# Patient Record
Sex: Female | Born: 1955 | Race: Black or African American | Hispanic: No | Smoking: Never smoker
Health system: Southern US, Community
[De-identification: ages and names within clinical notes are randomized; demographics above are authoritative.]

## PROBLEM LIST (undated history)

## (undated) DIAGNOSIS — E119 Type 2 diabetes mellitus without complications: Secondary | ICD-10-CM

## (undated) DIAGNOSIS — E785 Hyperlipidemia, unspecified: Secondary | ICD-10-CM

## (undated) DIAGNOSIS — I10 Essential (primary) hypertension: Secondary | ICD-10-CM

## (undated) HISTORY — PX: OTHER SURGICAL HISTORY: SHX169

## (undated) HISTORY — DX: Essential (primary) hypertension: I10

## (undated) HISTORY — DX: Hyperlipidemia, unspecified: E78.5

## (undated) HISTORY — PX: TUBAL LIGATION: SHX77

## (undated) HISTORY — DX: Type 2 diabetes mellitus without complications: E11.9

## (undated) HISTORY — PX: ABDOMINAL HYSTERECTOMY: SHX81

---

## 1998-01-11 ENCOUNTER — Emergency Department (HOSPITAL_COMMUNITY): Admission: EM | Admit: 1998-01-11 | Discharge: 1998-01-11 | Payer: Self-pay | Admitting: Emergency Medicine

## 1999-08-23 ENCOUNTER — Encounter: Payer: Self-pay | Admitting: Emergency Medicine

## 1999-08-23 ENCOUNTER — Inpatient Hospital Stay (HOSPITAL_COMMUNITY): Admission: EM | Admit: 1999-08-23 | Discharge: 1999-08-25 | Payer: Self-pay | Admitting: Emergency Medicine

## 1999-08-25 ENCOUNTER — Encounter: Payer: Self-pay | Admitting: Cardiovascular Disease

## 2000-10-19 ENCOUNTER — Emergency Department (HOSPITAL_COMMUNITY): Admission: EM | Admit: 2000-10-19 | Discharge: 2000-10-19 | Payer: Self-pay | Admitting: Emergency Medicine

## 2000-10-19 ENCOUNTER — Encounter: Payer: Self-pay | Admitting: Emergency Medicine

## 2000-12-18 ENCOUNTER — Encounter: Admission: RE | Admit: 2000-12-18 | Discharge: 2000-12-18 | Payer: Self-pay | Admitting: Family Medicine

## 2001-01-01 ENCOUNTER — Encounter: Admission: RE | Admit: 2001-01-01 | Discharge: 2001-01-01 | Payer: Self-pay | Admitting: Family Medicine

## 2001-01-31 ENCOUNTER — Encounter: Admission: RE | Admit: 2001-01-31 | Discharge: 2001-01-31 | Payer: Self-pay | Admitting: Family Medicine

## 2001-02-13 ENCOUNTER — Encounter: Admission: RE | Admit: 2001-02-13 | Discharge: 2001-02-13 | Payer: Self-pay | Admitting: Family Medicine

## 2001-03-17 ENCOUNTER — Encounter: Admission: RE | Admit: 2001-03-17 | Discharge: 2001-03-17 | Payer: Self-pay | Admitting: Family Medicine

## 2001-04-14 ENCOUNTER — Encounter: Admission: RE | Admit: 2001-04-14 | Discharge: 2001-04-14 | Payer: Self-pay | Admitting: Family Medicine

## 2001-06-05 ENCOUNTER — Encounter: Admission: RE | Admit: 2001-06-05 | Discharge: 2001-06-05 | Payer: Self-pay | Admitting: Family Medicine

## 2001-07-01 ENCOUNTER — Encounter: Admission: RE | Admit: 2001-07-01 | Discharge: 2001-07-01 | Payer: Self-pay | Admitting: Family Medicine

## 2001-07-02 ENCOUNTER — Encounter: Admission: RE | Admit: 2001-07-02 | Discharge: 2001-07-02 | Payer: Self-pay | Admitting: Family Medicine

## 2001-07-28 ENCOUNTER — Encounter: Admission: RE | Admit: 2001-07-28 | Discharge: 2001-07-28 | Payer: Self-pay | Admitting: Family Medicine

## 2001-08-01 ENCOUNTER — Encounter: Admission: RE | Admit: 2001-08-01 | Discharge: 2001-08-01 | Payer: Self-pay | Admitting: Family Medicine

## 2001-08-05 ENCOUNTER — Encounter: Admission: RE | Admit: 2001-08-05 | Discharge: 2001-08-05 | Payer: Self-pay | Admitting: *Deleted

## 2001-08-05 ENCOUNTER — Encounter: Payer: Self-pay | Admitting: *Deleted

## 2001-09-25 ENCOUNTER — Encounter: Admission: RE | Admit: 2001-09-25 | Discharge: 2001-09-25 | Payer: Self-pay | Admitting: Family Medicine

## 2001-10-21 ENCOUNTER — Encounter: Admission: RE | Admit: 2001-10-21 | Discharge: 2001-10-21 | Payer: Self-pay | Admitting: Family Medicine

## 2001-11-12 ENCOUNTER — Encounter: Admission: RE | Admit: 2001-11-12 | Discharge: 2001-11-12 | Payer: Self-pay | Admitting: Family Medicine

## 2001-12-16 ENCOUNTER — Encounter: Admission: RE | Admit: 2001-12-16 | Discharge: 2001-12-16 | Payer: Self-pay | Admitting: Family Medicine

## 2001-12-19 ENCOUNTER — Encounter: Payer: Self-pay | Admitting: Sports Medicine

## 2001-12-19 ENCOUNTER — Encounter: Admission: RE | Admit: 2001-12-19 | Discharge: 2001-12-19 | Payer: Self-pay | Admitting: Sports Medicine

## 2001-12-24 ENCOUNTER — Encounter: Admission: RE | Admit: 2001-12-24 | Discharge: 2001-12-24 | Payer: Self-pay | Admitting: Family Medicine

## 2002-01-27 ENCOUNTER — Encounter: Admission: RE | Admit: 2002-01-27 | Discharge: 2002-01-27 | Payer: Self-pay | Admitting: Family Medicine

## 2002-04-01 ENCOUNTER — Encounter: Admission: RE | Admit: 2002-04-01 | Discharge: 2002-04-01 | Payer: Self-pay | Admitting: Family Medicine

## 2002-04-13 ENCOUNTER — Emergency Department (HOSPITAL_COMMUNITY): Admission: EM | Admit: 2002-04-13 | Discharge: 2002-04-13 | Payer: Self-pay | Admitting: Emergency Medicine

## 2002-04-16 ENCOUNTER — Encounter: Payer: Self-pay | Admitting: Emergency Medicine

## 2002-04-16 ENCOUNTER — Emergency Department (HOSPITAL_COMMUNITY): Admission: EM | Admit: 2002-04-16 | Discharge: 2002-04-16 | Payer: Self-pay | Admitting: Emergency Medicine

## 2002-04-21 ENCOUNTER — Encounter: Admission: RE | Admit: 2002-04-21 | Discharge: 2002-04-21 | Payer: Self-pay | Admitting: Family Medicine

## 2002-04-21 ENCOUNTER — Ambulatory Visit (HOSPITAL_COMMUNITY): Admission: RE | Admit: 2002-04-21 | Discharge: 2002-04-21 | Payer: Self-pay | Admitting: Family Medicine

## 2002-05-06 ENCOUNTER — Encounter: Admission: RE | Admit: 2002-05-06 | Discharge: 2002-05-06 | Payer: Self-pay | Admitting: Family Medicine

## 2002-05-11 ENCOUNTER — Encounter: Admission: RE | Admit: 2002-05-11 | Discharge: 2002-05-11 | Payer: Self-pay | Admitting: Family Medicine

## 2002-05-27 ENCOUNTER — Encounter: Admission: RE | Admit: 2002-05-27 | Discharge: 2002-05-27 | Payer: Self-pay | Admitting: Family Medicine

## 2002-07-30 ENCOUNTER — Encounter: Admission: RE | Admit: 2002-07-30 | Discharge: 2002-07-30 | Payer: Self-pay | Admitting: Family Medicine

## 2002-08-10 ENCOUNTER — Encounter: Admission: RE | Admit: 2002-08-10 | Discharge: 2002-11-08 | Payer: Self-pay | Admitting: Family Medicine

## 2002-09-25 ENCOUNTER — Emergency Department (HOSPITAL_COMMUNITY): Admission: EM | Admit: 2002-09-25 | Discharge: 2002-09-25 | Payer: Self-pay

## 2002-09-28 ENCOUNTER — Encounter: Admission: RE | Admit: 2002-09-28 | Discharge: 2002-09-28 | Payer: Self-pay | Admitting: Family Medicine

## 2002-10-30 ENCOUNTER — Encounter: Admission: RE | Admit: 2002-10-30 | Discharge: 2002-10-30 | Payer: Self-pay | Admitting: Family Medicine

## 2002-11-05 ENCOUNTER — Encounter: Admission: RE | Admit: 2002-11-05 | Discharge: 2002-11-05 | Payer: Self-pay | Admitting: Family Medicine

## 2002-12-15 ENCOUNTER — Encounter: Admission: RE | Admit: 2002-12-15 | Discharge: 2002-12-15 | Payer: Self-pay | Admitting: Family Medicine

## 2002-12-24 ENCOUNTER — Encounter: Admission: RE | Admit: 2002-12-24 | Discharge: 2002-12-24 | Payer: Self-pay | Admitting: Sports Medicine

## 2002-12-24 ENCOUNTER — Encounter: Payer: Self-pay | Admitting: Sports Medicine

## 2002-12-26 ENCOUNTER — Emergency Department (HOSPITAL_COMMUNITY): Admission: EM | Admit: 2002-12-26 | Discharge: 2002-12-26 | Payer: Self-pay | Admitting: Emergency Medicine

## 2003-02-18 ENCOUNTER — Encounter: Admission: RE | Admit: 2003-02-18 | Discharge: 2003-02-18 | Payer: Self-pay | Admitting: Family Medicine

## 2003-10-18 ENCOUNTER — Encounter: Admission: RE | Admit: 2003-10-18 | Discharge: 2003-10-18 | Payer: Self-pay | Admitting: Family Medicine

## 2003-11-10 ENCOUNTER — Encounter: Admission: RE | Admit: 2003-11-10 | Discharge: 2003-11-10 | Payer: Self-pay | Admitting: Family Medicine

## 2003-12-22 ENCOUNTER — Encounter: Admission: RE | Admit: 2003-12-22 | Discharge: 2003-12-22 | Payer: Self-pay | Admitting: Family Medicine

## 2003-12-31 ENCOUNTER — Encounter: Admission: RE | Admit: 2003-12-31 | Discharge: 2003-12-31 | Payer: Self-pay | Admitting: Family Medicine

## 2004-02-21 ENCOUNTER — Ambulatory Visit: Payer: Self-pay | Admitting: Family Medicine

## 2004-02-29 ENCOUNTER — Encounter: Admission: RE | Admit: 2004-02-29 | Discharge: 2004-02-29 | Payer: Self-pay | Admitting: Sports Medicine

## 2004-03-26 ENCOUNTER — Emergency Department (HOSPITAL_COMMUNITY): Admission: EM | Admit: 2004-03-26 | Discharge: 2004-03-26 | Payer: Self-pay | Admitting: Internal Medicine

## 2004-05-09 ENCOUNTER — Ambulatory Visit: Payer: Self-pay | Admitting: Family Medicine

## 2004-05-22 ENCOUNTER — Ambulatory Visit: Payer: Self-pay | Admitting: Family Medicine

## 2004-07-18 ENCOUNTER — Ambulatory Visit: Payer: Self-pay | Admitting: Family Medicine

## 2004-07-22 ENCOUNTER — Emergency Department (HOSPITAL_COMMUNITY): Admission: AD | Admit: 2004-07-22 | Discharge: 2004-07-22 | Payer: Self-pay | Admitting: Family Medicine

## 2004-08-08 ENCOUNTER — Ambulatory Visit: Payer: Self-pay | Admitting: Sports Medicine

## 2004-09-01 ENCOUNTER — Ambulatory Visit: Payer: Self-pay | Admitting: Sports Medicine

## 2004-09-21 ENCOUNTER — Ambulatory Visit: Payer: Self-pay | Admitting: Sports Medicine

## 2004-11-15 ENCOUNTER — Ambulatory Visit: Payer: Self-pay | Admitting: Family Medicine

## 2005-02-21 ENCOUNTER — Ambulatory Visit: Payer: Self-pay | Admitting: Family Medicine

## 2005-03-04 ENCOUNTER — Emergency Department (HOSPITAL_COMMUNITY): Admission: EM | Admit: 2005-03-04 | Discharge: 2005-03-04 | Payer: Self-pay | Admitting: Family Medicine

## 2005-03-21 ENCOUNTER — Ambulatory Visit: Payer: Self-pay | Admitting: Family Medicine

## 2005-03-21 ENCOUNTER — Encounter: Admission: RE | Admit: 2005-03-21 | Discharge: 2005-03-21 | Payer: Self-pay | Admitting: Sports Medicine

## 2005-03-26 ENCOUNTER — Ambulatory Visit: Payer: Self-pay | Admitting: Sports Medicine

## 2005-03-29 ENCOUNTER — Ambulatory Visit: Payer: Self-pay | Admitting: Family Medicine

## 2005-04-18 ENCOUNTER — Ambulatory Visit: Payer: Self-pay | Admitting: Family Medicine

## 2005-07-05 ENCOUNTER — Ambulatory Visit: Payer: Self-pay | Admitting: Family Medicine

## 2005-07-12 ENCOUNTER — Encounter (INDEPENDENT_AMBULATORY_CARE_PROVIDER_SITE_OTHER): Payer: Self-pay | Admitting: *Deleted

## 2005-07-12 LAB — CONVERTED CEMR LAB: Pap Smear: NORMAL

## 2005-07-19 ENCOUNTER — Encounter: Admission: RE | Admit: 2005-07-19 | Discharge: 2005-07-19 | Payer: Self-pay | Admitting: Sports Medicine

## 2005-08-08 ENCOUNTER — Ambulatory Visit: Payer: Self-pay | Admitting: Sports Medicine

## 2005-10-10 ENCOUNTER — Ambulatory Visit: Payer: Self-pay | Admitting: Sports Medicine

## 2005-11-27 ENCOUNTER — Ambulatory Visit: Payer: Self-pay | Admitting: Sports Medicine

## 2006-01-08 ENCOUNTER — Emergency Department (HOSPITAL_COMMUNITY): Admission: EM | Admit: 2006-01-08 | Discharge: 2006-01-08 | Payer: Self-pay | Admitting: Emergency Medicine

## 2006-01-29 ENCOUNTER — Ambulatory Visit: Payer: Self-pay | Admitting: Family Medicine

## 2006-03-26 ENCOUNTER — Ambulatory Visit: Payer: Self-pay | Admitting: Family Medicine

## 2006-03-27 ENCOUNTER — Ambulatory Visit (HOSPITAL_COMMUNITY): Admission: RE | Admit: 2006-03-27 | Discharge: 2006-03-27 | Payer: Self-pay | Admitting: Family Medicine

## 2006-04-08 ENCOUNTER — Ambulatory Visit: Payer: Self-pay | Admitting: Sports Medicine

## 2006-07-02 ENCOUNTER — Ambulatory Visit (HOSPITAL_COMMUNITY): Admission: RE | Admit: 2006-07-02 | Discharge: 2006-07-02 | Payer: Self-pay | Admitting: Family Medicine

## 2006-07-02 ENCOUNTER — Ambulatory Visit: Payer: Self-pay | Admitting: Family Medicine

## 2006-08-05 ENCOUNTER — Ambulatory Visit: Payer: Self-pay | Admitting: Family Medicine

## 2006-08-08 DIAGNOSIS — E119 Type 2 diabetes mellitus without complications: Secondary | ICD-10-CM | POA: Insufficient documentation

## 2006-08-08 DIAGNOSIS — I10 Essential (primary) hypertension: Secondary | ICD-10-CM | POA: Insufficient documentation

## 2006-08-08 DIAGNOSIS — E669 Obesity, unspecified: Secondary | ICD-10-CM | POA: Insufficient documentation

## 2006-08-08 DIAGNOSIS — G43909 Migraine, unspecified, not intractable, without status migrainosus: Secondary | ICD-10-CM | POA: Insufficient documentation

## 2006-08-09 ENCOUNTER — Encounter (INDEPENDENT_AMBULATORY_CARE_PROVIDER_SITE_OTHER): Payer: Self-pay | Admitting: *Deleted

## 2006-08-19 ENCOUNTER — Telehealth (INDEPENDENT_AMBULATORY_CARE_PROVIDER_SITE_OTHER): Payer: Self-pay | Admitting: Family Medicine

## 2006-11-25 ENCOUNTER — Telehealth (INDEPENDENT_AMBULATORY_CARE_PROVIDER_SITE_OTHER): Payer: Self-pay | Admitting: *Deleted

## 2006-12-12 ENCOUNTER — Ambulatory Visit: Payer: Self-pay | Admitting: Family Medicine

## 2006-12-12 ENCOUNTER — Encounter (INDEPENDENT_AMBULATORY_CARE_PROVIDER_SITE_OTHER): Payer: Self-pay | Admitting: Family Medicine

## 2006-12-12 ENCOUNTER — Telehealth (INDEPENDENT_AMBULATORY_CARE_PROVIDER_SITE_OTHER): Payer: Self-pay | Admitting: Family Medicine

## 2006-12-12 DIAGNOSIS — M216X9 Other acquired deformities of unspecified foot: Secondary | ICD-10-CM | POA: Insufficient documentation

## 2006-12-12 LAB — CONVERTED CEMR LAB
AST: 12 units/L (ref 0–37)
Alkaline Phosphatase: 54 units/L (ref 39–117)
CO2: 29 meq/L (ref 19–32)
Calcium: 9.3 mg/dL (ref 8.4–10.5)
Chloride: 103 meq/L (ref 96–112)
Creatinine, Ser: 0.77 mg/dL (ref 0.40–1.20)
Glucose, Bld: 129 mg/dL — ABNORMAL HIGH (ref 70–99)
LDL Cholesterol: 113 mg/dL — ABNORMAL HIGH (ref 0–99)
Potassium: 4.1 meq/L (ref 3.5–5.3)
Sodium: 139 meq/L (ref 135–145)
Total Bilirubin: 0.8 mg/dL (ref 0.3–1.2)
Total Protein: 7.6 g/dL (ref 6.0–8.3)
VLDL: 14 mg/dL (ref 0–40)

## 2007-01-15 ENCOUNTER — Ambulatory Visit: Payer: Self-pay | Admitting: Family Medicine

## 2007-01-29 ENCOUNTER — Ambulatory Visit: Payer: Self-pay | Admitting: Family Medicine

## 2007-01-30 ENCOUNTER — Encounter (INDEPENDENT_AMBULATORY_CARE_PROVIDER_SITE_OTHER): Payer: Self-pay | Admitting: Family Medicine

## 2007-02-18 ENCOUNTER — Emergency Department (HOSPITAL_COMMUNITY): Admission: EM | Admit: 2007-02-18 | Discharge: 2007-02-18 | Payer: Self-pay | Admitting: Family Medicine

## 2007-03-13 ENCOUNTER — Ambulatory Visit: Payer: Self-pay | Admitting: Family Medicine

## 2007-03-13 DIAGNOSIS — E785 Hyperlipidemia, unspecified: Secondary | ICD-10-CM | POA: Insufficient documentation

## 2007-04-24 ENCOUNTER — Encounter (INDEPENDENT_AMBULATORY_CARE_PROVIDER_SITE_OTHER): Payer: Self-pay | Admitting: Family Medicine

## 2007-04-24 LAB — CONVERTED CEMR LAB: LDL Goal: 100 mg/dL

## 2007-05-22 ENCOUNTER — Ambulatory Visit: Payer: Self-pay | Admitting: Family Medicine

## 2007-05-22 ENCOUNTER — Encounter (INDEPENDENT_AMBULATORY_CARE_PROVIDER_SITE_OTHER): Payer: Self-pay | Admitting: Family Medicine

## 2007-05-22 LAB — CONVERTED CEMR LAB
ALT: 9 units/L (ref 0–35)
AST: 13 units/L (ref 0–37)
Direct LDL: 80 mg/dL
Glucose, Bld: 101 mg/dL — ABNORMAL HIGH (ref 70–99)
Hgb A1c MFr Bld: 6.7 %
LDL Cholesterol: 80 mg/dL
Microalbumin U total vol: NEGATIVE mg/L
Potassium: 4.4 meq/L (ref 3.5–5.3)
Total Protein: 7.4 g/dL (ref 6.0–8.3)

## 2007-05-23 ENCOUNTER — Encounter (INDEPENDENT_AMBULATORY_CARE_PROVIDER_SITE_OTHER): Payer: Self-pay | Admitting: Family Medicine

## 2007-06-02 ENCOUNTER — Telehealth (INDEPENDENT_AMBULATORY_CARE_PROVIDER_SITE_OTHER): Payer: Self-pay | Admitting: Family Medicine

## 2007-06-11 ENCOUNTER — Ambulatory Visit: Payer: Self-pay | Admitting: Family Medicine

## 2007-06-26 ENCOUNTER — Emergency Department (HOSPITAL_COMMUNITY): Admission: EM | Admit: 2007-06-26 | Discharge: 2007-06-26 | Payer: Self-pay | Admitting: Emergency Medicine

## 2007-08-25 ENCOUNTER — Encounter (INDEPENDENT_AMBULATORY_CARE_PROVIDER_SITE_OTHER): Payer: Self-pay | Admitting: Family Medicine

## 2007-08-25 ENCOUNTER — Ambulatory Visit: Payer: Self-pay | Admitting: Sports Medicine

## 2007-08-25 LAB — CONVERTED CEMR LAB
ALT: 10 units/L (ref 0–35)
CRP, High Sensitivity: 10.7 — ABNORMAL HIGH
Creatinine, Ser: 0.67 mg/dL (ref 0.40–1.20)
HCT: 35 % — ABNORMAL LOW (ref 36.0–46.0)
Hgb A1c MFr Bld: 6.6 %
MCHC: 32.9 g/dL (ref 30.0–36.0)
MCV: 82.9 fL (ref 78.0–100.0)
Platelets: 304 10*3/uL (ref 150–400)
Potassium: 4.2 meq/L (ref 3.5–5.3)
RBC: 4.22 M/uL (ref 3.87–5.11)
Sed Rate: 24 mm/hr — ABNORMAL HIGH (ref 0–22)
Uric Acid, Serum: 3.2 mg/dL (ref 2.4–7.0)
WBC: 4.8 10*3/uL (ref 4.0–10.5)

## 2007-08-27 ENCOUNTER — Encounter: Admission: RE | Admit: 2007-08-27 | Discharge: 2007-08-27 | Payer: Self-pay | Admitting: Family Medicine

## 2007-09-01 ENCOUNTER — Ambulatory Visit: Payer: Self-pay | Admitting: Family Medicine

## 2007-09-25 ENCOUNTER — Ambulatory Visit: Payer: Self-pay | Admitting: Sports Medicine

## 2007-11-06 ENCOUNTER — Encounter (INDEPENDENT_AMBULATORY_CARE_PROVIDER_SITE_OTHER): Payer: Self-pay | Admitting: *Deleted

## 2007-11-25 ENCOUNTER — Encounter (INDEPENDENT_AMBULATORY_CARE_PROVIDER_SITE_OTHER): Payer: Self-pay | Admitting: Family Medicine

## 2007-12-03 ENCOUNTER — Telehealth: Payer: Self-pay | Admitting: *Deleted

## 2008-01-08 ENCOUNTER — Encounter: Payer: Self-pay | Admitting: Sports Medicine

## 2008-01-08 ENCOUNTER — Ambulatory Visit: Payer: Self-pay | Admitting: Family Medicine

## 2008-01-08 LAB — CONVERTED CEMR LAB
ALT: 11 units/L (ref 0–35)
AST: 12 units/L (ref 0–37)
Albumin: 4.1 g/dL (ref 3.5–5.2)
Alkaline Phosphatase: 49 units/L (ref 39–117)
Amylase: 54 U/L (ref 0–105)
BUN: 13 mg/dL (ref 6–23)
Bilirubin, Direct: 0.1 mg/dL (ref 0.0–0.3)
CO2: 25 meq/L (ref 19–32)
Calcium: 9.4 mg/dL (ref 8.4–10.5)
Chloride: 103 meq/L (ref 96–112)
Creatinine, Ser: 0.61 mg/dL (ref 0.40–1.20)
Glucose, Bld: 162 mg/dL — ABNORMAL HIGH (ref 70–99)
HCT: 33.9 % — ABNORMAL LOW (ref 36.0–46.0)
Hemoglobin: 11.4 g/dL
Hemoglobin: 11.6 g/dL — ABNORMAL LOW (ref 12.0–15.0)
Indirect Bilirubin: 0.4 mg/dL (ref 0.0–0.9)
Lipase: 21 U/L (ref 0–75)
MCHC: 34.2 g/dL (ref 30.0–36.0)
MCV: 79.6 fL (ref 78.0–100.0)
Platelets: 293 K/uL (ref 150–400)
Potassium: 3.9 meq/L (ref 3.5–5.3)
RBC: 4.26 M/uL (ref 3.87–5.11)
RDW: 13.1 % (ref 11.5–15.5)
Sodium: 140 meq/L (ref 135–145)
Total Bilirubin: 0.5 mg/dL (ref 0.3–1.2)
Total Protein: 7.6 g/dL (ref 6.0–8.3)
WBC: 5.3 10*3/uL (ref 4.0–10.5)

## 2008-01-12 ENCOUNTER — Telehealth: Payer: Self-pay | Admitting: Sports Medicine

## 2008-01-12 ENCOUNTER — Telehealth: Payer: Self-pay | Admitting: *Deleted

## 2008-01-20 ENCOUNTER — Ambulatory Visit: Payer: Self-pay | Admitting: Family Medicine

## 2008-01-21 ENCOUNTER — Telehealth: Payer: Self-pay | Admitting: Sports Medicine

## 2008-01-21 ENCOUNTER — Encounter (INDEPENDENT_AMBULATORY_CARE_PROVIDER_SITE_OTHER): Payer: Self-pay | Admitting: *Deleted

## 2008-01-28 ENCOUNTER — Telehealth: Payer: Self-pay | Admitting: *Deleted

## 2008-01-30 ENCOUNTER — Ambulatory Visit: Payer: Self-pay | Admitting: Family Medicine

## 2008-01-30 ENCOUNTER — Encounter: Payer: Self-pay | Admitting: Sports Medicine

## 2008-01-30 LAB — CONVERTED CEMR LAB
Cholesterol: 137 mg/dL (ref 0–200)
Ferritin: 128 ng/mL (ref 10–291)
HDL: 55 mg/dL (ref >39)
Hgb A1c MFr Bld: 6.7 %
LDL Cholesterol: 70 mg/dL (ref 0–99)
Total CHOL/HDL Ratio: 2.5
Triglycerides: 61 mg/dL (ref <150)
VLDL: 12 mg/dL (ref 0–40)

## 2008-02-18 ENCOUNTER — Encounter: Payer: Self-pay | Admitting: Sports Medicine

## 2008-02-18 ENCOUNTER — Ambulatory Visit: Payer: Self-pay | Admitting: Family Medicine

## 2008-02-25 ENCOUNTER — Ambulatory Visit: Payer: Self-pay | Admitting: Family Medicine

## 2008-02-25 ENCOUNTER — Telehealth: Payer: Self-pay | Admitting: *Deleted

## 2008-02-25 DIAGNOSIS — K219 Gastro-esophageal reflux disease without esophagitis: Secondary | ICD-10-CM | POA: Insufficient documentation

## 2008-03-04 ENCOUNTER — Ambulatory Visit: Payer: Self-pay | Admitting: Family Medicine

## 2008-03-04 ENCOUNTER — Ambulatory Visit: Payer: Self-pay | Admitting: Cardiovascular Disease

## 2008-03-04 ENCOUNTER — Observation Stay (HOSPITAL_COMMUNITY): Admission: EM | Admit: 2008-03-04 | Discharge: 2008-03-05 | Payer: Self-pay | Admitting: Emergency Medicine

## 2008-03-08 ENCOUNTER — Telehealth: Payer: Self-pay | Admitting: *Deleted

## 2008-03-19 ENCOUNTER — Ambulatory Visit: Payer: Self-pay | Admitting: Family Medicine

## 2008-03-19 LAB — CONVERTED CEMR LAB

## 2008-03-29 ENCOUNTER — Telehealth: Payer: Self-pay | Admitting: Sports Medicine

## 2008-04-08 ENCOUNTER — Telehealth: Payer: Self-pay | Admitting: Sports Medicine

## 2008-04-19 ENCOUNTER — Telehealth: Payer: Self-pay | Admitting: Sports Medicine

## 2008-04-21 ENCOUNTER — Ambulatory Visit (HOSPITAL_COMMUNITY): Admission: RE | Admit: 2008-04-21 | Discharge: 2008-04-21 | Payer: Self-pay | Admitting: Sports Medicine

## 2008-07-21 ENCOUNTER — Telehealth (INDEPENDENT_AMBULATORY_CARE_PROVIDER_SITE_OTHER): Payer: Self-pay | Admitting: *Deleted

## 2008-07-22 ENCOUNTER — Emergency Department (HOSPITAL_COMMUNITY): Admission: EM | Admit: 2008-07-22 | Discharge: 2008-07-22 | Payer: Self-pay | Admitting: Emergency Medicine

## 2008-08-11 ENCOUNTER — Ambulatory Visit: Payer: Self-pay | Admitting: Family Medicine

## 2008-08-26 ENCOUNTER — Encounter: Payer: Self-pay | Admitting: Sports Medicine

## 2008-08-26 ENCOUNTER — Ambulatory Visit: Payer: Self-pay | Admitting: Family Medicine

## 2008-08-26 LAB — CONVERTED CEMR LAB: Uric Acid, Serum: 3.3 mg/dL (ref 2.4–7.0)

## 2008-09-09 ENCOUNTER — Encounter: Payer: Self-pay | Admitting: Sports Medicine

## 2008-09-13 ENCOUNTER — Encounter: Payer: Self-pay | Admitting: Sports Medicine

## 2008-12-06 ENCOUNTER — Ambulatory Visit: Payer: Self-pay | Admitting: Family Medicine

## 2008-12-06 LAB — CONVERTED CEMR LAB
Hemoglobin: 12 g/dL
Hgb A1c MFr Bld: 6.7 %
Microalbumin U total vol: NORMAL mg/L

## 2008-12-16 ENCOUNTER — Telehealth: Payer: Self-pay | Admitting: Sports Medicine

## 2009-02-08 ENCOUNTER — Ambulatory Visit: Payer: Self-pay | Admitting: Family Medicine

## 2009-02-14 ENCOUNTER — Observation Stay (HOSPITAL_COMMUNITY): Admission: EM | Admit: 2009-02-14 | Discharge: 2009-02-15 | Payer: Self-pay | Admitting: Emergency Medicine

## 2009-02-14 ENCOUNTER — Emergency Department (HOSPITAL_COMMUNITY): Admission: EM | Admit: 2009-02-14 | Discharge: 2009-02-14 | Payer: Self-pay | Admitting: Family Medicine

## 2009-02-14 ENCOUNTER — Ambulatory Visit: Payer: Self-pay | Admitting: Family Medicine

## 2009-02-15 ENCOUNTER — Encounter: Payer: Self-pay | Admitting: Sports Medicine

## 2009-02-15 LAB — CONVERTED CEMR LAB
Cholesterol: 178 mg/dL
HDL: 47 mg/dL
LDL Cholesterol: 115 mg/dL
Triglycerides: 78 mg/dL

## 2009-02-22 ENCOUNTER — Encounter: Payer: Self-pay | Admitting: Sports Medicine

## 2009-02-22 ENCOUNTER — Ambulatory Visit: Payer: Self-pay | Admitting: Family Medicine

## 2009-02-22 LAB — CONVERTED CEMR LAB: Hgb A1c MFr Bld: 6.9 %

## 2009-02-23 LAB — CONVERTED CEMR LAB: Vit D, 25-Hydroxy: 26 ng/mL — ABNORMAL LOW (ref 30–89)

## 2009-03-10 ENCOUNTER — Telehealth: Payer: Self-pay | Admitting: Family Medicine

## 2009-03-21 ENCOUNTER — Telehealth (INDEPENDENT_AMBULATORY_CARE_PROVIDER_SITE_OTHER): Payer: Self-pay | Admitting: *Deleted

## 2009-08-17 ENCOUNTER — Ambulatory Visit (HOSPITAL_COMMUNITY): Admission: RE | Admit: 2009-08-17 | Discharge: 2009-08-17 | Payer: Self-pay | Admitting: Sports Medicine

## 2009-08-23 ENCOUNTER — Encounter: Payer: Self-pay | Admitting: Sports Medicine

## 2009-08-25 ENCOUNTER — Encounter: Payer: Self-pay | Admitting: Sports Medicine

## 2010-01-10 ENCOUNTER — Encounter: Payer: Self-pay | Admitting: Sports Medicine

## 2010-01-10 ENCOUNTER — Ambulatory Visit: Payer: Self-pay | Admitting: Family Medicine

## 2010-01-10 DIAGNOSIS — F329 Major depressive disorder, single episode, unspecified: Secondary | ICD-10-CM | POA: Insufficient documentation

## 2010-01-10 LAB — CONVERTED CEMR LAB
HCT: 33.7 % — ABNORMAL LOW (ref 36.0–46.0)
Hemoglobin: 10.8 g/dL
Hemoglobin: 10.7 g/dL — ABNORMAL LOW (ref 12.0–15.0)
Hgb A1c MFr Bld: 7.2 %
Iron: 59 ug/dL (ref 42–145)
MCHC: 31.8 g/dL (ref 30.0–36.0)
MCV: 82.6 fL (ref 78.0–100.0)
Platelets: 302 K/uL (ref 150–400)
RBC: 4.08 M/uL (ref 3.87–5.11)
RDW: 14.4 % (ref 11.5–15.5)
Retic Ct Pct: 0.9 % (ref 0.4–3.1)
Saturation Ratios: 18 % — ABNORMAL LOW (ref 20–55)
TIBC: 321 ug/dL (ref 250–470)
UIBC: 262 ug/dL
WBC: 5 10*3/microliter (ref 4.0–10.5)

## 2010-01-25 ENCOUNTER — Ambulatory Visit: Payer: Self-pay | Admitting: Family Medicine

## 2010-01-26 ENCOUNTER — Telehealth: Payer: Self-pay | Admitting: Sports Medicine

## 2010-03-10 ENCOUNTER — Telehealth: Payer: Self-pay | Admitting: *Deleted

## 2010-04-03 ENCOUNTER — Encounter: Payer: Self-pay | Admitting: Sports Medicine

## 2010-04-03 ENCOUNTER — Ambulatory Visit: Payer: Self-pay | Admitting: Family Medicine

## 2010-04-03 LAB — CONVERTED CEMR LAB: Hgb A1c MFr Bld: 7.1 %

## 2010-04-04 ENCOUNTER — Encounter: Payer: Self-pay | Admitting: Sports Medicine

## 2010-04-04 LAB — CONVERTED CEMR LAB
ALT: 12 U/L (ref 0–35)
AST: 11 U/L (ref 0–37)
Albumin: 4.1 g/dL (ref 3.5–5.2)
Alkaline Phosphatase: 53 U/L (ref 39–117)
BUN: 15 mg/dL (ref 6–23)
CO2: 28 meq/L (ref 19–32)
Calcium: 9.5 mg/dL (ref 8.4–10.5)
Chloride: 102 meq/L (ref 96–112)
Creatinine, Ser: 0.73 mg/dL (ref 0.40–1.20)
Direct LDL: 111 mg/dL — ABNORMAL HIGH
Glucose, Bld: 145 mg/dL — ABNORMAL HIGH (ref 70–99)
Potassium: 4.3 meq/L (ref 3.5–5.3)
Sodium: 140 meq/L (ref 135–145)
Total Bilirubin: 0.5 mg/dL (ref 0.3–1.2)
Total CK: 101 units/L (ref 7–177)
Total Protein: 7.7 g/dL (ref 6.0–8.3)

## 2010-04-07 ENCOUNTER — Telehealth: Payer: Self-pay | Admitting: *Deleted

## 2010-04-13 ENCOUNTER — Telehealth: Payer: Self-pay | Admitting: Sports Medicine

## 2010-04-17 ENCOUNTER — Encounter: Payer: Self-pay | Admitting: Sports Medicine

## 2010-04-21 ENCOUNTER — Telehealth: Payer: Self-pay | Admitting: Sports Medicine

## 2010-05-08 ENCOUNTER — Ambulatory Visit: Payer: Self-pay | Admitting: Family Medicine

## 2010-07-02 ENCOUNTER — Encounter: Payer: Self-pay | Admitting: Sports Medicine

## 2010-07-07 ENCOUNTER — Ambulatory Visit: Admission: RE | Admit: 2010-07-07 | Discharge: 2010-07-07 | Payer: Self-pay | Source: Home / Self Care

## 2010-07-07 ENCOUNTER — Encounter: Payer: Self-pay | Admitting: Psychology

## 2010-07-11 NOTE — Assessment & Plan Note (Signed)
 Summary: F/U VISIT/[facility]   Vital Signs:  Patient Profile:   55 Years Old Female Weight:      228.3 pounds Temp:     98.7 degrees F Pulse rate:   93 / minute BP sitting:   118 / 72  (right arm)  Pt. in pain?   yes    Location:   low back    Intensity:   5    Type:       pulling sensation  Vitals Entered By: AVELINA SHARPS RN (February 18, 2008 1:41 PM)              Is Patient Diabetic? Yes     hemmocult card given. AVELINA SHARPS RN  February 18, 2008 1:57 PM mammogram scholarship faxed to [facility].  [facility] RN  February 18, 2008 2:01 PM   PCP:  DEBBY PETTIES MD   History of Present Illness: 55 year old female with PMHX obesity, HTN, HLD, GERD doing well, only c/o pain in right lower back.  Occurred when patient was lifting something heavy the previous day.  aching.  Pain is mild and is not radiating.  hurts when palpating over paraspinal musculature.  No issues with bowel/bladder function, no numbness or tingling, no weakness.  No difficulty with walking.  Has been using Advil  and this seems to help somewhat.      Current Allergies: CODEINE  PHOSPHATE (CODEINE  PHOSPHATE)  Past Medical History:    Reviewed history from 01/08/2008 and no changes required:       2/03 - nl Tsh & free T4, 3/03 - thyroid U/S - mild diffuse enlargement of, 7/04- Cr 0.7, both lobes without discrete solid or cystic masses, Enlarged thyroid, hyperopic astigmatism and presbyopia, retrocalcaneal bursitis              H. Pylori positive  Past Surgical History:    Reviewed history from 08/08/2006 and no changes required:       stress test 1/04- 70% EF, poor ex. Tolerance - 07/30/2002, total  hysterectomy - 95 - 12/18/2000   Family History:    Reviewed history from 08/08/2006 and no changes required:       father died of MI at 19;, mother and brother with DM-2;  Social History:    Reviewed history from 08/08/2006 and no changes required:       lives with husband, and her son;  works in housekeeping at [employer]; poor diet; no etoh/drugs/cigs;; changed name (2003) [name]-> Loreli; Job causing headaches, anxiet, dizziness.; Leaving her husband 9-05?  Son in [place].    Ethnicity:  Black   Risk Factors:  Passive smoke exposure:  no Drug use:  no HIV high-risk behavior:  no   Review of Systems       As above in HPI   Physical Exam  General:     Well-developed,well-nourished,in no acute distress; alert,appropriate and cooperative throughout examination Head:     Normocephalic and atraumatic without obvious abnormalities. Eyes:     vision grossly intact, pupils equal, pupils round, pupils reactive to light, and no injection.   Ears:     R ear normal, L ear normal, and no external deformities.   Nose:     no external deformity, no external erythema, and no nasal discharge.   Neck:     No deformities, masses, or tenderness noted. Lungs:     Normal respiratory effort, chest expands symmetrically. Lungs are clear to auscultation, no crackles or wheezes. Heart:  Normal rate and regular rhythm. S1 and S2 normal without gallop, murmur, click, rub or other extra sounds. Abdomen:     Bowel sounds positive,abdomen soft and non-tender without masses, organomegaly or hernias noted. Msk:     No deformity or scoliosis noted of thoracic or lumbar spine.   Extremities:     No clubbing, cyanosis, edema, or deformity noted with normal full range of motion of all joints.   Neurologic:     Station and gait are normal. Plantar reflexes are down-going bilaterally. DTRs are symmetrical throughout. Sensory, motor and coordinative functions appear intact.    Impression & Recommendations:  Problem # 1:  LOW BACK PAIN, ACUTE (ICD-724.2) Likely paraspinal muscle strain.  Pt to take analgesics and rest lower back, can also use ICE for swelling or Bengay.  Also instructed to wear back lifting brace at all times when expecting to lift heavy loads.  Will return in 2 weeks to  reassess pain.  If worse will consider imaging of lumbar spine.  Problem # 2:  OBESITY, NOS (ICD-278.00) Discussed weight loss plans and diets with patient.  Will refer to nutrition if patient does not improve within the next couple of months.  Will follow weights.  Problem # 3:  SCREENING MAMMOGRAM NEC (ICD-V76.12) Ordered, will follow.  Problem # 4:  HYPERTENSION, BENIGN SYSTEMIC (ICD-401.1) Stable  Her updated medication list for this problem includes:    Lisinopril -hydrochlorothiazide  20-12.5 Mg Tabs (Lisinopril -hydrochlorothiazide ) .[name]. Take one tablet daily for blood pressure   Problem # 5:  HYPERLIPIDEMIA (ICD-272.4) Checked lipid panel today.  Her updated medication list for this problem includes:    Pravachol  80 Mg Tabs (Pravastatin  sodium) ..... One tablet daily at bedtime for cholesterol control   Problem # 6:  DIABETES MELLITUS II, UNCOMPLICATED (ICD-250.00) Hb A1c ok.   Her updated medication list for this problem includes:    Lisinopril -hydrochlorothiazide  20-12.5 Mg Tabs (Lisinopril -hydrochlorothiazide ) .[name]. Take one tablet daily for blood pressure  Orders: [facility]- Est  Level 4 (00785)   Problem # 7:  Preventive Health Care (ICD-V70.0) Colonoscopy done, normal, next in 10 years, Tri-hemocults given to patientfor yearly checks.  Complete Medication List: 1)  Lisinopril -hydrochlorothiazide  20-12.5 Mg Tabs (Lisinopril -hydrochlorothiazide ) .... Take one tablet daily for blood pressure 2)  Pravachol  80 Mg Tabs (Pravastatin  sodium) .... One tablet daily at bedtime for cholesterol control 3)  Ranitidine  Hcl 150 Mg Caps (Ranitidine  hcl) .... Take one cap at bedtime for 6 weeks.   Patient Instructions: 1)  Good to see you today.  It sounds as though you have had a muscle strain in your lower back, likely from improper lifting techniques or from overuse.  You will need to take it easy, and wear your back brace to work from now on.  Also take advil  as needed.  I also  want you to eat healthier, I will print out some handouts for you. 2)  You have a mammogram scheduled and tri-hemoccult cards that you can send back. 3)  Have the nurses schedule you to come back and see me in 2 weeks to reassess your back pain. 4)  You need to lose weight. Consider a lower calorie diet and regular exercise.    ]  Vital Signs:  Patient Profile:   55 Years Old Female Weight:      228.3 pounds Temp:     98.7 degrees F Pulse rate:   93 / minute BP sitting:   118 / 72    Location:   low  back    Intensity:   5    Type:       pulling sensation

## 2010-07-11 NOTE — Progress Notes (Signed)
 Summary: Test Results/call the pt, please....TS  Phone Note Call from Patient Call back at [phone]   Reason for Call: Talk to Nurse, Lab or Test Results Summary of Call: pt requesting to speak with Delores re: some test results, sts she was suppose to call Friday Initial call taken by: ERIN ODELL,  January 12, 2008 8:59 AM  Follow-up for Phone Call        called pt and advised that dr.Xiana Carns will call her back in re: labs. pt wants the dr. to know, that she has no insurance for her medications and prilosec cost $170. unable to buy it. please change. Follow-up by: JACK BLOODGOOD CMA,,  January 12, 2008 10:19 AM    New/Updated Medications: RANITIDINE  HCL 150 MG  CAPS (RANITIDINE  HCL) take one cap at bedtime for 6 weeks.   Prescriptions: RANITIDINE  HCL 150 MG  CAPS (RANITIDINE  HCL) take one cap at bedtime for 6 weeks.  #14 x 3   Entered and Authorized by:   DEBBY PETTIES MD   Signed by:   DEBBY PETTIES MD on 01/12/2008   Method used:   Faxed to ...       [facility]       [address]       [place], [place]  [address]       Ph: [phone]       Fax: [phone]   RxID:   [id number]

## 2010-07-11 NOTE — Letter (Signed)
 Summary: [facility]   Imported By: Karna Seminole 09/21/2008 15:25:15  _____________________________________________________________________  External Attachment:    Type:   Image     Comment:   External Document

## 2010-07-11 NOTE — Progress Notes (Signed)
  Phone Note Call from Patient   Caller: Patient Call For: [phone] Summary of Call: Pt has been taking Metformin for one week and have been having headaches ever since.  She is aware that this is one of the effects, but she don't think it should be going on this long.  The cholesterol meds she is prescibed now [facility], and cannot afford from regular pharmacy.  Need to give something comparable and call [facility] the [facility]. Initial call taken by: Abundio Miu,  April 13, 2010 9:12 AM  Follow-up for Phone Call        will forward message to MD. Follow-up by: Theresia Lo RN,  April 13, 2010 9:24 AM  Additional Follow-up for Phone Call Additional follow up Details #1::        Called [facility], the only other antihyperlipidemic avail is zetia and statins.  Statins are intolerable, zetia has no mortality benefit, she needs to go back to [facility] pharmacy and ask them to order the medication (which they said is possible) Additional Follow-up by: Rodney Langton MD,  April 13, 2010 9:40 AM    Additional Follow-up for Phone Call Additional follow up Details #2::    patient notified to [facility] and ask them to order med.  will forward message back to MD to address metformin concern. Follow-up by: Theresia Lo RN,  April 13, 2010 1:36 PM  Additional Follow-up for Phone Call Additional follow up Details #3:: Details for Additional Follow-up Action Taken: Continue metformin until makes appt with me. Try tylenol 500 q6h for HA. Additional Follow-up by: Rodney Langton MD,  April 13, 2010 1:38 PM  above message left on voicemail. Theresia Lo RN  April 13, 2010 4:46 PM

## 2010-07-11 NOTE — Progress Notes (Signed)
Summary: triage  Phone Note Call from Patient Call back at [phone]   Caller: Patient Summary of Call: Pt is itching all over. Initial call taken by: Clydell Hakim,  March 10, 2010 2:18 PM  Follow-up for Phone Call        Asked her if she had tried the Hydroxyzine that Dr. Karie Schwalbe prescribed her for the itching and she said no, that she was also having problems with muscle weakness and was afraid to take any additional meds (has been c/o muscle weakness since starting the Pravacol in August).  Told her to take the Hydroxyzine and stop the Pravacol for now.  Appt made with Dr. Karie Schwalbe for next [facility].  Told her I would route this note to him and he would call her if he had different/additional advise. Follow-up by: Dennison Nancy RN,  March 10, 2010 2:31 PM     Appended Document: triage Noted, agree with above recommendations, will discuss with her at office visit.

## 2010-07-11 NOTE — Assessment & Plan Note (Signed)
Summary: f/u per dr t/eo   Vital Signs:  Patient profile:   55 year old female Weight:      236.9 pounds Temp:     97.8 degrees F oral Pulse rate:   86 / minute Pulse rhythm:   regular BP sitting:   118 / 85  (left arm) Cuff size:   large  Vitals Entered By: Loralee Pacas CMA (January 25, 2010 8:47 AM)  Primary Care Provider:  Rodney Langton MD   History of Present Illness: 55 yo female here for fu.  Depression: Celexa for 2 wks now, no change, no side effects from medication.  PHQ9 noted.  Anemia: With melena symptoms.  Awaiting stool cards.  Pt refuses colonoscopy.  Symptoms unchanged.  Pt agreeable to take iron.  Current Medications (verified): 1)  Lisinopril-Hydrochlorothiazide 20-12.5 Mg  Tabs (Lisinopril-Hydrochlorothiazide) .... Take One Tablet Daily For Blood Pressure 2)  Pravachol 80 Mg  Tabs (Pravastatin Sodium) .... One Tablet Daily At Bedtime For Cholesterol Control 3)  Celexa 10 Mg Tabs (Citalopram Hydrobromide) .... One Tab By Mouth Qhs For A Week, Then 2 Tabs By Mouth Qhs 4)  Ferrous Sulfate 325 (65 Fe) Mg Tbec (Ferrous Sulfate) .... One Tab By Mouth Tid 5)  Colace 100 Mg Caps (Docusate Sodium) .... One By Mouth Two Times A Day As Needed For Constipation. 6)  Hydroxyzine Hcl 25 Mg Tabs (Hydroxyzine Hcl) .... One Tab By Mouth Qhs  Allergies (verified): 1)  Codeine Phosphate (Codeine Phosphate)  Past History:  Past Medical History: Anemia, post menopausal, refusing colonoscopy, risks discussed.  2/03 - nl Tsh & free T4, 3/03 - thyroid U/S - mild diffuse enlargement of, 7/04- Cr 0.7, both lobes without discrete solid or cystic masses, Enlarged thyroid, hyperopic astigmatism and presbyopia, retrocalcaneal bursitis  H. Pylori positive  Review of Systems       See HPI  Physical Exam  General:  Well-developed,well-nourished,in no acute distress; alert,appropriate and cooperative throughout examination Lungs:  Normal respiratory effort, chest expands  symmetrically. Lungs are clear to auscultation, no crackles or wheezes. Heart:  Normal rate and regular rhythm. S1 and S2 normal without gallop, murmur, click, rub or other extra sounds. Abdomen:  Bowel sounds positive,abdomen soft and non-tender without masses, organomegaly or hernias noted. Psych:  Cognition and judgment appear intact. Alert and cooperative with normal attention span and concentration. No apparent delusions, illusions, hallucinations Additional Exam:  PHQ-9: 14: Moderate depression.   Impression & Recommendations:  Problem # 1:  DEPRESSION, MAJOR (ICD-296.20) Assessment Unchanged Moderate depression per PHQ-9.  recheck at every visit. Cont celexa, explained that SSRIs could take 4-6 wks to work.  Pt agreeable to continue. RTC 4 weeks to reassess.  Refilled celexa.  Orders: [facility]- Est  Level 4 ([address])  Problem # 2:  ANEMIA (ICD-285.9) Assessment: Unchanged Pt refusing colonoscopy.  explained risks and possibility of cancer as a dx, she will think about it. Stool cards sent to lab. Starting FeSO4 325 three times a day. with meals. Colace as needed. RTC 4 weeks.  Her updated medication list for this problem includes:    Ferrous Sulfate 325 (65 Fe) Mg Tbec (Ferrous sulfate) ..... One tab by mouth tid  Orders: [facility]- Est  Level 4 ([address])  Complete Medication List: 1)  Lisinopril-hydrochlorothiazide 20-12.5 Mg Tabs (Lisinopril-hydrochlorothiazide) .... Take one tablet daily for blood pressure 2)  Pravachol 80 Mg Tabs (Pravastatin sodium) .... One tablet daily at bedtime for cholesterol control 3)  Celexa 10 Mg Tabs (Citalopram hydrobromide) .... One  tab by mouth qhs for a week, then 2 tabs by mouth qhs 4)  Ferrous Sulfate 325 (65 Fe) Mg Tbec (Ferrous sulfate) .... One tab by mouth tid 5)  Colace 100 Mg Caps (Docusate sodium) .... One by mouth two times a day as needed for constipation. 6)  Hydroxyzine Hcl 25 Mg Tabs (Hydroxyzine hcl) .... One tab by mouth qhs  Patient  Instructions: 1)  Great to see you, 2)  Iron supplement, 3)  Think about colonoscopy, 4)  Come back in 4 weeks to discuss depression and colonoscopy again. 5)  Take the iron supplement, can also get stool softener as iron sometimes causes constipation. 6)  -Dr. Karie Schwalbe. Prescriptions: CELEXA 10 MG TABS (CITALOPRAM HYDROBROMIDE) One tab by mouth qHS for a week, then 2 tabs by mouth qHS  #30 x 0   Entered and Authorized by:   Rodney Langton MD   Signed by:   Rodney Langton MD on 01/25/2010   Method used:   Faxed to ...       [facility] (retail)       [address] [place], [place]  [address]       Ph: [phone]       Fax: [phone]   RxID:   [id number] PRAVACHOL 80 MG  TABS (PRAVASTATIN SODIUM) One tablet daily at bedtime for cholesterol control  #90 x 3   Entered and Authorized by:   Rodney Langton MD   Signed by:   Rodney Langton MD on 01/25/2010   Method used:   Faxed to ...       [facility] (retail)       [address] [place], [place]  [address]       Ph: [phone]       Fax: [phone]   RxID:   [id number] LISINOPRIL-HYDROCHLOROTHIAZIDE 20-12.5 MG  TABS (LISINOPRIL-HYDROCHLOROTHIAZIDE) Take one tablet daily for blood pressure  #90 x 3   Entered and Authorized by:   Rodney Langton MD   Signed by:   Rodney Langton MD on 01/25/2010   Method used:   Faxed to ...       [facility] Department (retail)       [address] [place], [place]  [address]       Ph: [phone]       Fax: [phone]   RxID:   [id number] HYDROXYZINE HCL 25 MG TABS (HYDROXYZINE HCL) One tab by mouth qHS  #90 x 6   Entered and Authorized by:   Rodney Langton MD   Signed by:   Rodney Langton MD on 01/25/2010   Method used:   Faxed to ...       [facility] (retail)       [address] [place], [place]  [address]       Ph: [phone]        Fax: [phone]   RxID:   [id number] COLACE 100 MG CAPS (DOCUSATE SODIUM) One by mouth two times a day as needed for constipation.  #30 x 6   Entered and Authorized by:   Rodney Langton MD   Signed by:   Rodney Langton MD on 01/25/2010   Method used:   Faxed to ...       [facility] (retail)  [address] [place], [place]  [address]       Ph: [phone]       Fax: [phone]   RxID:   [id number] FERROUS SULFATE 325 (65 FE) MG TBEC (FERROUS SULFATE) One tab by mouth TID  #90 x 6   Entered and Authorized by:   Rodney Langton MD   Signed by:   Rodney Langton MD on 01/25/2010   Method used:   Faxed to ...       [facility] Department (retail)       [address] [place], [place]  [address]       Ph: [phone]       Fax: [phone]   RxID:   [id number]   Appended Document: hemoccult cards  = negative    Lab Visit  Laboratory Results  Date/Time Received: January 25, 2010 Date/Time Reported: January 25, 2010 3:34 PM   Stool - Occult Blood Hemmoccult #1: negative Hemoccult #2: negative Hemoccult #3: negative Comments: no dates given ...............test performed by......Marland KitchenBonnie A. [place], MLS (ASCP)cm   Orders Today: Miscellaneous Lab Charge-[facility] [phone]

## 2010-07-11 NOTE — Assessment & Plan Note (Signed)
 Summary: fu wp   Vital Signs:  Patient Profile:   55 Years Old Female Weight:      228 pounds Pulse rate:   88 / minute BP sitting:   93 / 57  Vitals Entered By: GIOVANNA ADA CMA (January 20, 2008 1:42 PM)                 Serial Vital Signs/Assessments:  Time      Position  BP       Pulse  Resp  Temp     By 2:23 PM             104/70                         GIOVANNA ADA CMA   PCP:  DEBBY PETTIES MD  Chief Complaint:  fu.  History of Present Illness: The patient comes in for a follow up of her previous visit.  Briefly, she came in with symptoms suggestive of dyspepsia: epigastric pain, malaise, postural lightheadedness, and nausea.  I drew labs for CBC, CMP, Lipase, Amylase to rule out various intra-abdomina pathologies.  I also sent her with a script for an H2 blocker, and instructions to hydrate herself well.  She returns today very happy and in great spirits, elated that her symptoms resolved with the new medication.  She has not complaints, and feels completely better.  Denies HA, sore throat, CP, SOB, N/V/D/C, abd pain, wt loss, anorexia, postural dizziness.  BP low, but SBP >100 at recheck.  Acute Visit History: The patient has a prior history of peptic ulcer disease.           Diabetes Management History:      The patient is a 55 years old female who comes in with a history of impaired glucose tolerence.  She is (or has been) enrolled in the Diabetic Education Program.  She is not checking home blood sugars.  She says that she is not exercising regularly.        Hypoglycemic symptoms are not occurring.  No hyperglycemic symptoms are reported.    Dyspepsia History:      She has no alarm features of dyspepsia including no history of melena, hematochezia, dysphagia, persistent vomiting, or involuntary weight loss > 5%.  There is a prior history of GERD.  The patient has a prior history of documented ulcer disease.  The dominant symptom is heartburn or acid  reflux.  An H-2 blocker medication is currently being taken.  She notes that the symptoms have improved with the H-2 blocker therapy.  She has a history of a positive H. Pylori serology.    Lipid Management History:      Positive NCEP/ATP III risk factors include diabetes, family history for ischemic heart disease (females less than 43 years old & males less than 52 years old), and hypertension.  Negative NCEP/ATP III risk factors include female age less than 92 years old, non-tobacco-user status, no ASHD (atherosclerotic heart disease), no prior stroke/TIA, and no peripheral vascular disease.          Current Allergies: CODEINE  PHOSPHATE (CODEINE  PHOSPHATE)     Review of Systems       As above in HPI   Physical Exam  General:     Well-developed,well-nourished,in no acute distress; alert,appropriate and cooperative throughout examination Lungs:     Normal respiratory effort, chest expands symmetrically. Lungs are clear to auscultation, no crackles or wheezes. Heart:  Normal rate and regular rhythm. S1 and S2 normal without gallop, murmur, click, rub or other extra sounds. Abdomen:     Bowel sounds positive,abdomen soft and non-tender without masses, organomegaly or hernias noted.    Impression & Recommendations:  Problem # 1:  Hx of POSTURAL LIGHTHEADEDNESS (ICD-780.4) This was likely due to dehydration secondary to anorexia from the patients presenting complaint the the last visit.  This has completely resolved, as has the patient's abdominal pain.  BMP, LFT, Lipase, amylase were all normal, helping to rule out any other significant abdominal pathology.  As the patient's hemoglobin was within trend from previous ones, her MCV was in the low-normal range.  I will check ferritin levels at the next visit.  Orders: [facility]- Est Level  3 (00786)  Future Orders: Ferritin-[facility] ([id number]) ... 02/10/2008   Problem # 2:  HYPERLIPIDEMIA (ICD-272.4) I will see her again in a  month and will check her lipid panel at that time.   Her updated medication list for this problem includes:    Pravachol  80 Mg Tabs (Pravastatin  sodium) ..... One tablet daily at bedtime for cholesterol control  Future Orders: Lipid-[facility] ([id number]) ... 02/10/2008   Problem # 3:  DIABETES MELLITUS II, UNCOMPLICATED (ICD-250.00) THe patient is due for a HbA1c level, I will have this checked at the next visit, I will also schedule her for a dilated funduscopic exam.  Her updated medication list for this problem includes:    Lisinopril -hydrochlorothiazide  20-12.5 Mg Tabs (Lisinopril -hydrochlorothiazide ) .[name]. Take one tablet daily for blood pressure  Future Orders: A1C-[facility] ([id number]) ... 02/10/2008   Problem # 4:  SCREENING MAMMOGRAM NEC (ICD-V76.12) The patient is due for a mammogram, I will schedule this for when she comes in for her next labs. Future Orders: Mammogram (Screening) (Mammo) ... 02/10/2008   Complete Medication List: 1)  Lisinopril -hydrochlorothiazide  20-12.5 Mg Tabs (Lisinopril -hydrochlorothiazide ) .... Take one tablet daily for blood pressure 2)  Pravachol  80 Mg Tabs (Pravastatin  sodium) .... One tablet daily at bedtime for cholesterol control 3)  Ranitidine  Hcl 150 Mg Caps (Ranitidine  hcl) .... Take one cap at bedtime for 6 weeks.  Diabetes Management Assessment/Plan:      The following lipid goals have been established for the patient: Total cholesterol goal of 200; LDL cholesterol goal of 100; HDL cholesterol goal of 40; Triglyceride goal of 150.  Her blood pressure goal is < 130/80.    Dyspepsia Assessment/Plan:  Step Therapy: GERD Treatment Protocols:    Step-1: improved  Lipid Assessment/Plan:      Based on NCEP/ATP III, the patient's risk factor category is history of diabetes.  From this information, the patient's calculated lipid goals are as follows: Total cholesterol goal is 200; LDL cholesterol goal is 100; HDL cholesterol goal is 40; Triglyceride  goal is 150.     Patient Instructions: 1)  It was good to see you today, I'm glad you're feeling better. 2)  I want you to come back in one month to have some labs drawn, specifically, anemia studies, cholesterol, and Hb A1c.  You are also due for a mammogram.  I will schedule this for you. 3)  Make sure you keep yourself hydrated, its hot outside. 4)  -Dr. ONEIDA.   Prescriptions: PRAVACHOL  80 MG  TABS (PRAVASTATIN  SODIUM) One tablet daily at bedtime for cholesterol control  #34 x 3   Entered and Authorized by:   DEBBY PETTIES MD   Signed by:   DEBBY PETTIES MD on 01/20/2008   Method used:  Faxed to ...       [facility]       [address]       [place], [place]  [address]       Ph: [phone]       Fax: [phone]   RxID:   [id number] LISINOPRIL -HYDROCHLOROTHIAZIDE  20-12.5 MG  TABS (LISINOPRIL -HYDROCHLOROTHIAZIDE ) Take one tablet daily for blood pressure  #34 x 3   Entered and Authorized by:   DEBBY PETTIES MD   Signed by:   DEBBY PETTIES MD on 01/20/2008   Method used:   Faxed to ...       [facility]       [address]       [place], [place]  [address]       Ph: [phone]       Fax: [phone]   RxID:   [id number]  ] Flex Sig Next Due:  Not Indicated Last HGBA1C:  6.6 (08/25/2007 9:51:08 AM) HGBA1C Next Due:  6 mo

## 2010-07-11 NOTE — Progress Notes (Signed)
Summary: Med issue  Phone Note Outgoing Call   Call placed by: Jone Baseman, CMA Call placed to: Patient Summary of Call: I spoke with Brandi Davenport yesterday about her fenofibrate.  She uses the health department but was advised that fenofibrate was not avalible.  She picked up niacin from another place that had the med for $4, the fenofibrate on the otherhand was $83 and pt is unable to afford this.  Contacted Brandi Davenport @ the [employer].  Pt is not a MAP program pt, she will need to go see if she qualifies then she they will order the med.  This will take 6-8 weeks but the Dr. Karie Schwalbe is ok with this.  He does not want to start her on a statin as she has had trouble with this before.  Pt informed of the above and will call HD on monday. Initial call taken by: Jone Baseman CMA,  April 21, 2010 10:56 AM  Follow-up for Phone Call        Note, thanks.  Pt to fu with me after getting medications.  Myalgias on statins, better with stopping.  Will use niacin and fenofibrate but takes 6wks to get from [facility]. Follow-up by: Brandi Langton MD,  April 21, 2010 12:40 PM

## 2010-07-11 NOTE — Progress Notes (Signed)
 Summary: Ranitidine  refill  Phone Note Outgoing Call   Call placed by: AMY MARTIN RN,  April 08, 2008 10:28 AM Call placed to: Patient Summary of Call: I called pt because we received a refill request for ranitidine  300 mg, and I saw that she was recently put on famotidine.  Pt states she cannot afford the famotidine even by itself without the sucralafate.  Would like a refill on the ranitidine  300 mg - pt states she takes this twice daily.  Pt uses [facility] on [address].  Pt states the ranitidine  is really helping - her symptoms are much better- she is avoiding fried foods. Initial call taken by: AMY MARTIN RN,  April 08, 2008 10:35 AM  Follow-up for Phone Call        I will take off the Famotidine and sucralfate and refill her ranitidine .  She had told me at the last visit that ranitidine  wasn't working, but if she feels like its worse now without it then more power to her, lets get it back!  Please call pt and let her know she can now get ranitidine  300mg .  -Dr. ONEIDA. Follow-up by: DEBBY PETTIES MD,  April 08, 2008 11:41 AM    New/Updated Medications: RANITIDINE  HCL 300 MG CAPS (RANITIDINE  HCL) one tab by mouth at bedtime.   Prescriptions: RANITIDINE  HCL 300 MG CAPS (RANITIDINE  HCL) one tab by mouth at bedtime.  #30 x 6   Entered and Authorized by:   DEBBY PETTIES MD   Signed by:   DEBBY PETTIES MD on 04/08/2008   Method used:   Electronically to        [facility] (retail)       [address]       [place], [place]  [address]       Ph: [phone]       Fax: [phone]   RxID:   [id number]

## 2010-07-11 NOTE — Assessment & Plan Note (Signed)
 Summary: FU/KH   Vital Signs:  Patient profile:   55 year old female Height:      67 inches Weight:      233.06 pounds Temp:     97.3 degrees F oral Pulse rate:   76 / minute BP sitting:   141 / 82  (left arm)  Vitals Entered By: Arland Morel (February 22, 2009 9:01 AM) CC: DM check, hospital f/u for chest pain, hot flashes Is Patient Diabetic? Yes  Pain Assessment Patient in pain? yes     Location: chest Intensity: 9 Type: sharp Onset of pain  August 2010   Primary Care Provider:  DEBBY PETTIES MD  CC:  DM check, hospital f/u for chest pain, and hot flashes.  History of Present Illness: 44F here for HFU and MSK CP.  HFU:  Admitted for r/o MI, very atypical symptoms but admitted 2/2 HTN, HLD, DM2.  Negative for MI, no further wu needed.    MSK CP:  Present for several months now.  CP is located right costal margin, is very TTP, worse with deep breathing.  Sometimes feels it in back as well.  Sharp, does not radiate around torso.  Toradol  shot given at last visit resolved pain.  Vicodin and flexeril  given at last visit did not help.  Naproxen  Rx-ed in hospital did not help.  Habits & Providers  Alcohol-Tobacco-Diet     Tobacco Status: quit > 6 months  Allergies: 1)  Codeine  Phosphate (Codeine  Phosphate)  Past History:  Past Medical History: Last updated: 01/08/2008 2/03 - nl Tsh & free T4, 3/03 - thyroid U/S - mild diffuse enlargement of, 7/04- Cr 0.7, both lobes without discrete solid or cystic masses, Enlarged thyroid, hyperopic astigmatism and presbyopia, retrocalcaneal bursitis  H. Pylori positive  Past Surgical History: Last updated: 08/08/2006 stress test 1/04- 70% EF, poor ex. Tolerance - 07/30/2002, total  hysterectomy - 89 - 12/18/2000  Family History: Last updated: 23-Feb-2009 father died of MI at 87;, mother and brother with DM-2; Mother- HTN  Social History: Last updated: February 23, 2009 lives with husband, and her son; works in housekeeping at  [employer]; poor diet; no etoh/drugs/cigs;; changed name (2003) [name]-> Loreli; Job causing headaches, anxiet, dizziness.; Leaving her husband 9-05? on Feb 23, 2009 still with huband.  Son now out of [place].  Social History: Smoking Status:  quit > 6 months  Review of Systems       See HPI  Physical Exam  General:  Well-developed,well-nourished,in no acute distress; alert,appropriate and cooperative throughout examination Chest Wall:  Palpable, tender nodule, approx 2cm, located over approx 4th -6th costal cartilage on right.  No induration or erythema. Lungs:  Normal respiratory effort, chest expands symmetrically. Lungs are clear to auscultation, no crackles or wheezes. Heart:  Normal rate and regular rhythm. S1 and S2 normal without gallop, murmur, click, rub or other extra sounds. Abdomen:  Bowel sounds positive,abdomen soft and non-tender without masses, organomegaly or hernias noted.   Impression & Recommendations:  Problem # 1:  COSTOCHONDRITIS (ICD-733.6) Assessment Unchanged Symptoms and signs consistent with costochondritis.  Avoiding oral NSAIDS.  Pt cannot afford voltaren  or lidoderm.  Will have [facility] compound topical ketoprofen.   Will also check vit D levels as this could represent the richitic rosary of vitamin D deficiency.   Also shot of toradol  given. Orders: Vit D, 25 [facility] [phone]) [facility]- Est  Level 4 (00785) Ketorolac -Toradol  15mg  (G8114)  Problem # 2:  GERD (ICD-530.81) Assessment: Improved  Her updated  medication list for this problem includes:    Famotidine 20 Mg Tabs (Famotidine) .[name]. 2 tabs by mouth qhs  Orders: [facility]- Est  Level 4 (00785)  Problem # 3:  HYPERLIPIDEMIA (ICD-272.4) Assessment: Improved Lipid panel from hospital acceptable.  Her updated medication list for this problem includes:    Pravachol  80 Mg Tabs (Pravastatin  sodium) ..... One tablet daily at bedtime for cholesterol control  Orders: [facility]- Est   Level 4 (00785)  Problem # 4:  HYPERTENSION, BENIGN SYSTEMIC (ICD-401.1) Assessment: Unchanged Elevated systolic at this visit but has been controlled in the past, no changes at this time.  Her updated medication list for this problem includes:    Lisinopril -hydrochlorothiazide  20-12.5 Mg Tabs (Lisinopril -hydrochlorothiazide ) .[name]. Take one tablet daily for blood pressure  Orders: [facility]- Est  Level 4 (99214)  Problem # 5:  DIABETES MELLITUS II, UNCOMPLICATED (ICD-250.00) Assessment: Improved A1c ok, no changes.  Her updated medication list for this problem includes:    Lisinopril -hydrochlorothiazide  20-12.5 Mg Tabs (Lisinopril -hydrochlorothiazide ) .[name]. Take one tablet daily for blood pressure  Orders: A1C-[facility] ([id number]) [facility]- Est  Level 4 (00785)  Complete Medication List: 1)  Lisinopril -hydrochlorothiazide  20-12.5 Mg Tabs (Lisinopril -hydrochlorothiazide ) .... Take one tablet daily for blood pressure 2)  Pravachol  80 Mg Tabs (Pravastatin  sodium) .... One tablet daily at bedtime for cholesterol control 3)  Famotidine 20 Mg Tabs (Famotidine) .... 2 tabs by mouth qhs 4)  Cyclobenzaprine  Hcl 5 Mg Tabs (Cyclobenzaprine  hcl) .... One tab by mouth tid 5)  Vicodin 5-500 Mg Tabs (Hydrocodone-acetaminophen ) .... One tab by mouth q4h as needed pain 6)  Ketoprofen  .... Topical, applied q6h, to be compounded at [facility]  Patient Instructions: 1)  Great to see you today, 2)  I want to try a topical medication, ketoprofen. You will need to go to [facility] to have them compound it. 3)  I also want to check a Vitamin D level as this can occasionally cause your type of chest pain. 4)  Come back to see me in 6 months or sooner if the pain is not better in a couple of weeks. 5)  -Dr. ONEIDA.  Laboratory Results   Blood Tests   Date/Time Received: February 22, 2009 9:04 AM  Date/Time Reported: February 22, 2009 9:19 AM   HGBA1C: 6.9%   (Normal Range: Non-Diabetic - 3-6%    Control Diabetic - 6-8%)  Comments: ...............test performed by......SABRABonnie A. Jordan, [place] (ASCP)        Medication Administration  Injection # 1:    Medication: Ketorolac -Toradol  15mg     Diagnosis: COSTOCHONDRITIS (ICD-733.6)    Route: IM    Site: LUOQ gluteus    Exp Date: 11/10/2010    Lot #: [id number]    Mfr: Hospira    Comments: Toradol  30mg  given IM x 1.     Patient tolerated injection without complications    Given by: Arland Morel (February 22, 2009 9:55 AM)  Orders Added: 1)  A1C-[facility] [83036] 2)  Vit D, 25 [facility] [[id number]] 3)  [facility]- Est  Level 4 [00785] 4)  Ketorolac -Toradol  15mg  [[id number]]  Appended Document: FU/KH    Clinical Lists Changes  Observations: Added new observation of PERSLDLGOAL: 100  (02/22/2009 12:41) Added new observation of PERSBPGOAL: 130/80  (02/22/2009 12:41) Added new observation of PERSA1CGOAL: 8  (02/22/2009 12:41) Added new observation of HTN PROGRESS: Deteriorated  (02/22/2009 12:41) Added new observation of HTN FSREVIEW: Yes  (02/22/2009 12:41) Added new observation of LIPID FSREVW: Yes  (02/22/2009 12:41) Added new observation of  LIPID PROGRS: At goal  (02/22/2009 12:41) Added new observation of DM FSREVIEW: Yes  (02/22/2009 12:41) Added new observation of DM PROGRESS: At goal  (02/22/2009 12:41)       Prevention & Chronic Care Immunizations   Influenza vaccine: given  (03/05/2008)   Influenza vaccine due: 03/05/2009    Tetanus booster: 07/12/2001: Done.   Tetanus booster due: 07/13/2011    Pneumococcal vaccine: Not documented  Colorectal Screening   Hemoccult: Not documented    Colonoscopy: Not documented  Other Screening   Pap smear: Hx of Hysterectomy  (03/19/2008)   Pap smear due: Not Indicated    Mammogram: ASSESSMENT: Negative - BI-RADS 1^MS DIGITAL SCREENING  (04/21/2008)   Mammogram due: 04/21/2009   Smoking status: quit > 6 months  (02/22/2009)  Diabetes Mellitus   HgbA1C: 6.9   (02/22/2009)   Hemoglobin A1C due: 05/01/2008    Eye exam: normal  (06/25/2006)   Eye exam due: 06/26/2007    Foot exam: yes  (08/26/2008)   High risk foot: Not documented   Foot care education: completed  (08/26/2008)   Foot exam due: 08/26/2009    Urine microalbumin/creatinine ratio: Not documented   Urine microalbumin/cr due: 12/06/2009    Diabetes flowsheet reviewed?: Yes   Progress toward A1C goal: At goal  Lipids   Total Cholesterol: 137  (01/30/2008)   LDL: 70  (01/30/2008)   LDL Direct: 80  (05/22/2007)   HDL: 55  (01/30/2008)   Triglycerides: 61  (01/30/2008)    SGOT (AST): 12  (01/08/2008)   SGPT (ALT): 11  (01/08/2008)   Alkaline phosphatase: 49  (01/08/2008)   Total bilirubin: 0.5  (01/08/2008)    Lipid flowsheet reviewed?: Yes   Progress toward LDL goal: At goal  Hypertension   Last Blood Pressure: 141 / 82  (02/22/2009)   Serum creatinine: 0.61  (01/08/2008)   Serum potassium 3.9  (01/08/2008)    Hypertension flowsheet reviewed?: Yes   Progress toward BP goal: Deteriorated

## 2010-07-11 NOTE — Miscellaneous (Signed)
 Summary: P4HM no opthamology appt  Clinical Lists Changes rec'd letter back that pt dnka to get set up with [facility]. thus she is not going to be set up with the specialist. letter to md chart box.Raejean Mau RN  September 13, 2008 11:13 AM

## 2010-07-11 NOTE — Assessment & Plan Note (Signed)
 Summary: h fu per dr t wp   Vital Signs:  Patient Profile:   55 Years Old Female Height:     67 inches Weight:      228.31 pounds BMI:     35.89 Temp:     98.4 degrees F Pulse rate:   79 / minute BP sitting:   115 / 73  (right arm)  Pt. in pain?   yes    Location:   chest    Intensity:   4  Vitals Entered By: NATHANEL SABA RN (March 19, 2008 2:02 PM)              Is Patient Diabetic? No    Last Flu Vaccine:  Fluvax 3+ (03/13/2007 5:14:14 PM) Flu Vaccine Result Date:  03/05/2008 Flu Vaccine Result:  given Flu Vaccine Next Due:  1 yr Last PAP:  normal (07/12/2005 4:46:53 PM) PAP Result Date:  03/19/2008 PAP Result:  Hx of Hysterectomy PAP Next Due:  Not Indicated   PCP:  DEBBY PETTIES MD   History of Present Illness: 55 year old female with HTN, HLD, GERD returns for hospital f/u for chest pain.  See discharge summary for full details, still having chest pain, 4/10, occurs when eating or changing to horizontal position, burning, substernal, associated with greasy foods.  Somem dificulty swallowing, foods get stuck in upper esophagus.  States has had an barium swallow 3 years ago and it was negative.  Ranitidine  300 helping slightly, cannot afford PPIs, cannot afford H. Pylori treatment.  No N/V/D/C/melena.  No wt loss.    Diabetes Management History:      The patient is a 55 years old female who comes in with a history of impaired glucose tolerence.  She is (or has been) enrolled in the Diabetic Education Program.  She is not checking home blood sugars.  She says that she is not exercising regularly.    Dyspepsia History:      She has no alarm features of dyspepsia including no history of melena, hematochezia, dysphagia, persistent vomiting, or involuntary weight loss > 5%.  There is a prior history of GERD.  She notes that there have been breakthrough symptoms despite maximum H-2 blocker or PPI therapy and that she has never had an upper endoscopy or GI  consultation.  The patient has a prior history of documented ulcer disease.  An H-2 blocker medication is currently being taken.  She notes that the symptoms have improved with the H-2 blocker therapy.  Symptoms have persisted after 4 weeks of H-2 blocker treatment.  She has a history of a positive H. Pylori serology.  No previous upper endoscopy has been done.         Current Allergies: CODEINE  PHOSPHATE (CODEINE  PHOSPHATE)  Past Medical History:    Reviewed history from 01/08/2008 and no changes required:       2/03 - nl Tsh & free T4, 3/03 - thyroid U/S - mild diffuse enlargement of, 7/04- Cr 0.7, both lobes without discrete solid or cystic masses, Enlarged thyroid, hyperopic astigmatism and presbyopia, retrocalcaneal bursitis              H. Pylori positive  Past Surgical History:    Reviewed history from 08/08/2006 and no changes required:       stress test 1/04- 70% EF, poor ex. Tolerance - 07/30/2002, total  hysterectomy - 95 - 12/18/2000   Family History:    Reviewed history from 08/08/2006 and no changes required:  father died of MI at 64;, mother and brother with DM-2;  Social History:    Reviewed history from 08/08/2006 and no changes required:       lives with husband, and her son; works in housekeeping at [employer]; poor diet; no etoh/drugs/cigs;; changed name (2003) [name]-> Loreli; Job causing headaches, anxiet, dizziness.; Leaving her husband 9-05?  Son in [place].   Risk Factors:  PAP Smear History:     Date of Last PAP Smear:  03/19/2008  PAP Smear History:     Date of Last PAP Smear:  03/19/2008    Results:  Hx of Hysterectomy  Mammogram History:     Date of Last Mammogram:  07/18/2005   PAP Smear History:     Date of Last PAP Smear:  07/12/2005    Review of Systems       As above in HPI   Physical Exam  General:     Well-developed,well-nourished,in no acute distress; alert,appropriate and cooperative throughout examination Head:      Normocephalic and atraumatic without obvious abnormalities. Eyes:     No corneal or conjunctival inflammation noted. EOMI. Perrla.  Ears:     External ear exam shows no significant lesions or deformities. Nose:     External nasal examination shows no deformity or inflammation Neck:     supple.   Lungs:     Normal respiratory effort, chest expands symmetrically. Lungs are clear to auscultation, no crackles or wheezes. Heart:     Normal rate and regular rhythm. S1 and S2 normal without gallop, murmur, click, rub or other extra sounds. Abdomen:     Bowel sounds positive,abdomen soft and non-tender without masses, organomegaly or hernias noted.    Impression & Recommendations:  Problem # 1:  GERD (ICD-530.81) Pt still having pain, no longer in epigastrium but more burning substernal.  Recently had negative stress test in hospital, so this is not cardiac.  Says the ranitidine  helps only a little.  Cannot afford PPIs, cannot afford H. Pylori treatment.  With a history of difficulty swallowing and food getting caught in upper esophagus would like to rule out stricture or Zenker diverticulum so would like to do EGD but pt unable to afford the expensive procedure or gastroenterologist visit.  Makes too much money to be elligible for [facility] help, but too little to afford meds.  I would like to treat her for H. Pylori but she cannot afford triple therapy.  May consider double therapy (PPI + amoxicillin ) which is FDA approved but has shown to be less efficacious than triple therapy.  For now I will change her from ranitidine  to famotidine max dose to see if that helps her some.  I will also try sucralfate, its not that cheap but less expensive than PPIs and EGDs.   Her updated medication list for this problem includes:    Famotidine 40 Mg Tabs (Famotidine) ..... One tab by mouth at bedtime    Sucralfate 1 Gm Tabs (Sucralfate) ..... One tab by mouth daily  Orders: [facility]- Est  Level 4  (00785)   Problem # 2:  HYPERLIPIDEMIA (ICD-272.4) Well controlled.  Her updated medication list for this problem includes:    Pravachol  80 Mg Tabs (Pravastatin  sodium) ..... One tablet daily at bedtime for cholesterol control   Problem # 3:  HYPERTENSION, BENIGN SYSTEMIC (ICD-401.1) Well controlled.  Her updated medication list for this problem includes:    Lisinopril -hydrochlorothiazide  20-12.5 Mg Tabs (Lisinopril -hydrochlorothiazide ) .[name]. Take one tablet daily for blood  pressure   Problem # 4:  Preventive Health Care (ICD-V70.0) Pt has not yet done hemoccult cards from last visit, states she will do them and send in.  Complete Medication List: 1)  Lisinopril -hydrochlorothiazide  20-12.5 Mg Tabs (Lisinopril -hydrochlorothiazide ) .... Take one tablet daily for blood pressure 2)  Pravachol  80 Mg Tabs (Pravastatin  sodium) .... One tablet daily at bedtime for cholesterol control 3)  Famotidine 40 Mg Tabs (Famotidine) .... One tab by mouth at bedtime 4)  Sucralfate 1 Gm Tabs (Sucralfate) .... One tab by mouth daily  Diabetes Management Assessment/Plan:      The following lipid goals have been established for the patient: Total cholesterol goal of 200; LDL cholesterol goal of 100; HDL cholesterol goal of 40; Triglyceride goal of 150.  Her blood pressure goal is < 130/80.    Dyspepsia Assessment/Plan:  Step Therapy: GERD Treatment Protocols:    Step-1: improved   Patient Instructions: 1)  Good to see you again, I will switch you to famotidine and see if you have any relief with that medication, it is the same class as ranitidine .  I will also send a script for sucralfate, however if this proves too expensive then you don't have to buy it.  You blood pressure and cholesterol is great.  I do need you to send in the hemoccult cards given to you at your September appointment with me, if you don't have them then please ask my nurse for more.   2)  Come back to see me in 3 months. 3)  -Dr.  ONEIDA.   Prescriptions: SUCRALFATE 1 GM TABS (SUCRALFATE) One tab by mouth daily  #90 x 6   Entered and Authorized by:   DEBBY PETTIES MD   Signed by:   DEBBY PETTIES MD on 03/19/2008   Method used:   Electronically to        [facility] (retail)       [address]       [place], [place]  [address]       Ph: [phone]       Fax: [phone]   RxID:   [id number] FAMOTIDINE 40 MG TABS (FAMOTIDINE) one tab by mouth at bedtime  #30 x 6   Entered and Authorized by:   DEBBY PETTIES MD   Signed by:   DEBBY PETTIES MD on 03/19/2008   Method used:   Electronically to        [facility] (retail)       [address]       [place], [place]  [address]       Ph: [phone]       Fax: [phone]   RxID:   [id number]  ]  Vital Signs:  Patient Profile:   55 Years Old Female Height:     67 inches Weight:      228.31 pounds BMI:     35.89 Temp:     98.4 degrees F Pulse rate:   79 / minute BP sitting:   115 / 73    Location:   chest    Intensity:   4                 Last Flu Vaccine:  Fluvax 3+ (03/13/2007 5:14:14 PM) Flu Vaccine Result Date:  03/05/2008 Flu Vaccine Result:  given Flu Vaccine Next Due:  1 yr

## 2010-07-11 NOTE — Assessment & Plan Note (Signed)
Summary: weak & dizzy,tcb   Vital Signs:  Patient profile:   55 year old female Weight:      238.9 pounds Temp:     98.9 degrees F oral Pulse rate:   73 / minute Pulse (ortho):   80 / minute Pulse rhythm:   regular BP sitting:   130 / 86  (left arm) BP standing:   138 / 89 Cuff size:   large  Vitals Entered By: Loralee Pacas CMA (January 10, 2010 8:39 AM)  Serial Vital Signs/Assessments:  Time      Position  BP       Pulse  Resp  Temp     By 8:53 AM   Lying RA  103/73   79                    Tonya Barkley CMA 8:53 AM   Lying LA  135/84   84                    Tonya Barkley CMA 8:53 AM   Sitting   121/84   74                    Tonya Barkley CMA 8:53 AM   Standing  138/89   80                    Tonya Barkley CMA  CC: weak and dizzy Is Patient Diabetic? Yes Did you bring your meter with you today? No Comments pt has been feeling weak and dizzy for appx 6mos   Primary Care Provider:  Rodney Langton MD  CC:  weak and dizzy.  History of Present Illness: 55 yo female with c/o weakness when standing, occasional SOB, dizziness for 6 months now described as presyncopal rather than vertiginous, progressive.  She also does endorse 6 months of stools that are occasionally dark and tarry.  No weight loss.  No fevers/chills, night sweats.  No N/V/D/C/abd pain.    She also endorses feeling "depressed" since February (6 months ago) since losing her job.  She reports changes in sleep, interest, energy, concentration, appetite.  No SI/HI.  Wants to try a medication for her symptoms.  Current Medications (verified): 1)  Lisinopril-Hydrochlorothiazide 20-12.5 Mg  Tabs (Lisinopril-Hydrochlorothiazide) .... Take One Tablet Daily For Blood Pressure 2)  Pravachol 80 Mg  Tabs (Pravastatin Sodium) .... One Tablet Daily At Bedtime For Cholesterol Control 3)  Celexa 10 Mg Tabs (Citalopram Hydrobromide) .... One Tab By Mouth Qhs For A Week, Then 2 Tabs By Mouth Qhs  Allergies (verified): 1)   Codeine Phosphate (Codeine Phosphate)  Past History:  Past Medical History: Last updated: 01/08/2008 2/03 - nl Tsh & free T4, 3/03 - thyroid U/S - mild diffuse enlargement of, 7/04- Cr 0.7, both lobes without discrete solid or cystic masses, Enlarged thyroid, hyperopic astigmatism and presbyopia, retrocalcaneal bursitis  H. Pylori positive  Review of Systems       See HPI  Physical Exam  General:  Well-developed,well-nourished,in no acute distress; alert,appropriate and cooperative throughout examination Head:  Normocephalic and atraumatic without obvious abnormalities. Eyes:  No corneal or conjunctival inflammation noted. EOMI. Perrla.  Ears:  External ear exam shows no significant lesions or deformities. discharge. Hearing is grossly normal bilaterally. Nose:  External nasal examination shows no deformity or inflammation Mouth:  Oral mucosa and oropharynx without lesions or exudates.   Lungs:  Normal respiratory effort, chest expands symmetrically.  Lungs are clear to auscultation, no crackles or wheezes. Heart:  Normal rate and regular rhythm. S1 and S2 normal without gallop, murmur, click, rub or other extra sounds. Abdomen:  Bowel sounds positive,abdomen soft and non-tender without masses, organomegaly or hernias noted. Psych:  Oriented X3, memory intact for recent and remote, normally interactive, good eye contact, not anxious appearing, not suicidal, not homicidal, depressed affect, and flat affect.     Impression & Recommendations:  Problem # 1:  DEPRESSION, MAJOR (ICD-296.20) Assessment New Criteria met for MDD.  Trial of celexa.  Will see her q2wk until stable then q monthly.  PHQ-9 at each visit.  Orders: [facility]- Est  Level 4 (16109)  Problem # 2:  ANEMIA (ICD-285.9) Assessment: New Low hb on POC.  Will do anemia workup to determine type.  With hx melena will also do hemoccult cards to determine if truly from stool.  She is due for a colonoscopy as well so will set this  up at next visit.  Orders: Comp Met-[facility] [phone]) CBC-[facility] [phone]) B12-[facility] [phone]) Ferritin-[facility] [phone]) Folate-[facility] [phone]) INR/PT-[facility] (02725) Iron -[facility] [phone]) Iron Binding Cap (TIBC)-[facility] ([address]) Retic-[facility] ([id number]) Sickle Cell Scr-[facility] ([id number]) [facility]- Est  Level 4 (01093)  Problem # 3:  DIZZINESS (ICD-780.4) Assessment: New See #2.  Orders: Hemoglobin-[facility] (23557) Comp Met-[facility] [phone]) CBC-[facility] [phone]) B12-[facility] [phone]) Ferritin-[facility] [phone]) Folate-[facility] [phone]) INR/PT-[facility] (00938) Iron -[facility] [phone]) Iron Binding Cap (TIBC)-[facility] ([id number]) Retic-[facility] ([id number]) Sickle Cell Scr-[facility] ([id number]) [facility]- Est  Level 4 (44315)  Complete Medication List: 1)  Lisinopril-hydrochlorothiazide 20-12.5 Mg Tabs (Lisinopril-hydrochlorothiazide) .... Take one tablet daily for blood pressure 2)  Pravachol 80 Mg Tabs (Pravastatin sodium) .... One tablet daily at bedtime for cholesterol control 3)  Celexa 10 Mg Tabs (Citalopram hydrobromide) .... One tab by mouth qhs for a week, then 2 tabs by mouth qhs  Other Orders: A1C-[facility] (40086)  Patient Instructions: 1)  Checking some bloodwork. 2)  Starting a new medicine for depression. 3)  Come back to see me in 2 weeks.  Ok to double book appts. 4)  -Dr.T . Prescriptions: CELEXA 10 MG TABS (CITALOPRAM HYDROBROMIDE) One tab by mouth qHS for a week, then 2 tabs by mouth qHS  #30 x 0   Entered and Authorized by:   Rodney Langton MD   Signed by:   Rodney Langton MD on 01/10/2010   Method used:   Print then Give to Patient   RxID:   [id number]   Laboratory Results   Blood Tests   Date/Time Received: January 10, 2010 8:45 AM  Date/Time Reported: January 10, 2010 8:58 AM   HGBA1C: 7.2%   (Normal Range: Non-Diabetic - 3-6%   Control Diabetic - 6-8%)   CBC   HGB:  10.8 g/dL   (Normal Range: 09.9-83.3 in Males, 12.0-15.0 in Females) Comments: capillary  sample ...............test performed by......Marland KitchenBonnie A. [place], MLS (ASCP)cm

## 2010-07-11 NOTE — Progress Notes (Signed)
 Summary: RX REQUEST  Phone Note Call from Patient   Caller: Patient Summary of Call: pT STATES SHE CANNOT AFFORD THE TWO RX CALLED IN ON 10/9. SHE WANTS TO KNOW IF THERE IS ANOTHER MED SHE CAN TAKE. Initial call taken by: CHRISSIE LERNER CMA,,  March 29, 2008 5:19 PM    Can you ask her if she can afford the famotidine alone?  She can forget the sucralfate, I told her she only needs to take it if she can afford it.  Have her call back if she can't afford the famotidine alone.  -Dr. ONEIDA.

## 2010-07-11 NOTE — Assessment & Plan Note (Signed)
Summary: Muscle soreness/kf   Vital Signs:  Patient profile:   55 year old female Weight:      237.6 pounds Temp:     99 degrees F oral Pulse rate:   112 / minute Pulse rhythm:   regular BP sitting:   136 / 81  (left arm) Cuff size:   large  Vitals Entered By: Loralee Pacas CMA (April 03, 2010 2:35 PM) CC: muscle soreness x 2 months Comments pt stated that it feels like her bones are locking up and make her muscles really sore.  pt states it makes her feel like she is paralyzed. when she goes from a lying to a sitting position she feels like she has pulled a muscle, she had this issue before and you gave her some cream to put on it and would like some more but is unsure if health dept will pay she had to pay out of pocket last time.   Primary Care Provider:  Rodney Langton MD  CC:  muscle soreness x 2 months.  History of Present Illness: 55 yo female here with hx muscle aches.  I dont know why she is only presenting now however she states that back in 02/01/10 she was having body aches as described above in 'comments' section.  She stopped her pravastatin at that point and all symptoms improved.  She is asymptomatic at this point.  Elevated A1c:  Most recently >7.    Chest discomfort:  Has been seen by cards, myoview neg in 2009, h pylori positive in the past, never been on PPI 2/2 cost.  Habits & Providers  Alcohol-Tobacco-Diet     Alcohol drinks/day: 0     Tobacco Status: quit > 6 months     Year Quit: 2002     Passive Smoke Exposure: no  Allergies: 1)  Codeine Phosphate (Codeine Phosphate)  Past History:  Past Medical History: Myoview neg 03/05/10 Anemia, post menopausal, refusing colonoscopy, risks discussed. 2/03 - nl Tsh & free T4, 3/03 - thyroid U/S - mild diffuse enlargement of, 7/04- Cr 0.7, both lobes without discrete solid or cystic masses, Enlarged thyroid, hyperopic astigmatism and presbyopia, retrocalcaneal bursitis H. Pylori POSITIVE  Review of  Systems       See HPI  Physical Exam  General:  Well-developed,well-nourished,in no acute distress; alert,appropriate and cooperative throughout examination Lungs:  Normal respiratory effort, chest expands symmetrically. Lungs are clear to auscultation, no crackles or wheezes. Heart:  Normal rate and regular rhythm. S1 and S2 normal without gallop, murmur, click, rub or other extra sounds. Abdomen:  Bowel sounds positive,abdomen soft and non-tender without masses, organomegaly or hernias noted. Extremities:  No edema.   Impression & Recommendations:  Problem # 1:  MYALGIA (ICD-729.1) Assessment New Likely 2/2 statin. Currently resolved, will check CK total. Pt should stay off statins for now. Can try Niacin and gemfibrozil for cholesterol control.  Her updated medication list for this problem includes:    Aspirin 81 Mg Tbec (Aspirin) ..... One tab by mouth daily  Orders: [facility]- Est  Level 4 (99214) CK (Creatine Kinase)-[facility] [id number])  Problem # 2:  GERD (ICD-530.81) Assessment: Unchanged With positive h pylori.   Convinced her to pay for omeprazole 20 two times a day. She will RTC to se how this is doing.  Her updated medication list for this problem includes:    Prilosec 20 Mg Cpdr (Omeprazole) ..... One tab by mouth two times a day  Orders: [facility]- Est  Level 4 (21308)  Problem # 3:  HYPERLIPIDEMIA (ICD-272.4) Assessment: Unchanged Off statin for 2 months now, checking dLDL. Fibrate/niacin if elevated.  Her updated medication list for this problem includes:    Pravachol 80 Mg Tabs (Pravastatin sodium) ..... On hold.  one tablet daily at bedtime for cholesterol control  Orders: [facility]- Est  Level 4 (99214) Comp Met-[facility] ([address]-60454) Direct LDL-[facility] (09811-91478)  Problem # 4:  DIABETES MELLITUS II, UNCOMPLICATED (ICD-250.00) Assessment: Deteriorated Last a1c high, starting metformin. Rechecking today. Pt to meet with Dr. Raymondo Band for additional education re:  DM2. Added ASA 81mg .  Her updated medication list for this problem includes:    Lisinopril-hydrochlorothiazide 20-12.5 Mg Tabs (Lisinopril-hydrochlorothiazide) .Marland Kitchen... Take one tablet daily for blood pressure    Metformin Hcl 500 Mg Tabs (Metformin hcl) ..... One tab by mouth two times a day    Aspirin 81 Mg Tbec (Aspirin) ..... One tab by mouth daily  Orders: [facility]- Est  Level 4 (99214) A1C-[facility] (29562)  Problem # 5:  HYPERTENSION, BENIGN SYSTEMIC (ICD-401.1) Assessment: Unchanged BP not ideal but ok today, if still elevated at next visit would increase lisinopril.HCTZ to 20/25.  Her updated medication list for this problem includes:    Lisinopril-hydrochlorothiazide 20-12.5 Mg Tabs (Lisinopril-hydrochlorothiazide) .Marland Kitchen... Take one tablet daily for blood pressure  Orders: [facility]- Est  Level 4 (99214)  Complete Medication List: 1)  Lisinopril-hydrochlorothiazide 20-12.5 Mg Tabs (Lisinopril-hydrochlorothiazide) .... Take one tablet daily for blood pressure 2)  Pravachol 80 Mg Tabs (Pravastatin sodium) .... On hold.  one tablet daily at bedtime for cholesterol control 3)  Celexa 10 Mg Tabs (Citalopram hydrobromide) .... One tab by mouth qhs for a week, then 2 tabs by mouth qhs 4)  Ferrous Sulfate 325 (65 Fe) Mg Tbec (Ferrous sulfate) .... One tab by mouth tid 5)  Colace 100 Mg Caps (Docusate sodium) .... One by mouth two times a day as needed for constipation. 6)  Hydroxyzine Hcl 25 Mg Tabs (Hydroxyzine hcl) .... One tab by mouth qhs 7)  Metformin Hcl 500 Mg Tabs (Metformin hcl) .... One tab by mouth two times a day 8)  Aspirin 81 Mg Tbec (Aspirin) .... One tab by mouth daily 9)  Prilosec 20 Mg Cpdr (Omeprazole) .... One tab by mouth two times a day  Patient Instructions: 1)  Great to see you, 2)  Start metformin, you now have diabetes. 3)  Stop your pravastatin. 4)  Checking bloodwork. 5)  Start omeprazole 20mg  two times a day for reflux. 6)  Come back to see me in 1 month. 7)  -Dr.  Karie Schwalbe. Prescriptions: PRILOSEC 20 MG CPDR (OMEPRAZOLE) One tab by mouth two times a day  #60 x 3   Entered and Authorized by:   Rodney Langton MD   Signed by:   Rodney Langton MD on 04/03/2010   Method used:   Faxed to ...       [facility] Department (retail)       [address] [place], [place]  [address]       Ph: [phone]       Fax: [phone]   RxID:   [id number] ASPIRIN 81 MG TBEC (ASPIRIN) One tab by mouth daily  #90 x 3   Entered and Authorized by:   Rodney Langton MD   Signed by:   Rodney Langton MD on 04/03/2010   Method used:   Faxed to ...       [facility] Department (retail)  [address] [place], [place]  [address]       Ph: [phone]       Fax: [phone]   RxID:   [id number] METFORMIN HCL 500 MG TABS (METFORMIN HCL) One tab by mouth two times a day  #60 x 3   Entered and Authorized by:   Rodney Langton MD   Signed by:   Rodney Langton MD on 04/03/2010   Method used:   Faxed to ...       [facility] Department (retail)       [address] [place], [place]  [address]       Ph: [phone]       Fax: [phone]   RxID:   [id number]    Orders Added: 1)  [facility]- Est  Level 4 [99214] 2)  Comp Met-[facility] [52778-24235] 3)  Direct LDL-[facility] [36144-31540] 4)  CK (Creatine Kinase)-[facility] [82550-23250] 5)  A1C-[facility] [83036]    Laboratory Results   Blood Tests   Date/Time Received: April 03, 2010 3:33 PM  Date/Time Reported: April 03, 2010 3:56 PM   HGBA1C: 7.1%   (Normal Range: Non-Diabetic - 3-6%   Control Diabetic - 6-8%)  Comments: ...........test performed by...........Marland KitchenTerese Door, CMA            CMP ordered   CMP ordered

## 2010-07-11 NOTE — Progress Notes (Signed)
 Summary: need meds  Phone Note Call from Patient Call back at [phone]   Caller: Patient Summary of Call: has been having headaches and cannot rest since her granson died last month,  would like something called in for her. [facility] Initial call taken by: KARNA SEMINOLE,  April 19, 2008 10:43 AM  Follow-up for Phone Call        We can try diphenhydramine 50mg  by mouth qHS.  I cannot do ambien  or anything like that though unless i've seen her.   Let me know if i need to send the diphenhydramine script in.  -Dr. ONEIDA. Follow-up by: DEBBY PETTIES MD,  April 19, 2008 12:26 PM      Appended Document: need meds RX FOR DIPHENHYDRAMINE 50 MG ONE TAB by mouth at bedtime  #30 O REFILLS SENT VIA EPRESCRIBE TO [facility] [address]    ............................DELORES PATE-GADDY,CMA (AAMA)

## 2010-07-11 NOTE — Progress Notes (Signed)
Summary: triage  Phone Note Call from Patient Call back at Home Phone [phone]   Caller: Patient Summary of Call: Pt put on new meds for diabetes and wondering if it causes diahrrea. Initial call taken by: Clydell Hakim,  April 07, 2010 3:50 PM  Follow-up for Phone Call        Patient just got the Metformin filled today and took her first dose at noon.  Within 3 hours she was having stomach pains and diarrhea.  Told her to stop taking the med and I would contact Dr. Karie Schwalbe for further advise.  Told her I would call her back. Follow-up by: Dennison Nancy RN,  April 07, 2010 3:54 PM  Additional Follow-up for Phone Call Additional follow up Details #1::        Called pt back and told her we would probably need to see her on Monday to discuss changing her meds.  Her diarrhea is still bad.  Told her it may take a while to get the medication out fo her system.  Until it resolves she needs to be taking in a bland diet and plenty of fluids.  I will call her on Monday to see is she's any better and will attempt to get he in to Dr. [facility].  Pt agreeable. Additional Follow-up by: Dennison Nancy RN,  April 07, 2010 4:53 PM    Additional Follow-up for Phone Call Additional follow up Details #2::    Heard back from Dr. Karie Schwalbe.  His advise is to continue the metformin but to take Imodium along with it.  Pt agreeable to this.  I will call her back on Monday to see if she's any better.  Follow-up by: Dennison Nancy RN,  April 07, 2010 4:58 PM  Additional Follow-up for Phone Call Additional follow up Details #3:: Details for Additional Follow-up Action Taken: Thanks, yes diarrhea is common with metformin, will improve with time, she can cont metformin or wait to discuss with me.  Ok for immodium as needed for diarrhea.  Thanks. :-) Additional Follow-up by: Rodney Langton MD,  April 07, 2010 10:50 PM    Called pt this am to check on her.  No answer so left VMM to call us if she's not feeling any  better.  Dennison Nancy RN  April 10, 2010 9:58 AM

## 2010-07-11 NOTE — Assessment & Plan Note (Signed)
 Summary: meet new md wp   Vital Signs:  Patient Profile:   55 Years Old Female Weight:      228.5 pounds Temp:     98.5 degrees F Pulse rate:   96 / minute Pulse (ortho):   84 / minute BP sitting:   135 / 87  (left arm) BP standing:   142 / 90  Pt. in pain?   no  Vitals Entered By: LETITIA REUSING (January 08, 2008 3:54 PM)              Is Patient Diabetic? Yes      Serial Vital Signs/Assessments:  Time      Position  BP       Pulse  Resp  Temp     By 5:08 PM   Lying RA  141/88   80                    ADINA GOULD 5:08 PM   Sitting   147/95   87                    ADINA GOULD 5:08 PM   Standing  142/90   84                    ADINA GOULD   PCP:  DEBBY PETTIES MD  Chief Complaint:  rov.  History of Present Illness: 55 year old female with a history of H. pylori positive and PUD, HTN, chronic ankle pain, with 2 weeks history of feeling lightheaded and dizzy.  Happened 2 times over the past 2 weeks, she bent over to pick something up from work and stood up and felt dizzy.  Never passed out and never felt like the room was spinning.  Also says that feels pain in epigastrium when she eats food, no radiation of pain.  Denies HA, weakness, N/V/C/D, melena, hematochezia, CP, SOB, syncope.    Current Allergies: CODEINE  PHOSPHATE (CODEINE  PHOSPHATE)  Past Medical History:    2/03 - nl Tsh & free T4, 3/03 - thyroid U/S - mild diffuse enlargement of, 7/04- Cr 0.7, both lobes without discrete solid or cystic masses, Enlarged thyroid, hyperopic astigmatism and presbyopia, retrocalcaneal bursitis        H. Pylori positive      Physical Exam  General:     Well-developed,well-nourished, appears ill,appropriate and cooperative throughout examination Head:     Normocephalic and atraumatic without obvious abnormalities. No apparent alopecia or balding. Eyes:     No corneal or conjunctival inflammation noted. EOMI. Perrla. Mouth:     Oral mucosa and oropharynx without lesions  or exudates. Moist. Chest Wall:     No deformities, masses, or tenderness noted. Lungs:     Normal respiratory effort, chest expands symmetrically. Lungs are clear to auscultation, no crackles or wheezes. Heart:     Normal rate and regular rhythm. S1 and S2 normal without gallop, murmur, click, rub or other extra sounds. Abdomen:     Bowel sounds positive,abdomen soft and without masses, organomegaly or hernias noted.  Pt does have significant tenderness to palpation in epigastrium. Rectal:     No external abnormalities noted. Normal sphincter tone. No rectal masses or tenderness. FOBT negative. Pulses:     R and L carotid,radial,femoral,dorsalis pedis and posterior tibial pulses are full and equal bilaterally Extremities:     No clubbing, cyanosis, edema, or deformity noted with normal full range of motion of all joints.  Impression & Recommendations:  Problem # 1:  Hx of POSTURAL LIGHTHEADEDNESS (ICD-780.4) 1. Likely due to mild dehydration secondary to anorexia from ulcer pain.  Unlikely cardiac or inner ear in etiology. 2. Drew labs to rule out cholecystitis, severe anemia, pancreatitis, H/H was normal. 3. Will have pt take prilosec OTC for 6 weeks 4. Recommended oral hydration at least 8 glasses per day 5. I will have her see me in one week to reasses and f/u labs. 6. Instructed to go to ER if any Chest pain, shortness of breath, nausea with vomiting, or blood in stool, or black tarry stools.   Orders: CBC-[facility] (14972) Basic Met-[facility] [phone]) Hepatic-[facility] [phone]) Amylase-[facility] [phone]) Lipase-[facility] [phone]) [facility]- Est  Level 4 (00785)   Problem # 2:  HYPERTENSION, BENIGN SYSTEMIC (ICD-401.1) 1. Refilled medication 2. Was a little high today. 3. will reassess when pt returns  Her updated medication list for this problem includes:    Lisinopril -hydrochlorothiazide  20-12.5 Mg Tabs (Lisinopril -hydrochlorothiazide ) .[name]. Take one tablet daily for blood  pressure   Problem # 3:  ANKLE PAIN, BILATERAL (ICD-719.47) Currently resolved.  Complete Medication List: 1)  Lisinopril -hydrochlorothiazide  20-12.5 Mg Tabs (Lisinopril -hydrochlorothiazide ) .... Take one tablet daily for blood pressure 2)  Pravachol  80 Mg Tabs (Pravastatin  sodium) .... One tablet daily at bedtime for cholesterol control 3)  Prilosec  .... 40mg  po daily for 6 weeks   Patient Instructions: 1)  It was nice to meet you today, I am concerned about your ulcer. 2)  You will need to go to your local [facility] and get Prilosec OTC and take as directed today. 3)  I also want you to hydrate yourself with at least 8 glasses of water each day. 4)  Please have the nurse schedule you to see me again in one week. 5)  If you pass out or have any Chest pain, shortness of breath, nausea with vomiting, or blood in stool, or black tarry stools then you need to go to the ER immediately.   Prescriptions: LISINOPRIL -HYDROCHLOROTHIAZIDE  20-12.5 MG  TABS (LISINOPRIL -HYDROCHLOROTHIAZIDE ) Take one tablet daily for blood pressure  #34 x 3   Entered and Authorized by:   DEBBY PETTIES MD   Signed by:   DEBBY PETTIES MD on 01/08/2008   Method used:   Faxed to ...       [facility]       [address]       [place], [place]  [address]       Ph: [phone]       Fax: [phone]   RxID:   [id number] PRILOSEC 40mg  PO daily for 6 weeks  #30 x 3   Entered and Authorized by:   DEBBY PETTIES MD   Signed by:   DEBBY PETTIES MD on 01/08/2008   Method used:   Faxed to ...       [facility]       [address]       [place], [place]  [address]       Ph: [phone]       Fax: [phone]   RxID:   [id number]  ] Laboratory Results   Blood Tests   Date/Time Received: January 08, 2008 5:14 PM  Date/Time Reported: January 08, 2008 5:14 PM     CBC   HGB:  11.4 g/dL   (Normal Range: 86.9-82.9 in Males,  12.0-15.0 in  Females) CommentsBETHA GIOVANNA ADA CMA  January 08, 2008 5:14 PM

## 2010-07-11 NOTE — Progress Notes (Signed)
Summary: phn msg  Phone Note Call from Patient Call back at Home Phone [phone]   Caller: Patient Summary of Call: has questions about meds that she just got. Initial call taken by: De Nurse,  January 26, 2010 3:00 PM  Follow-up for Phone Call        states the iron makes her her nauseous. her muscles are sore today. says it is from the antiitch pill or her cholesterol pill. told her she has been on the cholesterol pill for a long time. told her I will ask pcp & will call her with his response Follow-up by: Golden Circle RN,  January 26, 2010 3:04 PM  Additional Follow-up for Phone Call Additional follow up Details #1::        Iron can cause some stomch upset, she should try to stick with it for another week (pills are TID), if she cannot tolerate this I will switch to Iron sucrose which is less upsetting to the stomach but doesn't work as well.  Agreed with cholesterol pill, has been taking it a long time so unlikely the source.  Hydroxyzine also doesn't cause muscle soreness, is it possible she just slept wrong?  Stay on all meds and see if symptoms improve in 1 week.  Do not miss doses. Additional Follow-up by: Rodney Langton MD,  January 27, 2010 9:03 AM    Additional Follow-up for Phone Call Additional follow up Details #2::    says she stopped the cholesterol med & has had no more muscle soreness. appt with pcp tomorrow to discuss Follow-up by: Golden Circle RN,  January 30, 2010 8:51 AM  Additional Follow-up for Phone Call Additional follow up Details #3:: Details for Additional Follow-up Action Taken: Noted. will see tomo. Additional Follow-up by: Rodney Langton MD,  January 30, 2010 11:39 AM

## 2010-07-11 NOTE — Letter (Signed)
Summary: Generic Letter  [name]  [address]   [place], [place] [address]   Phone: [phone]  Fax: [phone]    08/25/2009  ALEXZA NORBECK [address] [place], [place]  [address]  Dear Ms. SHAW,   You are overdue for some preventive medical tests.  Please make an appt to see me as soon as is convenient for you.     Sincerely,   Rodney Langton MD

## 2010-07-11 NOTE — Letter (Signed)
Summary: Handout Printed  Printed Handout:  - Diabetes Self Education-[facility] 

## 2010-07-11 NOTE — Miscellaneous (Signed)
  Clinical Lists Changes  Problems: Removed problem of MYALGIA (ICD-729.1) Removed problem of ANEMIA (ICD-285.9) Removed problem of DIZZINESS (ICD-780.4) Removed problem of COSTOCHONDRITIS (ICD-733.6) Removed problem of ACHILLES TENDINITIS (ICD-726.71) Removed problem of LOW BACK PAIN, ACUTE (ICD-724.2) Removed problem of History of  POSTURAL LIGHTHEADEDNESS (ICD-780.4) Removed problem of LATERAL EPICONDYLITIS, RIGHT (ICD-726.32) Removed problem of Question of  ARTHRITIS, RHEUMATOID (ICD-714.0) Removed problem of SCREENING MAMMOGRAM NEC (ICD-V76.12) Removed problem of INADEQUATE MATERIAL RESOURCES (ICD-V60.2) Removed problem of SCREENING FOR LIPOID DISORDERS (ICD-V77.91) Removed problem of OSTEOARTHRITIS, LOWER LEG (ICD-715.96)

## 2010-07-11 NOTE — Progress Notes (Signed)
 Summary: returning call  Phone Note Call from Patient Call back at [phone]   Reason for Call: Talk to Nurse Summary of Call: pt is returning MDs call, not sure what it was about. Initial call taken by: ERIN ODELL,  January 21, 2008 9:33 AM  Follow-up for Phone Call        will forward message to MD. Follow-up by: AVELINA SHARPS RN,  January 21, 2008 11:07 AM    Thanks for the update, let me know if she calls back for anything, as I'm not sure what she needed and I did not call her.  -Dr. ONEIDA.

## 2010-07-11 NOTE — Assessment & Plan Note (Signed)
Summary: f/u eo   Vital Signs:  Patient profile:   55 year old female Weight:      236 pounds Temp:     98.7 degrees F oral Pulse rate:   69 / minute Pulse rhythm:   regular BP sitting:   116 / 80  (left arm) Cuff size:   large  Vitals Entered By: Loralee Pacas CMA (May 08, 2010 9:15 AM) CC: follow-up visit Is Patient Diabetic? No   Primary Care Provider:  Rodney Langton MD  CC:  follow-up visit.  History of Present Illness: 55 yo female here for fu,.  HLD:  intolerant to Zocor, willing to try pravachol.  Fibrates not available at [facility] and need to be ordered.  Last FLP with LDL high.  GERD:  Symptoms much improved with prilosec 20 two times a day.  Pt would like to increase to 40 two times a day.  DM2:  Pt had some diarrhea with metformin but this is resolved now.  Habits & Providers  Alcohol-Tobacco-Diet     Alcohol drinks/day: 0     Tobacco Status: quit > 6 months     Year Quit: 2002     Passive Smoke Exposure: no  Exercise-Depression-Behavior     Have you felt down or hopeless? no     Have you felt little pleasure in things? no     Depression Counseling: not indicated; screening negative for depression     Seat Belt Use: always  Current Medications (verified): 1)  Lisinopril-Hydrochlorothiazide 20-12.5 Mg  Tabs (Lisinopril-Hydrochlorothiazide) .... Take One Tablet Daily For Blood Pressure 2)  Pravastatin Sodium 20 Mg Tabs (Pravastatin Sodium) .... One Tab By Mouth Qhs 3)  Celexa 10 Mg Tabs (Citalopram Hydrobromide) .... One Tab By Mouth Qhs For A Week, Then 2 Tabs By Mouth Qhs 4)  Ferrous Sulfate 325 (65 Fe) Mg Tbec (Ferrous Sulfate) .... One Tab By Mouth Tid 5)  Colace 100 Mg Caps (Docusate Sodium) .... One By Mouth Two Times A Day As Needed For Constipation. 6)  Hydroxyzine Hcl 25 Mg Tabs (Hydroxyzine Hcl) .... One Tab By Mouth Qhs 7)  Metformin Hcl 500 Mg Tabs (Metformin Hcl) .... One Tab By Mouth Two Times A Day 8)  Aspirin 81 Mg Tbec (Aspirin)  .... One Tab By Mouth Daily 9)  Prilosec 40 Mg Cpdr (Omeprazole) .... One Tab By Mouth in The Morning and Then in The Early Evening.  Allergies (verified): 1)  Codeine Phosphate (Codeine Phosphate)  Social History: Risk analyst Use:  always  Review of Systems       See HPI  Physical Exam  General:  Well-developed,well-nourished,in no acute distress; alert,appropriate and cooperative throughout examination Lungs:  Normal respiratory effort, chest expands symmetrically. Lungs are clear to auscultation, no crackles or wheezes. Heart:  Normal rate and regular rhythm. S1 and S2 normal without gallop, murmur, click, rub or other extra sounds.   Impression & Recommendations:  Problem # 1:  GERD (ICD-530.81) Assessment Improved Increased PPI. RTC as needed.  Her updated medication list for this problem includes:    Prilosec 40 Mg Cpdr (Omeprazole) ..... One tab by mouth in the morning and then in the early evening.  Orders: [facility]- Est  Level 4 (81191)  Problem # 2:  HYPERLIPIDEMIA (ICD-272.4) Assessment: Unchanged Pt amenable to trying pravachol, avail at [facility]. Will hold niacin for now. Recheck FLP in 3 months.  The following medications were removed from the medication list:    Niacin 500 Mg Tabs (Niacin) .Marland KitchenMarland KitchenMarland KitchenMarland Kitchen  One by mouth qhs x 7d, then two tabs by mouth qhs Her updated medication list for this problem includes:    Pravastatin Sodium 20 Mg Tabs (Pravastatin sodium) ..... One tab by mouth qhs  Orders: [facility]- Est  Level 4 (33295)  Problem # 3:  DIABETES MELLITUS II, UNCOMPLICATED (ICD-250.00) Assessment: Unchanged Pt to cont current regimen. RTC to recheck A1c 3 months after last one.  Her updated medication list for this problem includes:    Lisinopril-hydrochlorothiazide 20-12.5 Mg Tabs (Lisinopril-hydrochlorothiazide) .Marland Kitchen... Take one tablet daily for blood pressure    Metformin Hcl 500 Mg Tabs (Metformin hcl) ..... One tab by mouth two times a day    Aspirin 81 Mg Tbec  (Aspirin) ..... One tab by mouth daily  Orders: [facility]- Est  Level 4 (18841)  Problem # 4:  HYPERTENSION, BENIGN SYSTEMIC (ICD-401.1) Assessment: Improved BP well controlled on manual recheck.  Her updated medication list for this problem includes:    Lisinopril-hydrochlorothiazide 20-12.5 Mg Tabs (Lisinopril-hydrochlorothiazide) .Marland Kitchen... Take one tablet daily for blood pressure  Orders: [facility]- Est  Level 4 (99214)  Complete Medication List: 1)  Lisinopril-hydrochlorothiazide 20-12.5 Mg Tabs (Lisinopril-hydrochlorothiazide) .... Take one tablet daily for blood pressure 2)  Pravastatin Sodium 20 Mg Tabs (Pravastatin sodium) .... One tab by mouth qhs 3)  Celexa 10 Mg Tabs (Citalopram hydrobromide) .... One tab by mouth qhs for a week, then 2 tabs by mouth qhs 4)  Ferrous Sulfate 325 (65 Fe) Mg Tbec (Ferrous sulfate) .... One tab by mouth tid 5)  Colace 100 Mg Caps (Docusate sodium) .... One by mouth two times a day as needed for constipation. 6)  Hydroxyzine Hcl 25 Mg Tabs (Hydroxyzine hcl) .... One tab by mouth qhs 7)  Metformin Hcl 500 Mg Tabs (Metformin hcl) .... One tab by mouth two times a day 8)  Aspirin 81 Mg Tbec (Aspirin) .... One tab by mouth daily 9)  Prilosec 40 Mg Cpdr (Omeprazole) .... One tab by mouth in the morning and then in the early evening.  Patient Instructions: 1)  Increased prilosec to 40 two times a day  2)  Changed cholesterol medicine to pravastatin 20mg  daily, let me know if you have muscle aches on this. 3)  Come back to see me at the end of Jan or beginning of Feb 2012. 4)  -Dr. Karie Schwalbe. Prescriptions: PRILOSEC 40 MG CPDR (OMEPRAZOLE) One tab by mouth in the morning and then in the early evening.  #60 x 6   Entered and Authorized by:   Rodney Langton MD   Signed by:   Rodney Langton MD on 05/08/2010   Method used:   Print then Give to Patient   RxID:   [id number] PRAVASTATIN SODIUM 20 MG TABS (PRAVASTATIN SODIUM) One tab by mouth qHS  #30 x 2    Entered and Authorized by:   Rodney Langton MD   Signed by:   Rodney Langton MD on 05/08/2010   Method used:   Print then Give to Patient   RxID:   [id number]    Orders Added: 1)  [facility]- Est  Level 4 [62831]      Last Mammogram:  ASSESSMENT: Negative - BI-RADS 1^MS DIGITAL SCREENING (08/17/2009 2:06:00 PM) Mammogram Next Due:  1 yr Last HGBA1C:  7.1 (04/03/2010 2:26:55 PM) HGBA1C Next Due:  3 mo Last Creatinine:  0.73 (04/03/2010 8:38:00 PM) Creatinine Next Due: 1 yr Last Potassium:  4.3 (04/03/2010 8:38:00 PM) Potassium Next Due:  1 yr

## 2010-07-11 NOTE — Assessment & Plan Note (Signed)
 Summary: multiple issues/dcb   Vital Signs:  Patient profile:   55 year old female Weight:      226.4 pounds Temp:     98.6 degrees F oral Pulse rate:   68 / minute BP sitting:   122 / 84  (left arm)  Vitals Entered By: Letitia Reusing (August 26, 2008 2:51 PM) Last Mammogram:  ASSESSMENT: Negative - BI-RADS 1^MS DIGITAL SCREENING (04/21/2008 4:53:00 PM) Mammogram Next Due:  1 yr Diabetic Foot Exam Date:  08/26/2008 Diabetes Foot Check Result:  normal Diabetes Foot Check Next Due:  1 yr Foot Care Education Date:  08/26/2008 Foot Care Education:  completed Foot Care Education Due:  1 yr  CC: multiple issues   History of Present Illness: 37F with HTN, HLD, DM2, dyspepsia with c/o msk pain.  MSK: pain on lateral epicondyle worsened by wrist extension and palpation, also pain in both ankles worsened by walking and palpation, associated with swelling.  Ibuprofen  helps a little.  No fevers, chills.  HTN: Taking her meds as prescribed.  HLD: Taking meds as prescribed.  Dyspepsia:  Symptoms have completely resolved since switching to famotidine and sucralfate.  No N/V/D, no melena or hematochezia.  Habits & Providers     Does Patient Exercise: no     Exercise Counseling: to improve exercise regimen  Clinical Review Panels:  Prevention   Last Mammogram:  ASSESSMENT: Negative - BI-RADS 1^MS DIGITAL SCREENING (04/21/2008)   Last Pap Smear:  Hx of Hysterectomy (03/19/2008)  Lipid Management   Cholesterol:  137 (01/30/2008)   LDL (bad choesterol):  70 (01/30/2008)   HDL (good cholesterol):  55 (01/30/2008)  Diabetes Management   HgBA1C:  6.7 (01/30/2008)   Creatinine:  0.61 (01/08/2008)   Microalbumin (urine):  NEGATIVE (05/22/2007)   Last Dilated Eye Exam:  normal (06/25/2006)   Last Foot Exam:  yes (08/26/2008)   Last Flu Vaccine:  given (03/05/2008)   Allergies: 1)  Codeine  Phosphate (Codeine  Phosphate)  Past History:  Past Medical History:    2/03 - nl Tsh & free  T4, 3/03 - thyroid U/S - mild diffuse enlargement of, 7/04- Cr 0.7, both lobes without discrete solid or cystic masses, Enlarged thyroid, hyperopic astigmatism and presbyopia, retrocalcaneal bursitis    H. Pylori positive (01/08/2008) PMH reviewed for relevance  Review of Systems       12 point negative except as in HPI  Physical Exam  General:  Well-developed,well-nourished,in no acute distress; alert,appropriate and cooperative throughout examination Lungs:  Normal respiratory effort, chest expands symmetrically. Lungs are clear to auscultation, no crackles or wheezes. Heart:  Normal rate and regular rhythm. S1 and S2 normal without gallop, murmur, click, rub or other extra sounds. Abdomen:  Bowel sounds positive,abdomen soft and non-tender without masses, organomegaly or hernias noted. Msk:  TTP left lateral epicondyle over extensor origin, worsened by wrist extension.  Swelling over bilateral ankles, TTP ant and post to both malleoli, ankle tilt negative, squeeze test negative, thompsons negative, ant drawer negative, strength normal.  Pain with plantar/dorsiflexion. Skin:  Intact without suspicious lesions or rashes  Diabetes Management Exam:    Foot Exam (with socks and/or shoes not present):       Sensory-Pinprick/Light touch:          Left medial foot (L-4): normal          Left dorsal foot (L-5): normal          Left lateral foot (S-1): normal  Right medial foot (L-4): normal          Right dorsal foot (L-5): normal          Right lateral foot (S-1): normal       Sensory-Monofilament:          Left foot: normal          Right foot: normal       Inspection:          Left foot: normal          Right foot: normal       Nails:          Left foot: normal          Right foot: normal    Impression & Recommendations:  Problem # 1:  GERD (ICD-530.81) Assessment Improved Symptoms resolved, continue current treatment.   Her updated medication list for this problem  includes:    Famotidine 40 Mg Tabs (Famotidine) ..... One tab by mouth qhs    Sucralfate 1 Gm Tabs (Sucralfate) ..... One tab by mouth daily  Orders: [facility]- Est  Level 4 (00785)  Problem # 2:  LATERAL EPICONDYLITIS, RIGHT (ICD-726.32) Assessment: Deteriorated Overuse from mopping, voltaren  for ankles should help her elbow as well.  FU 3 months or earlier if no improvement.  Orders: [facility]- Est  Level 4 (00785)  Problem # 3:  ? of ARTHRITIS, RHEUMATOID (ICD-714.0) Assessment: Deteriorated Will use ESR as screen for rheumatoid condition.  Likely ankle pain is DJD or retrocalcaneal bursitis but could represent gout/pseudogout.  Will try Voltaren , FU 3 months or earlier i no improvement.  Her updated medication list for this problem includes:    Voltaren  75 Mg Tbec (Diclofenac  sodium) ..... One tab by mouth bid  Orders: Uric Acid-[facility] (15449-76819) [facility]- Est  Level 4 (99214) Sed Rate (ESR)-[facility] (14348)  Problem # 4:  HYPERTENSION, BENIGN SYSTEMIC (ICD-401.1) Assessment: Improved Well controlled.   Her updated medication list for this problem includes:    Lisinopril -hydrochlorothiazide  20-12.5 Mg Tabs (Lisinopril -hydrochlorothiazide ) .[name]. Take one tablet daily for blood pressure  Orders: Urinalysis-[facility] (00000) [facility]- Est  Level 4 (99214)  Problem # 5:  DIABETES MELLITUS II, UNCOMPLICATED (ICD-250.00) Will check HbA1c to assess control, also UA, Eye exam.  Her updated medication list for this problem includes:    Lisinopril -hydrochlorothiazide  20-12.5 Mg Tabs (Lisinopril -hydrochlorothiazide ) .[name]. Take one tablet daily for blood pressure  Orders: A1C-[facility] (16963) Urinalysis-[facility] (00000) [facility]- Est  Level 4 (00785)  Complete Medication List: 1)  Lisinopril -hydrochlorothiazide  20-12.5 Mg Tabs (Lisinopril -hydrochlorothiazide ) .... Take one tablet daily for blood pressure 2)  Pravachol  80 Mg Tabs (Pravastatin  sodium) .... One tablet daily at bedtime for cholesterol control 3)   Diphenhydramine Hcl 50 Mg Tabs (Diphenhydramine hcl) .... One tablet by mouth at bedtime 4)  Famotidine 40 Mg Tabs (Famotidine) .... One tab by mouth qhs 5)  Sucralfate 1 Gm Tabs (Sucralfate) .... One tab by mouth daily 6)  Voltaren  75 Mg Tbec (Diclofenac  sodium) .... One tab by mouth bid  Patient Instructions: 1)  Good to see you today, 2)  It seems that you may have a form of overuse arthritis in your ankles, and lateral epicondylitis in your right arm (aka tennis elbow).  I want to check some labs: Uric Acid, ESR, HBA1c, Urinalysis, and do another hemoccult card. 3)  Come see me again in 3 months or sooner if your ankles don't get better. 4)  I will prescribe Voltaren  75mg  BID. 5)  I also need you  to see your eye doctor. 6)  Try Soy milk, it may help with your bloating. 7)  -Dr. ONEIDA. 8)  The medication list was reviewed and reconciled.  All changed / newly prescribed medications were explained.  A complete medication list was provided to the patient / caregiver. Prescriptions: SUCRALFATE 1 GM TABS (SUCRALFATE) One tab by mouth daily  #31 x 3   Entered and Authorized by:   Debby Petties MD   Signed by:   Debby Petties MD on 08/26/2008   Method used:   Electronically to        [facility] (retail)       [address]       [place], [place]  [address]       Ph: [phone]       Fax: [phone]   RxID:   [id number] FAMOTIDINE 40 MG TABS (FAMOTIDINE) One tab by mouth qHS  #31 x 3   Entered and Authorized by:   Debby Petties MD   Signed by:   Debby Petties MD on 08/26/2008   Method used:   Electronically to        [facility] (retail)       [address]       [place], [place]  [address]       Ph: [phone]       Fax: [phone]   RxID:   [id number] DIPHENHYDRAMINE HCL 50 MG TABS (DIPHENHYDRAMINE HCL) ONE TABLET BY MOUTH at bedtime  #30 x 0   Entered and Authorized by:   Debby Petties MD   Signed by:   Debby Petties MD on 08/26/2008   Method used:   Electronically to        [facility] (retail)       [address]       [place], [place]  [address]       Ph: [phone]       Fax: [phone]   RxID:   [id number] PRAVACHOL  80 MG  TABS (PRAVASTATIN  SODIUM) One tablet daily at bedtime for cholesterol control  #34 x 3   Entered and Authorized by:   Debby Petties MD   Signed by:   Debby Petties MD on 08/26/2008   Method used:   Electronically to        [facility] (retail)       [address]       [place], [place]  [address]       Ph: [phone]       Fax: [phone]   RxID:   [id number] LISINOPRIL -HYDROCHLOROTHIAZIDE  20-12.5 MG  TABS (LISINOPRIL -HYDROCHLOROTHIAZIDE ) Take one tablet daily for blood pressure  #34 x 3   Entered and Authorized by:   Debby Petties MD   Signed by:   Debby Petties MD on 08/26/2008   Method used:   Electronically to        [facility] (retail)       [address]       [place], [place]  [address]       Ph: [phone]       Fax: [phone]   RxID:   [id number] VOLTAREN  75 MG TBEC (DICLOFENAC  SODIUM) One  tab by mouth BID  #28 x 3   Entered and Authorized by:   Debby Petties MD   Signed by:   Debby Petties MD on 08/26/2008   Method used:   Electronically to        [facility] (retail)       [address]       [place], [place]  [address]       Ph: [phone]       Fax: [phone]   RxID:   [id number]   Appended Document: Orders Update    Clinical Lists Changes  Orders: Added new Referral order of Ophthalmology Referral (Ophthalmology) - Signed      Appended Document: UA, A1c & ESR results  Laboratory Results   Urine Tests  Date/Time Received: August 26, 2008 3:41 PM  Date/Time Reported: August 26, 2008 4:56  PM   Routine Urinalysis   Color: yellow Appearance: Clear Glucose: negative   (Normal Range: Negative) Bilirubin: negative   (Normal Range: Negative) Ketone: trace (5)   (Normal Range: Negative) Spec. Gravity: 1.020   (Normal Range: 1.003-1.035) Blood: negative   (Normal Range: Negative) pH: 7.0   (Normal Range: 5.0-8.0) Protein: trace   (Normal Range: Negative) Urobilinogen: >=8.0   (Normal Range: 0-1) Nitrite: negative   (Normal Range: Negative) Leukocyte Esterace: negative   (Normal Range: Negative)     Blood Tests   Date/Time Received: August 26, 2008 3:41 PM  Date/Time Reported: August 26, 2008 4:57 PM   HGBA1C: 6.6%   (Normal Range: Non-Diabetic - 3-6%   Control Diabetic - 6-8%) SED rate: 27 mm/hr  Comments: ...........test performed by...........SABRAArland Morel, CMA

## 2010-07-11 NOTE — Assessment & Plan Note (Signed)
 Summary: tired, no energy,tcb   Vital Signs:  Patient profile:   55 year old female Weight:      226.8 pounds Temp:     98.5 degrees F oral Pulse rate:   85 / minute BP sitting:   132 / 84  (left arm)  Vitals Entered By: Letitia Reusing (December 06, 2008 10:00 AM) CC: tired,decreased energy Is Patient Diabetic? Yes   Primary Care Provider:  DEBBY PETTIES MD  CC:  tired and decreased energy.  History of Present Illness: 39F with Hx DM2, HTN, PUD, and c/o right ankle pain.  DM2: Diet controlled.    HTN:  Taking meds as prescribed, no CP, SOB, HA, numbness, weakness, syncope.  PUD:  Symptoms completely controlled with Famotidine 40mg  qHS, however was taking Diclofenac  for ankle pain and this worsened her PUD pain. No melena, hematochezia, N/V.  Right ankle pain:  Has been present for years but worse today, present mostly in the right ankle just proximal to insertion of calcaneal tendon.  Ambulatory, worsened by spending long hours on her feet.  Associated with swelling of ankle.  Made better by rest and diclofenac .  Has had XR done last year (4/09)  that showed enthesophytic changes at the insertion of the Achilles tendon.  Pain worst with palpation directly over insertion and with plantarflexion of foot.    Habits & Providers  Alcohol-Tobacco-Diet     Tobacco Status: never  Allergies: 1)  Codeine  Phosphate (Codeine  Phosphate)  Past History:  Past Medical History: Last updated: 01/08/2008 2/03 - nl Tsh & free T4, 3/03 - thyroid U/S - mild diffuse enlargement of, 7/04- Cr 0.7, both lobes without discrete solid or cystic masses, Enlarged thyroid, hyperopic astigmatism and presbyopia, retrocalcaneal bursitis  H. Pylori positive  Past Surgical History: Last updated: September 03, 2006 stress test 1/04- 70% EF, poor ex. Tolerance - 07/30/2002, total  hysterectomy - 95 - 12/18/2000  Family History: Last updated: 2006-09-03 father died of MI at 58;, mother and brother with  DM-2;  Social History: Last updated: 09/03/2006 lives with husband, and her son; works in housekeeping at [employer]; poor diet; no etoh/drugs/cigs;; changed name (2003) [name]-> Brandi Davenport; Job causing headaches, anxiet, dizziness.; Leaving her husband 9-05?  Son in [place].  Review of Systems       See HPI  Physical Exam  General:  Well-developed,well-nourished,in no acute distress; alert,appropriate and cooperative throughout examination Lungs:  Normal respiratory effort, chest expands symmetrically. Lungs are clear to auscultation, no crackles or wheezes. Heart:  Normal rate and regular rhythm. S1 and S2 normal without gallop, murmur, click, rub or other extra sounds. Abdomen:  Bowel sounds positive,abdomen soft and non-tender without masses, organomegaly or hernias noted. Msk:  Mild swelling, non-pitting surrounding ankles bilaterally.  No erythema.  TTP over achilles insertion, no point tenderness around malleoli, navicular, or other parts of right foot.  Ant drawer neg, squeeze test neg, no pain with inversion/eversion.  Significant pain reproducible with passive plantarflexion of right foot.  No joint warmth.    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  Will stop diclofenac  as this is worsening her PUD and start tramadol  for now.  Will likely need heel cups, eccentric stretching, PT, possibly nitro patches.  Will refer to sports medicine for further management of chronic achilles tendonitis.  Of note, she has  had an elevated ESR and CRP in the past with a negative rheumatoid factor, no Hx rheumatoid disease in the family.    Orders: Sports Medicine (Sports Med) [facility]- Est  Level 4 [id number])  Problem # 2:  GERD (ICD-530.81) Assessment: Deteriorated Symptoms worsened by  NSAIDS, will stop diclofenac , continue Famotidine and sucralfate at current dosages.  Hb is normal at 12.0 so it is unlikely that she is bleeding and becoming anemic.  Her updated medication list for this problem includes:    Famotidine 20 Mg Tabs (Famotidine) .[name]. 2 tabs by mouth qhs    Sucralfate 1 Gm Tabs (Sucralfate) ..... One tab by mouth daily  Orders: Hemoglobin-[facility] (14981) [facility]- Est  Level 4 (00785)  Problem # 3:  HYPERLIPIDEMIA (ICD-272.4) Assessment: Unchanged Continue current tx regimen.  Lipids stable.  Her updated medication list for this problem includes:    Pravachol  80 Mg Tabs (Pravastatin  sodium) ..... One tablet daily at bedtime for cholesterol control  Problem # 4:  HYPERTENSION, BENIGN SYSTEMIC (ICD-401.1) Assessment: Unchanged Has been controlled, slightly high SBP on this visit but no changes at this time, if still high at next visit can increase Combo med.     Her updated medication list for this problem includes:    Lisinopril -hydrochlorothiazide  20-12.5 Mg Tabs (Lisinopril -hydrochlorothiazide ) .[name]. Take one tablet daily for blood pressure  Orders: [facility]- Est  Level 4 (99214)  Problem # 5:  DIABETES MELLITUS II, UNCOMPLICATED (ICD-250.00) Assessment: Improved HbA1c 6.7% at this visit, still diet controlled, no changes needed at this time.  Microalbumin negative.  Her updated medication list for this problem includes:    Lisinopril -hydrochlorothiazide  20-12.5 Mg Tabs (Lisinopril -hydrochlorothiazide ) .[name]. Take one tablet daily for blood pressure  Orders: A1C-[facility] (16963) UA Microalbumin-[facility] (82044) [facility]- Est  Level 4 (00785)  Problem # 6:  Preventive Health Care (ICD-V70.0) Assessment: Comment Only Pt getting insurance so will be able to order colonoscopy.  She will call office when insurance set up.  Also working with office to set up diabetic eye exam.  Complete Medication List: 1)  Lisinopril -hydrochlorothiazide  20-12.5 Mg Tabs  (Lisinopril -hydrochlorothiazide ) .... Take one tablet daily for blood pressure 2)  Pravachol  80 Mg Tabs (Pravastatin  sodium) .... One tablet daily at bedtime for cholesterol control 3)  Diphenhydramine Hcl 50 Mg Tabs (Diphenhydramine hcl) .... One tablet by mouth at bedtime 4)  Famotidine 20 Mg Tabs (Famotidine) .... 2 tabs by mouth qhs 5)  Sucralfate 1 Gm Tabs (Sucralfate) .... One tab by mouth daily 6)  Tramadol  Hcl 50 Mg Tabs (Tramadol  hcl) .... One tab by mouth q4-6h as needed for pain  Patient Instructions: 1)  Great to see you today, 2)  I will refill your BP medicine and your ulcer medicine.   3)  I want you to call the office when you get your insurance set up so we can do the colonoscopy.   4)  You need to work with the office to get your diabetic eye exam. 5)  I will check your Hemoglobin and A1c today, as well as your urine.  6)  You have an insertional achilles tendinitis, I want you to see our sports medicine doctors for this.  I will give you some Tramadol  to help with the pain, stop the Diclofenac . 7)  Come back to see me in 6 months. Prescriptions: TRAMADOL  HCL 50 MG TABS (TRAMADOL  HCL) One tab by mouth q4-6h as needed for pain  #60 x 3   Entered and Authorized by:   Debby Petties MD  Signed by:   Debby Petties MD on 12/06/2008   Method used:   Electronically to        [facility] (retail)       [address]       [place], [place]  [address]       Ph: [phone]       Fax: [phone]   RxID:   [id number] LISINOPRIL -HYDROCHLOROTHIAZIDE  20-12.5 MG  TABS (LISINOPRIL -HYDROCHLOROTHIAZIDE ) Take one tablet daily for blood pressure  #90 x 3   Entered and Authorized by:   Debby Petties MD   Signed by:   Debby Petties MD on 12/06/2008   Method used:   Electronically to        [facility] (retail)       [address]       [place], [place]  [address]       Ph: [phone]       Fax:  [phone]   RxID:   [id number] FAMOTIDINE 20 MG TABS (FAMOTIDINE) 2 tabs by mouth qHS  #180 x 6   Entered and Authorized by:   Debby Petties MD   Signed by:   Debby Petties MD on 12/06/2008   Method used:   Electronically to        [facility] (retail)       [address]       [place], [place]  [address]       Ph: [phone]       Fax: [phone]   RxID:   [id number]   Last HGBA1C:  6.6 (08/26/2008 4:55:38 PM) HGBA1C Result Date:  12/06/2008 HGBA1C Next Due:  3 mo Microalbumin Result Date:  12/06/2008 Microalbumin Result:  normal Microalbumin Next Due:  1 yr    Laboratory Results   Urine Tests  Date/Time Received: December 06, 2008 10:48  AM  Date/Time Reported: December 06, 2008 11:03 AM   Microalbumin (urine): normal mg/L   Comments: ...........test performed by...........SABRAArland Morel, CMA   Blood Tests   Date/Time Received: December 06, 2008 10:48 AM  Date/Time Reported: December 06, 2008 11:03 AM   HGBA1C: 6.7%   (Normal Range: Non-Diabetic - 3-6%   Control Diabetic - 6-8%)   CBC   HGB:  12.0 g/dL   (Normal Range: 86.9-82.9 in Males, 12.0-15.0 in Females) Comments: ...........test performed by...........SABRAArland Morel, CMA

## 2010-07-11 NOTE — Letter (Signed)
Summary: Lipid Letter  [facility]  [address]   [place], [place] [address]   Phone: [phone]  Fax: [phone]    04/04/2010  Brandi Davenport [address] [place], [place]  [address]  Dear [name]:  We have carefully reviewed your last lipid profile from 02/15/2009 and the results are noted below with a summary of recommendations for lipid management.    LDL "bad" Cholesterol:   111     Goal: <100    Your bad cholesterol is a little high, because you have diabetes, we want this number below 100.  Please start taking 2 new medications (niacin and fenofibrate)  see below.  I have already sent these to the health department.   Also follow the below diet strategies.    TLC Diet (Therapeutic Lifestyle Change): Saturated Fats & Transfatty acids should be kept < 7% of total calories ***Reduce Saturated Fats Polyunstaurated Fat can be up to 10% of total calories Monounsaturated Fat Fat can be up to 20% of total calories Total Fat should be no greater than 25-35% of total calories Carbohydrates should be 50-60% of total calories Protein should be approximately 15% of total calories Fiber should be at least 20-30 grams a day ***Increased fiber may help lower LDL Total Cholesterol should be < 200mg /day Consider adding plant stanol/sterols to diet (example: Benacol spread) ***A higher intake of unsaturated fat may reduce Triglycerides and Increase HDL    Adjunctive Measures (may lower LIPIDS and reduce risk of Heart Attack) include: Aerobic Exercise (20-30 minutes 3-4 times a week) Limit Alcohol Consumption Weight Reduction Aspirin 75-81 mg a day by mouth (if not allergic or contraindicated) Dietary Fiber 20-30 grams a day by mouth     Current Medications: 1)    Lisinopril-hydrochlorothiazide 20-12.5 Mg  Tabs (Lisinopril-hydrochlorothiazide) .... Take one tablet daily for blood pressure 2)    Fenofibrate 160 Mg Tabs (Fenofibrate) .... One tab by mouth daily 3)     Celexa 10 Mg Tabs (Citalopram hydrobromide) .... One tab by mouth qhs for a week, then 2 tabs by mouth qhs 4)    Ferrous Sulfate 325 (65 Fe) Mg Tbec (Ferrous sulfate) .... One tab by mouth tid 5)    Colace 100 Mg Caps (Docusate sodium) .... One by mouth two times a day as needed for constipation. 6)    Hydroxyzine Hcl 25 Mg Tabs (Hydroxyzine hcl) .... One tab by mouth qhs 7)    Metformin Hcl 500 Mg Tabs (Metformin hcl) .... One tab by mouth two times a day 8)    Aspirin 81 Mg Tbec (Aspirin) .... One tab by mouth daily 9)    Prilosec 20 Mg Cpdr (Omeprazole) .... One tab by mouth two times a day 10)    Niacin 500 Mg Tabs (Niacin) .... One by mouth qhs x 7d, then two tabs by mouth qhs  If you have any questions, please call. We appreciate being able to work with you.   Sincerely,    [name] Rodney Langton MD  Appended Document: Lipid Letter mailed.

## 2010-07-11 NOTE — Miscellaneous (Signed)
  Clinical Lists Changes  Observations: Added new observation of LDL: 115 mg/dL (04/54/0981 19:14) Added new observation of HDL: 47 mg/dL (78/29/5621 30:86) Added new observation of TRIGLYC TOT: 78 mg/dL (57/84/6962 95:28) Added new observation of CHOLESTEROL: 178 mg/dL (41/32/4401 02:72)

## 2010-07-11 NOTE — Assessment & Plan Note (Signed)
 Summary: still not better,df   Vital Signs:  Patient profile:   55 year old female Height:      67 inches Weight:      228.5 pounds BMI:     35.92 Temp:     98.2 degrees F oral Pulse rate:   92 / minute BP sitting:   127 / 76  (left arm) Cuff size:   regular  Vitals Entered By: Chrissie Lerner CMA, (February 08, 2009 11:07 AM) CC: not better Is Patient Diabetic? Yes  Pain Assessment Patient in pain? yes     Location: back Intensity: 9   Primary Care Provider:  DEBBY PETTIES MD  CC:  not better.  History of Present Illness: 36F with MMP here with c/o LBP.  Has been present 3 weeks now.  She has recently been doing lots of heavy lifting.  No falls, no trauma however back has been very sore now, but pt continues to do heavy work.  No numbness, tingling, weakness.  Pain is localized over both paraspinal muscles from sacrum to mid thoracic spine.  Denies stool/.urinary incontinence.  Ambulatory.  No other complaints.  Habits & Providers  Alcohol-Tobacco-Diet     Tobacco Status: never  Allergies: 1)  Codeine  Phosphate (Codeine  Phosphate)  Past History:  Past Medical History: Last updated: 01/08/2008 2/03 - nl Tsh & free T4, 3/03 - thyroid U/S - mild diffuse enlargement of, 7/04- Cr 0.7, both lobes without discrete solid or cystic masses, Enlarged thyroid, hyperopic astigmatism and presbyopia, retrocalcaneal bursitis  H. Pylori positive  Past Surgical History: Last updated: 08-14-06 stress test 1/04- 70% EF, poor ex. Tolerance - 07/30/2002, total  hysterectomy - 95 - 12/18/2000  Family History: Last updated: 2006/08/14 father died of MI at 24;, mother and brother with DM-2;  Social History: Last updated: 08-14-2006 lives with husband, and her son; works in housekeeping at [employer]; poor diet; no etoh/drugs/cigs;; changed name (2003) [name]-> Loreli; Job causing headaches, anxiet, dizziness.; Leaving her husband 9-05?  Son in [place].  Review of Systems   See HPI  Physical Exam  General:  Well-developed,well-nourished,in no acute distress; alert,appropriate and cooperative throughout examination Neck:  No deformities, masses, or tenderness noted. Lungs:  Normal respiratory effort, chest expands symmetrically. Lungs are clear to auscultation, no crackles or wheezes. Heart:  Normal rate and regular rhythm. S1 and S2 normal without gallop, murmur, click, rub or other extra sounds. Msk:  Pt ambulatory, back TTP paraspinal, bilaterally from sacrum to mid thoracic spine.  Sensation and motor intact and 5/5 in bilateral upper and lower extremities.  ROM limited by pain in all directions.  No over paraspinal muscle spasm.  No step offs.  Non-tender over SI joints.     Impression & Recommendations:  Problem # 1:  LOW BACK PAIN, ACUTE (ICD-724.2) Assessment New PT states unable to tolerate tramadol .  No red flags, no trauma.  This is likely an overuse injury with muscle strain.  Tx is with analgesics.  Avoiding NSAIDS with her PUD.  Will give toradol  x1 and no further NSAIDS.  Short course of vicodin and cyclobenzaprine , heating pads, stretching, and avoiding ANY hreavy lifting.  Pt to RTC in 2 weeks to reassess.  The following medications were removed from the medication list:    Tramadol  Hcl 50 Mg Tabs (Tramadol  hcl) ..... One tab by mouth q4-6h as needed for pain Her updated medication list for this problem includes:    Cyclobenzaprine  Hcl 5 Mg Tabs (Cyclobenzaprine  hcl) ..... One tab by  mouth tid    Vicodin 5-500 Mg Tabs (Hydrocodone-acetaminophen ) ..... One tab by mouth q4h as needed pain  Orders: [facility]- Est Level  3 (00786) Ketorolac -Toradol  15mg  (G8114)  Complete Medication List: 1)  Lisinopril -hydrochlorothiazide  20-12.5 Mg Tabs (Lisinopril -hydrochlorothiazide ) .... Take one tablet daily for blood pressure 2)  Pravachol  80 Mg Tabs (Pravastatin  sodium) .... One tablet daily at bedtime for cholesterol control 3)  Diphenhydramine Hcl 50 Mg Tabs  (Diphenhydramine hcl) .... One tablet by mouth at bedtime 4)  Famotidine 20 Mg Tabs (Famotidine) .... 2 tabs by mouth qhs 5)  Sucralfate 1 Gm Tabs (Sucralfate) .... One tab by mouth daily 6)  Cyclobenzaprine  Hcl 5 Mg Tabs (Cyclobenzaprine  hcl) .... One tab by mouth tid 7)  Vicodin 5-500 Mg Tabs (Hydrocodone-acetaminophen ) .... One tab by mouth q4h as needed pain  Patient Instructions: 1)  Good to see you today, 2)  You have a muscle strain, you can take a muscle relaxant, vicodin, use a heating pad, and refrain from the physical activity until your symptoms have resolved. 3)  Come back to see me in 2 weeks. 4)  -Dr. ONEIDA. Prescriptions: VICODIN 5-500 MG TABS (HYDROCODONE-ACETAMINOPHEN ) One tab by mouth q4h as needed pain  #40 x 0   Entered and Authorized by:   Debby Petties MD   Signed by:   Debby Petties MD on 02/08/2009   Method used:   Print then Give to Patient   RxID:   [id number] CYCLOBENZAPRINE  HCL 5 MG TABS (CYCLOBENZAPRINE  HCL) One tab by mouth TID  #60 x 0   Entered and Authorized by:   Debby Petties MD   Signed by:   Debby Petties MD on 02/08/2009   Method used:   Print then Give to Patient   RxID:   [id number]    Medication Administration  Injection # 1:    Medication: Ketorolac -Toradol  15mg     Diagnosis: LOW BACK PAIN, ACUTE (ICD-724.2)    Route: IM    Site: L deltoid    Exp Date: 10/10/2010    Lot #: [id number]    Mfr: hospira    Comments: pt given 30 mg of toradol     Patient tolerated injection without complications    Given by: Letitia Reusing (February 08, 2009 12:22 PM)  Orders Added: 1)  [facility]- Est Level  3 [99213] 2)  Ketorolac -Toradol  15mg  [G8114]

## 2010-07-11 NOTE — Progress Notes (Signed)
 Summary: triage  Phone Note Call from Patient Call back at Home Phone [phone]   Caller: Patient Summary of Call: blowing green mucus and wants something called into [facility] Very congested Initial call taken by: Karna Seminole,  December 16, 2008 8:37 AM  Follow-up for Phone Call        left message Follow-up by: Ginnie Mau RN,  December 16, 2008 8:54 AM  Additional Follow-up for Phone Call Additional follow up Details #1::        started saturday. taking mucinex , tylenol . wants med called in to avoid copay for visit. told her M[facility] will not rx over the phone. told her to continue what she is taking. may add saline or Afrin nasal spray. use humidifier at night if she has one. sip on water & hard candy to help with dry mouth. most illnsses like this pass within a week. call back for appt if fever, face pain or symptoms are still present over a week. she agreed with plan Additional Follow-up by: Ginnie Mau RN,  December 16, 2008 8:59 AM    Additional Follow-up for Phone Call Additional follow up Details #2::    Excellent.  Also tell her to make sure she is taking the extra strength mucinex  1200mg  (OTC) form two times a day, not Mucinex -D.  Drink lots of fluids.  Thanks. Follow-up by: Debby Petties MD,  December 16, 2008 10:12 AM    Appended Document: triage pt had 600mg  tabs. instructed her to take 2 tabs two times a day. she verbalized understanding

## 2010-07-13 NOTE — Assessment & Plan Note (Signed)
Summary: f/u eo   Vital Signs:  Patient profile:   55 year old female Height:      67 inches Weight:      232 pounds BMI:     36.47 Temp:     98.0 degrees F oral Pulse rate:   88 / minute BP sitting:   118 / 70  (left arm) Cuff size:   large  Vitals Entered By: Jimmy Footman, CMA (July 07, 2010 1:59 PM) CC: office visit Is Patient Diabetic? Yes Did you bring your meter with you today? No   Primary Care Provider:  Rodney Langton MD  CC:  office visit.  History of Present Illness: 55 yo female here for fu.  HTN:  Well controlled on recheck.  No refills needed.  HLD:  Due for FLP in 1 month, taking Niacin and Pravastatin.  DM2: Taking metformin, still having a little diarrhea helped with loperamide.  Due for A1c.  Depression:  Has been depressed for a while now, was taking Celexa but inadvertantly stopped taking this without consulting me.  Found out husband has been cheating on her and would like to leave him.  Habits & Providers  Alcohol-Tobacco-Diet     Tobacco Status: never  Current Medications (verified): 1)  Lisinopril-Hydrochlorothiazide 20-12.5 Mg  Tabs (Lisinopril-Hydrochlorothiazide) .... Take One Tablet Daily For Blood Pressure 2)  Pravastatin Sodium 20 Mg Tabs (Pravastatin Sodium) .... One Tab By Mouth Qhs 3)  Celexa 20 Mg Tabs (Citalopram Hydrobromide) .... One Tab By Mouth Daily For Depression. 4)  Ferrous Sulfate 325 (65 Fe) Mg Tbec (Ferrous Sulfate) .... One Tab By Mouth Tid 5)  Colace 100 Mg Caps (Docusate Sodium) .... One By Mouth Two Times A Day As Needed For Constipation. 6)  Hydroxyzine Hcl 25 Mg Tabs (Hydroxyzine Hcl) .... One Tab By Mouth Qhs 7)  Metformin Hcl 500 Mg Tabs (Metformin Hcl) .... One Tab By Mouth Two Times A Day 8)  Aspirin 81 Mg Tbec (Aspirin) .... One Tab By Mouth Daily 9)  Prilosec 40 Mg Cpdr (Omeprazole) .... One Tab By Mouth in The Morning and Then in The Early Evening.  Allergies (verified): 1)  Codeine Phosphate (Codeine  Phosphate)  Social History: Smoking Status:  never  Review of Systems       See HPI  Physical Exam  General:  Well-developed,well-nourished,in no acute distress; alert,appropriate and cooperative throughout examination Lungs:  Normal respiratory effort, chest expands symmetrically. Lungs are clear to auscultation, no crackles or wheezes. Heart:  Normal rate and regular rhythm. S1 and S2 normal without gallop, murmur, click, rub or other extra sounds. Abdomen:  Bowel sounds positive,abdomen soft and non-tender without masses, organomegaly or hernias noted. Psych:  Cognition and judgment appear intact. Alert and cooperative with normal attention span and concentration. No apparent delusions, illusions, hallucinations, depressed affect.   Additional Exam:  PHQ9:  01/25/10: 14 07/07/10: 19   Impression & Recommendations:  Problem # 1:  DEPRESSION, MAJOR (ICD-296.20) Assessment Deteriorated Had pt consult with PhD Psychology student. Restarting Celexa. Would like to see her again in a month to reassess. No SI/HI  Orders: [facility]- Est  Level 4 (16109)  Problem # 2:  HYPERLIPIDEMIA (ICD-272.4) Assessment: Unchanged FLP in one month.  Her updated medication list for this problem includes:    Pravastatin Sodium 20 Mg Tabs (Pravastatin sodium) ..... One tab by mouth qhs  Orders: [facility]- Est  Level 4 (99214)Future Orders: Lipid-[facility] ([id number]) ... 07/06/2011  Problem # 3:  HYPERTENSION, BENIGN SYSTEMIC (ICD-401.1)  Assessment: Improved Better on recheck.  Her updated medication list for this problem includes:    Lisinopril-hydrochlorothiazide 20-12.5 Mg Tabs (Lisinopril-hydrochlorothiazide) .Marland Kitchen... Take one tablet daily for blood pressure  Orders: [facility]- Est  Level 4 (99214)  Problem # 4:  DIABETES MELLITUS II, UNCOMPLICATED (ICD-250.00) Assessment: Improved On a good medical regimen. Not ready to stat exercising. Treating her depression may give her enough energy to  exercise. A1c:7, no changes needed.  Her updated medication list for this problem includes:    Lisinopril-hydrochlorothiazide 20-12.5 Mg Tabs (Lisinopril-hydrochlorothiazide) .Marland Kitchen... Take one tablet daily for blood pressure    Metformin Hcl 500 Mg Tabs (Metformin hcl) ..... One tab by mouth two times a day    Aspirin 81 Mg Tbec (Aspirin) ..... One tab by mouth daily  Orders: A1C-[facility] (53664) [facility]- Est  Level 4 (40347)  Complete Medication List: 1)  Lisinopril-hydrochlorothiazide 20-12.5 Mg Tabs (Lisinopril-hydrochlorothiazide) .... Take one tablet daily for blood pressure 2)  Pravastatin Sodium 20 Mg Tabs (Pravastatin sodium) .... One tab by mouth qhs 3)  Celexa 20 Mg Tabs (Citalopram hydrobromide) .... One tab by mouth daily for depression. 4)  Ferrous Sulfate 325 (65 Fe) Mg Tbec (Ferrous sulfate) .... One tab by mouth tid 5)  Colace 100 Mg Caps (Docusate sodium) .... One by mouth two times a day as needed for constipation. 6)  Hydroxyzine Hcl 25 Mg Tabs (Hydroxyzine hcl) .... One tab by mouth qhs 7)  Metformin Hcl 500 Mg Tabs (Metformin hcl) .... One tab by mouth two times a day 8)  Aspirin 81 Mg Tbec (Aspirin) .... One tab by mouth daily 9)  Prilosec 40 Mg Cpdr (Omeprazole) .... One tab by mouth in the morning and then in the early evening.  Patient Instructions: 1)  Great to see you, 2)  Take your meds as rx'ed. 3)  Checking A1c today. 4)  Make appt at the front for cholesterol (fasting) in a month. 5)  Then make appt to see me after that. 6)  Refilled your depression medication, take this every day. 7)  You are doing well! 8)  -Dr. Karie Schwalbe. Prescriptions: CELEXA 20 MG TABS (CITALOPRAM HYDROBROMIDE) One tab by mouth daily for depression.  #90 x 0   Entered and Authorized by:   Rodney Langton MD   Signed by:   Rodney Langton MD on 07/07/2010   Method used:   Electronically to        [facility] (retail)       [address]       [place], [place]  [address]       Ph: [phone]       Fax: [phone]   RxID:   [id number]    Orders Added: 1)  A1C-[facility] [83036] 2)  Lipid-[facility] [80061-22930] 3)  [facility]- Est  Level 4 [22025]

## 2010-07-19 NOTE — Assessment & Plan Note (Signed)
Summary: Behavioral Medicine Student Consultation   Primary Care Provider:  Rodney Langton MD   History of Present Illness: Brandi Davenport reported experiencing symptoms of depression, specifically sadness, insomnia, and a tendency to isolate from others over the past two months. Brandi Davenport reported that she only slept a few hours per night and typically watched television throughout the night when she could not sleep. Brandi Davenport indicated that her husband recently had an affair and that she had planned to file for divorce within the next two months. She noted that she was currently living with her husband, and had limited communication with him. She reported that her mother was a source of support and noted that she had told her about the affair. Additionally, Brandi Davenport reported that she was the primary caretaker for her 53-year-old grandson (her son's child) and reported that her grandson's mother was disrespectful toward her. Brandi Davenport noted that she was limiting contact with her grandson's mother. 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 Brandi Davenport indicated that she felt that her depressive symptoms were primarily related to her marital difficulties.      Allergies: 1)  Codeine Phosphate (Codeine Phosphate)   Impression & Recommendations:  Problem # 1:  DEPRESSION, MAJOR (ICD-296.20) Brandi Davenport presented with flat affect and her thought contect was clear and goal-directed. She brightened in response to humor. We reviewed behavioral strategies (including exercise) that could address her symptoms of depression and interventions to improve sleep hygiene. We also discussed the possibility of additional individual therapy. I also validated Mrs. Shaw's sadness in relation to her marital problems. I explained the rationale for engaging in pleasant events to alleviate depressive symptoms,  and we made a list of activities that Brandi Davenport could complete on a regular basis, such as talking to friends and family, going to the park with her grandson, and going to [employer]. I underscored the notion of soliciting support from friends and family. Brandi Davenport was receptive to this idea, stating that she was meeting with a female friend later today and intended to tell her about her marital problems. Brandi Davenport was open to the idea of exercise (specifically going for a walk) in the future once the weather improved. We discussed the idea of walking indoors, but Brandi Davenport indicated that this was difficult to do with her grandson. She appeared to like the idea of walking with a friend, but noted that they were busy like her. We also reviewed sleep hygiene techniques and I provided her with a handout. She was receptive to the idea of only using one's bedroom for sleep, and engaging in quiet (rather than stimulating) activities if she could not sleep.  Brandi Davenport also indicated that she wanted to work with an individual therapist to address her symptoms of depression and I provided her with a list of referrals for the [place] area.   Recommended Follow-Up: - Check in with Brandi Davenport regarding her depressive symptoms, including insomnia, and whether she accessed individual therapy services.   Michel Bickers  Behavioral Medicine Student  July 07, 2010 4:00 PM   Complete Medication List: 1)  Lisinopril-hydrochlorothiazide 20-12.5 Mg Tabs (Lisinopril-hydrochlorothiazide) .... Take one tablet daily for blood pressure 2)  Pravastatin Sodium 20 Mg Tabs (Pravastatin sodium) .... One tab by mouth qhs 3)  Celexa 20 Mg Tabs (Citalopram hydrobromide) .... One tab by mouth daily for depression. 4)  Ferrous Sulfate 325 (65 Fe) Mg Tbec (Ferrous sulfate) .... One tab by mouth tid 5)  Colace 100 Mg Caps (Docusate sodium) .... One by mouth two times a day as needed for constipation. 6)  Hydroxyzine Hcl 25 Mg Tabs  (Hydroxyzine hcl) .... One tab by mouth qhs 7)  Metformin Hcl 500 Mg Tabs (Metformin hcl) .... One tab by mouth two times a day 8)  Aspirin 81 Mg Tbec (Aspirin) .... One tab by mouth daily 9)  Prilosec 40 Mg Cpdr (Omeprazole) .... One tab by mouth in the morning and then in the early evening.     Laboratory Results   Blood Tests   Date/Time Received: July 07, 2010 2:24 PM  Date/Time Reported: July 07, 2010 3:29 PM   HGBA1C: 7.0%   (Normal Range: Non-Diabetic - 3-6%   Control Diabetic - 6-8%)  Comments: ...............test performed by......Marland KitchenBonnie A. [place], MLS (ASCP)cm     Appended Document: Behavioral Medicine Student Consultation Reviewed note and agree with plan.

## 2010-07-21 ENCOUNTER — Other Ambulatory Visit: Payer: Self-pay | Admitting: Obstetrics and Gynecology

## 2010-07-21 ENCOUNTER — Other Ambulatory Visit: Payer: Self-pay | Admitting: Sports Medicine

## 2010-08-07 ENCOUNTER — Other Ambulatory Visit: Payer: Self-pay

## 2010-08-07 DIAGNOSIS — E785 Hyperlipidemia, unspecified: Secondary | ICD-10-CM

## 2010-08-07 LAB — CONVERTED CEMR LAB
Cholesterol: 136 mg/dL (ref 0–200)
HDL: 52 mg/dL (ref >39)
Total CHOL/HDL Ratio: 2.6

## 2010-08-07 LAB — LIPID PANEL
LDL Cholesterol: 75 mg/dL (ref 0–99)
Triglycerides: 47 mg/dL (ref <150)

## 2010-08-08 ENCOUNTER — Encounter: Payer: Self-pay | Admitting: Sports Medicine

## 2010-08-18 ENCOUNTER — Encounter: Payer: Self-pay | Admitting: Sports Medicine

## 2010-08-18 ENCOUNTER — Ambulatory Visit (INDEPENDENT_AMBULATORY_CARE_PROVIDER_SITE_OTHER): Payer: Self-pay | Admitting: Sports Medicine

## 2010-08-18 VITALS — BP 135/86 | HR 72 | Temp 98.1°F | Ht 67.0 in | Wt 232.1 lb

## 2010-08-18 DIAGNOSIS — I10 Essential (primary) hypertension: Secondary | ICD-10-CM

## 2010-08-18 DIAGNOSIS — G44209 Tension-type headache, unspecified, not intractable: Secondary | ICD-10-CM

## 2010-08-18 DIAGNOSIS — E785 Hyperlipidemia, unspecified: Secondary | ICD-10-CM

## 2010-08-18 MED ORDER — CYCLOBENZAPRINE HCL 5 MG PO TABS: ORAL_TABLET | ORAL | Status: DC

## 2010-08-18 MED ORDER — ACETAMINOPHEN ER 650 MG PO TBCR: 1300.0000 mg | EXTENDED_RELEASE_TABLET | Freq: Two times a day (BID) | ORAL | Status: DC

## 2010-08-18 NOTE — Assessment & Plan Note (Signed)
Pt will RTC to discuss this. 

## 2010-08-18 NOTE — Assessment & Plan Note (Signed)
Acetaminophen 650 two tabs BID. Flexeril qHS Handout given. RTC to discuss chronic issues.

## 2010-08-18 NOTE — Assessment & Plan Note (Signed)
Pt will RTC to discuss this.

## 2010-08-18 NOTE — Progress Notes (Addendum)
  Subjective:    Patient ID: Brandi Davenport, female    DOB: 1955/12/16, 55 y.o.   MRN: [id number]  HPI Pt initially here to discuss labwork and chronic issues however she would rather discuss her HA.  HEADACHE  Onset: 2 wks Location: Bandlike around side of head/neck. Description: Tight, constant, lasts all day, no N/V, no fevers/chills Modifying factors: Not better with aleve. Husband cheating on her, trying to make it work, lots of stress.  Symptoms Nausea/vomiting: NO Photophobia: NO Phonophobia: NO Tearing of eyes: NO Sinus pain/pressure: NO Relation to menstrual cycle: NO  Red Flags Fever: NO Neck pain/stiffness: NO Vision/speech difficulty: NO Focal weakness or numbness: NO Altered mental status: NO Trauma: NO Worse in am: NO Anticoagulant use: NO Immunocompromise: NO  Family history of Migraine: NO   Review of Systems    See HPI Objective:   Physical Exam  Constitutional: She appears well-developed and well-nourished. No distress.  HENT:  Head: Normocephalic.  Mouth/Throat: Oropharynx is clear and moist. No oropharyngeal exudate.  Eyes: Conjunctivae and EOM are normal. Pupils are equal, round, and reactive to light. Right eye exhibits no discharge. Left eye exhibits no discharge.  Fundoscopic exam:      The right eye shows no arteriolar narrowing, no AV nicking, no exudate, no hemorrhage and no papilledema. The right eye shows red reflex.      The left eye shows no arteriolar narrowing, no AV nicking, no exudate, no hemorrhage and no papilledema. The left eye shows red reflex. Neck: Normal range of motion.  Cardiovascular: Normal rate, regular rhythm and normal heart sounds.  Exam reveals no gallop and no friction rub.   No murmur heard. Pulmonary/Chest: Effort normal and breath sounds normal. No respiratory distress. She has no wheezes. She has no rales. She exhibits no tenderness.  Neurological: She displays normal reflexes. No cranial nerve deficit. She  exhibits normal muscle tone. Coordination normal.       Sensory, motor, coordinative functions intact grossly.          Assessment & Plan:

## 2010-08-18 NOTE — Patient Instructions (Addendum)
Tension Headache (Muscle Contraction Headache) Tension headache is one of the most common causes of head pain. These headaches are usually felt as a pain over the top of your head and back of your neck. Stress, anxiety, and depression are common triggers for these headaches. Tension headaches are not life-threatening and will not lead to other types of headaches. Tension headaches can often be diagnosed by taking a history from the patient and a physical exam. Sometimes, further lab and x-ray studies are used to confirm the diagnosis. Your caregiver can advise you on how to get help solving problems that cause anxiety or stress. Antidepressants can be prescribed if depression is a problem. HOME CARE INSTRUCTIONS  If testing was done, call for your results. Remember, it is your responsibility to get the results of all testing. Do not assume everything is fine because you do not hear from your caregiver.   Only take over-the-counter or prescription medicines for pain, discomfort, or fever as directed by your caregiver.   Biofeedback, massage, or other relaxation techniques may be helpful.   Ice packs or heat to the head and neck can be used. Use these three to four times per day or as needed.   Physical therapy may be a useful addition to treatment.   If headaches continue, even with therapy, you may need to think about lifestyle changes.   Avoid excessive use of pain killers, as rebound headaches can occur.  1.  SEEK MEDICAL CARE IF:  You develop problems with medications prescribed.   You do not respond or get no relief from medications.   You have a change from the usual headache.   You develop nausea (feeling sick to your stomach) or vomiting.  SEEK IMMEDIATE MEDICAL CARE IF:    Your headache becomes severe.   You have an unexplained oral temperature above 100.4.   You develop a stiff neck.   You have loss of vision.   You have muscular weakness.   You have loss of muscular  control.   You develop severe symptoms different from your first symptoms.   You start losing your balance or have trouble walking.   You feel faint or pass out.  MAKE SURE YOU:    Understand these instructions.   Will watch your condition.   Will get help right away if you are not doing well or get worse.  Document Released: 05/28/2005 Document Re-Released: 08/24/2008 [facility] Patient Information 2011 [place], [place].Tension Headache (Muscle Contraction Headache) Tension headache is one of the most common causes of head pain. These headaches are usually felt as a pain over the top of your head and back of your neck. Stress, anxiety, and depression are common triggers for these headaches. Tension headaches are not life-threatening and will not lead to other types of headaches. Tension headaches can often be diagnosed by taking a history from the patient and a physical exam. Sometimes, further lab and x-ray studies are used to confirm the diagnosis. Your caregiver can advise you on how to get help solving problems that cause anxiety or stress. Antidepressants can be prescribed if depression is a problem. HOME CARE INSTRUCTIONS  If testing was done, call for your results. Remember, it is your responsibility to get the results of all testing. Do not assume everything is fine because you do not hear from your caregiver.   Only take over-the-counter or prescription medicines for pain, discomfort, or fever as directed by your caregiver.   Biofeedback, massage, or other relaxation techniques  may be helpful.   Ice packs or heat to the head and neck can be used. Use these three to four times per day or as needed.   Physical therapy may be a useful addition to treatment.   If headaches continue, even with therapy, you may need to think about lifestyle changes.   Avoid excessive use of pain killers, as rebound headaches can occur.  2.  SEEK MEDICAL CARE IF:  You develop problems with  medications prescribed.   You do not respond or get no relief from medications.   You have a change from the usual headache.   You develop nausea (feeling sick to your stomach) or vomiting.  SEEK IMMEDIATE MEDICAL CARE IF:    Your headache becomes severe.   You have an unexplained oral temperature above 100.4.   You develop a stiff neck.   You have loss of vision.   You have muscular weakness.   You have loss of muscular control.   You develop severe symptoms different from your first symptoms.   You start losing your balance or have trouble walking.   You feel faint or pass out.  MAKE SURE YOU:    Understand these instructions.   Will watch your condition.   Will get help right away if you are not doing well or get worse.  Document Released: 05/28/2005 Document Re-Released: 08/24/2008 [facility] Patient Information 2011 [place], [place].  -Dr. Karie Schwalbe.

## 2010-08-26 ENCOUNTER — Inpatient Hospital Stay (INDEPENDENT_AMBULATORY_CARE_PROVIDER_SITE_OTHER)
Admission: RE | Admit: 2010-08-26 | Discharge: 2010-08-26 | Disposition: A | Discharge status: Home / Self Care | Payer: Self-pay | Source: Ambulatory Visit | Attending: Emergency Medicine | Admitting: Emergency Medicine

## 2010-08-26 DIAGNOSIS — S7000XA Contusion of unspecified hip, initial encounter: Secondary | ICD-10-CM

## 2010-08-26 DIAGNOSIS — S40019A Contusion of unspecified shoulder, initial encounter: Secondary | ICD-10-CM

## 2010-08-28 ENCOUNTER — Inpatient Hospital Stay (HOSPITAL_COMMUNITY)
Admission: RE | Admit: 2010-08-28 | Discharge: 2010-08-28 | Disposition: A | Discharge status: Home / Self Care | Payer: Self-pay | Source: Ambulatory Visit

## 2010-09-08 ENCOUNTER — Ambulatory Visit (INDEPENDENT_AMBULATORY_CARE_PROVIDER_SITE_OTHER): Payer: Self-pay | Admitting: Sports Medicine

## 2010-09-08 ENCOUNTER — Encounter: Payer: Self-pay | Admitting: Sports Medicine

## 2010-09-08 DIAGNOSIS — I10 Essential (primary) hypertension: Secondary | ICD-10-CM

## 2010-09-08 DIAGNOSIS — E119 Type 2 diabetes mellitus without complications: Secondary | ICD-10-CM

## 2010-09-08 DIAGNOSIS — Z0000 Encounter for general adult medical examination without abnormal findings: Secondary | ICD-10-CM | POA: Insufficient documentation

## 2010-09-08 DIAGNOSIS — F329 Major depressive disorder, single episode, unspecified: Secondary | ICD-10-CM

## 2010-09-08 DIAGNOSIS — Z299 Encounter for prophylactic measures, unspecified: Secondary | ICD-10-CM

## 2010-09-08 DIAGNOSIS — L219 Seborrheic dermatitis, unspecified: Secondary | ICD-10-CM | POA: Insufficient documentation

## 2010-09-08 DIAGNOSIS — E785 Hyperlipidemia, unspecified: Secondary | ICD-10-CM

## 2010-09-08 DIAGNOSIS — G44209 Tension-type headache, unspecified, not intractable: Secondary | ICD-10-CM

## 2010-09-08 MED ORDER — NEOMYCIN-POLYMYXIN-HC 3.5-10000-1 OT SOLN: 3.0000 [drp] | Freq: Three times a day (TID) | OTIC | Status: AC

## 2010-09-08 MED ORDER — LISINOPRIL-HYDROCHLOROTHIAZIDE 20-12.5 MG PO TABS: 1.0000 | ORAL_TABLET | Freq: Every day | ORAL | Status: DC

## 2010-09-08 MED ORDER — METFORMIN HCL 1000 MG PO TABS: 1000.0000 mg | ORAL_TABLET | Freq: Two times a day (BID) | ORAL | Status: DC

## 2010-09-08 NOTE — Patient Instructions (Signed)
Great to see you, Refilled BP medicine. Start the new ear drops for your itch. Get your mammogram. Setting up colonoscopy. Come back to see me in 3 months.  Brandi Davenport. Benjamin Stain, M.D.

## 2010-09-08 NOTE — Assessment & Plan Note (Signed)
Under good control, LDL <100. No changes.

## 2010-09-08 NOTE — Assessment & Plan Note (Signed)
Under excellent control. No changes needed. Creat/K up to date.

## 2010-09-08 NOTE — Progress Notes (Signed)
  Subjective:    Patient ID: Brandi Davenport, female    DOB: 05/14/1956, 55 y.o.   MRN: [id number]  HPI HA:  Dx TTH last visit, Rx acetaminophen 1,300 BID.  Symptoms resolved.  HTN:  Well controlled.  HLD:  Well controlled.  DM2:  Last A1c 7%, would like to get to 6.5%.  No adverse effects from the metformin.  Preventive medicine:  Due for mammogram, colonoscopy.  Ear itch:  Feels like for EAMs itch, occasionally notes flaking from ears.   Review of Systems    See HPI Objective:   Physical Exam  Constitutional: She appears well-developed and well-nourished. No distress.  HENT:  Head: Normocephalic.  Right Ear: External ear normal.  Left Ear: External ear normal.  Mouth/Throat: Oropharynx is clear and moist. No oropharyngeal exudate.  Eyes: Pupils are equal, round, and reactive to light. Right eye exhibits no discharge. Left eye exhibits no discharge.  Neck: Normal range of motion. No JVD present. No tracheal deviation present. No thyromegaly present.  Cardiovascular: Normal rate, regular rhythm and normal heart sounds.  Exam reveals no gallop and no friction rub.   No murmur heard. Pulmonary/Chest: Effort normal. No respiratory distress. She has no wheezes. She has no rales. She exhibits no tenderness.  Skin: Skin is warm and dry.          Assessment & Plan:

## 2010-09-08 NOTE — Assessment & Plan Note (Signed)
Mammo ordered. S/p hysterectomy, no need for PAPs. Colonoscopy ordered.

## 2010-09-08 NOTE — Assessment & Plan Note (Signed)
Only otic steroid at [facility] is cortisporin. Will use for 10d

## 2010-09-08 NOTE — Assessment & Plan Note (Signed)
Mood is excellent today. Pt feels celexa working very well. Still dealing with her husbands infidelity however he was just dx prostate ca and she is going to stick with him through this. No changes.

## 2010-09-08 NOTE — Assessment & Plan Note (Signed)
Under good control. Goal is excellent control, A1c <6.5. Increasing metformin to 1000 BID. RTC 3 months to recheck.

## 2010-09-08 NOTE — Assessment & Plan Note (Signed)
Symptoms well controlled with acet 1300 BID

## 2010-09-11 ENCOUNTER — Encounter: Payer: Self-pay | Admitting: Sports Medicine

## 2010-09-15 ENCOUNTER — Other Ambulatory Visit: Payer: Self-pay | Admitting: Sports Medicine

## 2010-09-15 DIAGNOSIS — Z1231 Encounter for screening mammogram for malignant neoplasm of breast: Secondary | ICD-10-CM

## 2010-09-15 LAB — CARDIAC PANEL(CRET KIN+CKTOT+MB+TROPI)
CK, MB: 0.9 ng/mL (ref 0.3–4.0)
Troponin I: 0.01 ng/mL (ref 0.00–0.06)

## 2010-09-15 LAB — CBC
HCT: 30.5 % — ABNORMAL LOW (ref 36.0–46.0)
HCT: 32.7 % — ABNORMAL LOW (ref 36.0–46.0)
Hemoglobin: 10.3 g/dL — ABNORMAL LOW (ref 12.0–15.0)
Hemoglobin: 11.1 g/dL — ABNORMAL LOW (ref 12.0–15.0)
MCHC: 33.9 g/dL (ref 30.0–36.0)
MCV: 83 fL (ref 78.0–100.0)
RBC: 3.67 MIL/uL — ABNORMAL LOW (ref 3.87–5.11)
RBC: 3.94 MIL/uL (ref 3.87–5.11)
RDW: 13.2 % (ref 11.5–15.5)
WBC: 4.3 10*3/uL (ref 4.0–10.5)

## 2010-09-15 LAB — DIFFERENTIAL
Eosinophils Absolute: 0.1 10*3/uL (ref 0.0–0.7)
Eosinophils Relative: 2 % (ref 0–5)
Lymphs Abs: 1.5 10*3/uL (ref 0.7–4.0)
Monocytes Absolute: 0.4 10*3/uL (ref 0.1–1.0)
Monocytes Relative: 8 % (ref 3–12)

## 2010-09-15 LAB — BASIC METABOLIC PANEL
BUN: 10 mg/dL (ref 6–23)
BUN: 13 mg/dL (ref 6–23)
CO2: 30 mEq/L (ref 19–32)
CO2: 30 mEq/L (ref 19–32)
Calcium: 8.4 mg/dL (ref 8.4–10.5)
Calcium: 9.1 mg/dL (ref 8.4–10.5)
Creatinine, Ser: 0.74 mg/dL (ref 0.4–1.2)
GFR calc Af Amer: >60 mL/min (ref >60)
GFR calc non Af Amer: >60 mL/min (ref >60)
Glucose, Bld: 130 mg/dL — ABNORMAL HIGH (ref 70–99)

## 2010-09-15 LAB — CK TOTAL AND CKMB (NOT AT ARMC)
CK, MB: 0.9 ng/mL (ref 0.3–4.0)
Relative Index: INVALID (ref 0.0–2.5)

## 2010-09-15 LAB — LIPID PANEL
Cholesterol: 178 mg/dL (ref 0–200)
HDL: 47 mg/dL (ref >39)
Total CHOL/HDL Ratio: 3.8 RATIO
Triglycerides: 78 mg/dL (ref <150)

## 2010-09-15 LAB — POCT CARDIAC MARKERS: Troponin i, poc: <0.05 ng/mL (ref 0.00–0.09)

## 2010-09-15 LAB — TROPONIN I: Troponin I: 0.02 ng/mL (ref 0.00–0.06)

## 2010-09-26 ENCOUNTER — Ambulatory Visit (HOSPITAL_COMMUNITY)
Admission: RE | Admit: 2010-09-26 | Discharge: 2010-09-26 | Disposition: A | Discharge status: Home / Self Care | Payer: Self-pay | Source: Ambulatory Visit | Attending: Sports Medicine | Admitting: Sports Medicine

## 2010-09-26 DIAGNOSIS — Z1231 Encounter for screening mammogram for malignant neoplasm of breast: Secondary | ICD-10-CM | POA: Insufficient documentation

## 2010-10-19 ENCOUNTER — Ambulatory Visit (INDEPENDENT_AMBULATORY_CARE_PROVIDER_SITE_OTHER): Payer: Self-pay | Admitting: Family Medicine

## 2010-10-19 ENCOUNTER — Encounter: Payer: Self-pay | Admitting: Family Medicine

## 2010-10-19 DIAGNOSIS — R42 Dizziness and giddiness: Secondary | ICD-10-CM

## 2010-10-19 DIAGNOSIS — G44209 Tension-type headache, unspecified, not intractable: Secondary | ICD-10-CM

## 2010-10-19 MED ORDER — TRAMADOL HCL 50 MG PO TABS: 50.0000 mg | ORAL_TABLET | Freq: Four times a day (QID) | ORAL | Status: DC | PRN

## 2010-10-19 NOTE — Patient Instructions (Addendum)
Take the Tramadol 1 pill every 6 hours if needed for headaches.   Your dizziness is probably both from being a little dehydrated (lightheadedness) and dizziness from the Flexeril you're taking. If you are still dizzy after stopping the Flexeril let us know when you come back.

## 2010-10-19 NOTE — Assessment & Plan Note (Signed)
Combination of lightheadedness and vertigo, possibly BPPV. Recommended to stop taking Flexeril as these symptoms did not start until it was initiated. Recommended to increase daily water and fluid intake, excluding sugary or caffeinated drinks.   BP 135/80, less likely orthostasis.   If persists, would entertain diagnosis of BPPV with vestibular therapy and meclizine.

## 2010-10-19 NOTE — Assessment & Plan Note (Signed)
Persists. Stopping Flexeril due to dizzy symptoms.   Starting short course of Tramadol, recommended relaxation techniques, massage.   Feel this are related to husband's recent diagnosis as she denies headaches prior to prostate CA diagnosis. May be candidate for counseling to help deal with stress at home.

## 2010-10-19 NOTE — Progress Notes (Signed)
  Subjective:    Patient ID: Brandi Davenport, female    DOB: 06-28-1955, 55 y.o.   MRN: [id number]  HPI 1.  Dizziness:  Complains both of lightheadedness with standing as well as vertiginous symptoms (room spinning around).  Lightheaded when going from standing to sitting and also lying to sitting.  Vertigo symptoms when she turns her head quickly, this only occurs when she is up walking around and being active.  Only started experiencing vertigo after starting Flexeril.  Drinks few fluids during day, 3-4 8 ounce cups water, not much else.  No rash, fevers, chills.    2.  Headaches:  Persist.  Diagnosed with tension HA previously.  Husband diagnosed with prostate cancer 1 month ago, headaches started at that time.  Flexeril relieves headaches but makes her dizzy as above.  Has tried Alleve, Advil, Tylenol without relief.  Headaches frontotemporal region, dull aching pain, occur daily. No focal deficits, rhinorrhea.     Review of Systems See HPI above for review of systems.       Objective:   Physical Exam Gen:  Alert, cooperative patient who appears stated age in no acute distress.  Vital signs reviewed. HEENT:  [place]/AT.  EOMI, PERRL.  MMM, tonsils non-erythematous, non-edematous.  External ears WNL, Bilateral TM's normal without retraction, redness or bulging.  Neck: No masses or thyromegaly or limitation in range of motion.  No cervical lymphadenopathy. Neuro:  Nystagmus noted to right, 3 beats.  Easily extinguished.  No gait deformities, ataxia.         Assessment & Plan:

## 2010-10-24 NOTE — Discharge Summary (Signed)
Brandi Davenport, Brandi Davenport               ACCOUNT NO.:  [id number]   MEDICAL RECORD NO.:  [id number]          PATIENT TYPE:  OBV   LOCATION:  3731                         FACILITY:  [facility]   PHYSICIAN:  Santiago Bumpers. Hensel, M.D.DATE OF BIRTH:  [birth year]   DATE OF ADMISSION:  03/04/2008  DATE OF DISCHARGE:  03/05/2008                               DISCHARGE SUMMARY   ADMISSION DIAGNOSES:  Chest pain, hypertension, diabetes mellitus,  obesity, gastroesophageal reflux disease, and anemia.   DISCHARGE DIAGNOSES:  Gastroesophageal reflux disease, hypertension,  diabetes mellitus, obesity, and anemia.   CONSULTS DURING ADMISSION:  Cardiology.   PROCEDURES DONE DURING ADMISSION:  The patient had a chest x-ray in the  ED that showed no active disease.  The patient also had an exercise  treadmill Myoview stress test, which was negative for exercise stress-  induced ischemia and left ventricular ejection fraction was 73%.   HISTORY:  Briefly, Brandi Davenport had experienced one-month history  of sharp, nonradiating substernal chest pain brought on by activity,  food, and arm movement.  It was relieved by rest.  She came in today  because she had experienced overnight chest pain and tightness with  vision change that she described as seeing stars.  Of note, she had been  taking 1600 mg of ibuprofen per day for a month now due to her chest  pain.  Although her pain can be caused by food, she denied any sensation  of food becoming stuck in her throat when she swallowed, and also of  note her pain could be caused by exertion.  She also described a two-  pillow orthopnea, paroxysmal nocturnal dyspnea, and subjective ankle  edema.  She also notes excessive daytime sleepiness and difficulty  sleeping at night due to stress.  In the ED, the patient was given  aspirin 325 mg, sublingual nitroglycerin plus a nitro drip for a brief  period of time and 2 mg of morphine and 1000 mg of Tylenol.   BRIEF  HOSPITAL COURSE:  At admission, the patient's CBC was, white blood  cell count of 3.8, hemoglobin 11.9, hematocrit 34.6, platelet count of  311.  Point-of-care cardiac enzymes were negative.  D-dimer was  negative.  CMET showed sodium of 135, potassium of 4.9, chloride 101,  bicarb 27, BUN 14, creatinine 0.78, glucose 125, total bilirubin 2.2,  alkaline phosphatase 47, AST 28, ALT 14, total protein 7.2, albumin 3.8,  calcium of 9.2.  Lipase was 19.  BNP was negative at less than 30.  Cardiac enzymes were negative x3, and thyroid-stimulating hormone was  1.630.  The patient was admitted to the telemetry unit and was watched  overnight.  Heparin was not started, and the patient's EKG showed no  signs of ischemia.  On the morning of March 05, 2008, the patient  was taken down for an exercise treadmill Myoview stress test, the  results of which were negative for myocardial ischemia and showed  preserved and adequate ventricular ejection fraction.  The patient's  chest pain subsided with medical management and she was ambulatory and  eating well at the  time of discharge.   DISCHARGE DISPOSITION:  The patient will be discharged to home.   DISCHARGE CONDITION:  Stable.   Follow up will be with Dr. Benjamin Stain at the [facility], the phone number there is [phone] and the date will be  March 19, 2008, at 2 o'clock p.m.   DISCHARGE MEDICATIONS:  1. Lisinopril/hydrochlorothiazide 20-12.5 mg p.o. daily.  2. Pravachol 180 mg p.o. daily.  3. Zantac 300 mg p.o. daily.   The patient was instructed to stop taking her Zantac 150 and also  instructed not to take her ibuprofen anymore.  If she has pain, she  should use Tylenol, but it should not exceed 4 g per day.  The patient  is not on proton pump inhibitor as they are too expensive and she will  not fill the prescription and take it if prescribed.  She is to follow  up as an outpatient for her likely active peptic  ulcer disease, her  anemia, and possible mood disorder.      Rodney Langton, MD  Electronically Signed      Santiago Bumpers. Leveda Anna, M.D.  Electronically Signed    TT/MEDQ  D:  03/05/2008  T:  03/06/2008  Job:  [id number]

## 2010-10-24 NOTE — H&P (Signed)
Brandi Davenport, Brandi Davenport               ACCOUNT NO.:  [id number]   MEDICAL RECORD NO.:  [id number]          PATIENT TYPE:  OBV   LOCATION:  3731                         FACILITY:  [facility]   PHYSICIAN:  Wayne A. Sheffield Slider, M.D.    DATE OF BIRTH:  07/26/1955   DATE OF ADMISSION:  03/04/2008  DATE OF DISCHARGE:                              HISTORY & PHYSICAL   PRIMARY CARE Cathi Hazan:  Rodney Langton, MD   CHIEF COMPLAINT:  Chest pain.   HISTORY OF PRESENT ILLNESS:  [name] has experienced 61-month  of sharp, nonradiating substernal chest pain brought on by activity,  food, and arm movement.  It is relieved by rest.  She came in today  because she experienced overnight chest pain and tightness with vision  changes that she described as seeing starts.  Of note, she has been  taking 1600 mg of ibuprofen per day for a month now due to her chest  pain.  Although her pain can be caused by food, she denies any sensation  of food becoming stuck in her throat when she swallows and of note, her  pain also can be caused by exertion.  She also describes 2-pillow  orthopnea, paroxysmal nocturnal dyspnea, and subjective ankle edema.  She also notes excessive daytime sleepiness and difficulty sleeping at  night due to stress.   In the emergency department, [name] was given aspirin 325, sublingual  nitroglycerin plus nitro drip for a brief period of time, 2 mg of  morphine and a 1000 mg of Tylenol.   PAST MEDICAL HISTORY:  1. Obesity.  2. Hypertension.  3. GERD/peptic ulcer disease.  4. Hyperlipidemia (last lipids, cholesterol 137, triglycerides 61, HDL      55, and LDL 17).  5. Migraines.  6. Osteoarthritis.  7. Type 2 diabetes (last A1c 6.7).   PAST SURGICAL HISTORY:  Hysterectomy 7 years ago during uterine  fibroids.   SOCIAL HISTORY:  She lives with her husband and son.  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  She  does not use tobacco  currently, but has done in the distant past with  less than 5-pack-year history.  She does not drink alcohol or use any  other drugs including marijuana, cocaine, or heroin.   FAMILY HISTORY:  Very significant for heart disease and cancer with  multiple primary relatives experiencing early death due to cancer and  heart disease.   REVIEW OF SYSTEMS:  Please see history of present illness, otherwise,  significant for fatigue, nausea, and heart palpitations.  Otherwise, 10-  item review of systems is negative.   PHYSICAL EXAMINATION:  VITALS:  Temperature 97.8, pulse 90, respiratory  rate 18, blood pressure 141/94, and oxygen saturation 99% on room air.  GENERAL:  A well-nourished, well-groomed woman in no apparent distress.  HEENT:  Sclerae are anicteric.  Conjunctivae are noninjected.  Extraocular movements are intact, and pupils are equal, round, and  reactive to light.  Mucous membranes are moist.  NECK:  Neck appears full; however, no palpable  thyromegaly or nodules.  No bruits or lymphadenopathy were noted.  CARDIOVASCULAR:  Heart sounds are distant, but a 1/6 nonradiating  systolic murmur was noted.  Otherwise, she was regular rate and rhythm  with no other murmurs, rubs, or gallops noted.  She did have  reproducible pain on palpation over her right sternal border.  This pain  is the same pain she has been experiencing over the last month.  LUNGS:  Clear to auscultation bilaterally.  ABDOMEN:  Obese, soft, and nontender with normoactive bowel sounds.  No  guarding or rebound.  RECTAL:  Deferred.  EXTREMITIES:  Trace pitting lower extremity edema.  Pulses are 2+ in all  4 extremities.  DIABETIC FOOT:  Normal appearing and she had normal monofilament testing  x6 locations.  NEUROLOGIC:  Cranial nerves II through XII are within normal limits and  nonfocal.  No gross abnormalities were noted.  MUSCULOSKELETAL:  Sternal tenderness noted on CV exam; otherwise, no  gross  abnormalities noted.   LABORATORY DATA:  CMET was within normal limits with the exception of a  glucose of 125.  BUN was 3.8, total protein 7.2, and total bilirubin was  2.2.  CBC was significant for a white blood cell count of 3.8, H and H  11.9/39.6, and platelets of 311.  D-dimer negative, point-of-care  cardiac enzymes negative.   STUDIES:  EKG unchanged from prior studies with no ST-segment changes,  normal rate and rhythm.   ASSESSMENT AND PLAN:  This is a 55 year old female with past medical  history significant for hypertension, diabetes, obesity, and peptic  ulcer disease/GERD who presents with chest pain.  1. Chest pain.  Her pain has elements that are both typical and      atypical for angina.  Her TIMI score is a 2 which indicates an 8%      risk over the next 14 days for mortality or a new MI.  Thus, we are      concerned that she could have a cardiac etiology to her chest pain;      however, we are not overtly concerned enough to initiate heparin      anticoagulation and pursue an emergent cardiac catheterization.      Our plan includes admitting her overnight on telemetry and      observing a 12-lead EKG in the morning, cycle enzymes x3, and order      a cardiac consult.  Her history is worrisome for heart failure due      to her orthopnea and paroxysmal nocturnal dyspnea.  Thus, we will      pursue 2-D echocardiogram and a BMP.  We will also appreciate cards      recommendations on both of these problems.  She will likely need a      outpatient versus inpatient stress test for risk stratification to      determine the cards recommendations.  2. GERD/peptic ulcer disease.  This could also be the source of her      chest pain.  Her past history indicates that she has a history of      H. pylori seropositivity that was not completely treated due to      tetracycline intolerance.  She has had trouble in the past      affording a PPI, thus she certainly could be having an  active ulcer      versus gastritis.  Our plan includes ordering a Hemoccult test and  providing her with a proton pump inhibitor.  She will likely need      outpatient followup for her GI disease.  Also additionally, she is      55 years old, she is due for colon cancer screening as an      outpatient upon discharge.  3. Diabetes.  We congratulated her on controlling her diabetes very      well with her diet.  Her A1c is less than 7.  In the hospital, we      will provide her with diabetic diet, sliding-scale insulin, and we      will follow.  4. Thyroid.  We suspect she might have thyroid dysfunction due to her      history of fatigue and possible depression.  We will order a      thyroid stimulating hormone and lab and follow up results.  5. Anemia.  In the past, she has had hemoglobin around 11 and has had      a normal ferritin.  We suspect her anemia may be due to iron      deficiency, GI loss, thyroid dysfunction, thalassemia, or anemia of      chronic disease/inflation.  As noted above, we will follow up      Hemoccult and consider further workup if needed.  6. Hypertension.  We also congratulated her on doing a great job of      controlling her hypertension with her      lisinopril/hydrochlorothiazide.  Overnight, we plan on holding her      lisinopril due to the possibility of her receiving a cardiac      catheterization.  We will continue hydrochlorothiazide and restart      lisinopril upon discharge.  7. Hyperlipidemia.  She is doing a great job of controlling her lipids      with Pravachol.  We will continue current therapy.  8. Prophylaxis.  [name] will be on a PPI for ulcer prophylaxis, and      we will initiate 3-times-a-day subcutaneous heparin therapy for DVT      prophylaxis.  9. Dispo.  We admitted her on observation status.  We plan on      discharge tomorrow pending normal test results.      Levander Campion, M.D.  Electronically Signed      Wayne A.  Sheffield Slider, M.D.  Electronically Signed    JH/MEDQ  D:  03/04/2008  T:  03/05/2008  Job:  [id number]

## 2010-10-24 NOTE — Consult Note (Signed)
[name], Davenport               ACCOUNT NO.:  [id number]   MEDICAL RECORD NO.:  [id number]          PATIENT TYPE:  OBV   LOCATION:  3731                         FACILITY:  [facility]   PHYSICIAN:  Verne Carrow, MDDATE OF BIRTH:  1956-03-24   DATE OF CONSULTATION:  03/04/2008  DATE OF DISCHARGE:                                 CONSULTATION   REASON FOR CONSULTATION:  Chest pain.   HISTORY OF PRESENT ILLNESS:  [name] is a very pleasant 55 year old  African American female with multiple risk factors for coronary artery  disease including hyperlipidemia, hypertension, borderline diabetes  mellitus, obesity, and a family history of coronary artery disease who  was admitted to the hospital with complaints of substernal chest pains  that have mostly been occurring after meals, but have also been  occurring at times while she is at work.  She suspected this was related  to acid reflux and was seen by a physician last week who started her on  Zantac and Maalox.  She has had no resolution of her symptoms with this  treatment.  She has been taking large doses of ibuprofen for her chest  pain without relief.  Currently, she is denying any chest pain.  She  states that when the chest pain occurs at home, there is some associated  diaphoresis, but no associated shortness of breath; radiation of the  pain into her jaw, neck, or arm; nausea or vomiting.  She also denies  any dizziness, near syncope, syncope, lower extremity edema, or  palpitations.   PAST MEDICAL HISTORY:  1. Hypertension.  2. Hyperlipidemia.  3. Borderline diabetes mellitus.  4. Obesity.  5. Gastroesophageal reflux disease.  6. Osteoarthritis.   PAST SURGICAL HISTORY:  Hysterectomy.   ALLERGIES:  CODEINE.   CURRENT MEDICATIONS:  1. Aspirin 325 mg once daily.  2. Hydrochlorothiazide 25 mg once daily.  3. Lopressor 12.5 mg twice daily.  4. Protonix 40 mg once daily.  5. Zocor 40 mg once daily.   SOCIAL HISTORY:   The patient is married and denies the use of tobacco,  alcohol or illicit drugs.   FAMILY HISTORY:  The patient has a strong family history of coronary  artery disease with her father dying from a myocardial infarction.   REVIEW OF SYSTEMS:  As stated in the history of present illness, is  otherwise negative.   PHYSICAL EXAMINATION:  GENERAL:  She is a pleasant, well-developed, well-  nourished African American female in no acute distress.  VITAL SIGNS:  Temperature 97.9, pulse is 75 and regular, respirations 12  and unlabored, and blood pressure 104/62.  NECK:  No JVD.  No carotid bruits.  No thyromegaly.  No lymphadenopathy.  SKIN:  Warm and dry.  Oropharynx is clear.  Mucous membranes moist.  LUNGS:  Clear to auscultation bilaterally with no wheezes, rhonchi or  crackles noted.  CARDIOVASCULAR:  Regular rate and rhythm without murmurs, gallops, or  rubs noted.  ABDOMEN:  Benign.  Bowel sounds present.  EXTREMITIES:  No evidence of edema.  Pulses are 2+ in all extremities.   DIAGNOSTIC STUDIES:  1. Laboratory values at the time of admission show potassium of 4.9,      creatinine 0.8, calcium 9.2, white blood cell count 3.8, hemoglobin      12, and platelets 311,000. Troponin less than 0.01.  2. A 12-lead electrocardiogram shows normal sinus rhythm with a      rightward axis and no changes consistent with ischemia.   ASSESSMENT AND PLAN:  This is a pleasant 55 year old African American  female with multiple risk factors of coronary artery disease including  hypertension, hyperlipidemia, obesity, borderline diabetes mellitus, and  a strong family history of coronary artery disease who is admitted with  substernal chest pain that occasionally occurs with exertion and mostly  occurs after meals.  I agree with ruling her out for a myocardial  infarction with serial cardiac enzymes. If her enzymes remained negative  then she should have a stress exercise Myoview test tomorrow.  If  her  enzymes become positive then she should be referred for a left heart  catheterization tomorrow.  I also agree with checking a surface  echocardiogram.  In the meantime, she should be continued on the full  strength aspirin, beta-blocker, and statin therapy.  We will continue to  follow with this very pleasant patient.      Verne Carrow, MD  Electronically Signed     CM/MEDQ  D:  03/04/2008  T:  03/05/2008  Job:  [id number]

## 2010-10-27 ENCOUNTER — Ambulatory Visit: Payer: Self-pay | Admitting: Sports Medicine

## 2010-10-27 NOTE — Discharge Summary (Signed)
[place]  Patient:    Brandi Davenport, Brandi Davenport                     MRN: [id number] Adm. Date:  08/23/99 Disc. Date: 08/25/99 Attending:  Merlene Laughter. Renae Gloss, M.D.                           Discharge Summary  DISCHARGE DIAGNOSES:  1. Atypical chest pain.                       2. Hypertension.                       3. Abnormal troponin enzyme.  HOSPITAL COURSE:  [name] was admitted complaining of several episodes of chest pain which apparently was not relieved until she received nitroglycerin in the emergency department.  Her CK enzymes and EKGs where within normal limits; however, she did have a positive troponin enzyme.  A ______ stress test was done per Dr. Algie Coffer and was unremarkable.  It is thought that she has a noncardiac reason for her chest pain.  The most probable cause is a GI etiology as her chest pain did improve over the course of her hospitalization with antacid medications, Protonix.  She will continue with Protonix as an outpatient and a further GI workup will be pursued after discharge.  DISPOSITION:  Upon discharge, [name] was chest pain free.  She had no complaints of shortness of breath, palpitations, nausea, vomiting.  She was eating and ambulating without complications.  DISCHARGE MEDICATIONS: 1. Toprol 25 mg p.o. q.d. 2. Protonix 40 mg p.o. q.d. 3. Aspirin one p.o. q.d.  FOLLOWUP:  [name] will call Dr. Renae Gloss for a follow-up appointment in two weeks. DD:  08/25/99 TD:  08/27/99 Job: [id number]

## 2010-11-07 ENCOUNTER — Encounter: Payer: Self-pay | Admitting: Sports Medicine

## 2010-11-07 ENCOUNTER — Ambulatory Visit (INDEPENDENT_AMBULATORY_CARE_PROVIDER_SITE_OTHER): Payer: Self-pay | Admitting: Sports Medicine

## 2010-11-07 DIAGNOSIS — G44209 Tension-type headache, unspecified, not intractable: Secondary | ICD-10-CM

## 2010-11-07 DIAGNOSIS — F329 Major depressive disorder, single episode, unspecified: Secondary | ICD-10-CM

## 2010-11-07 DIAGNOSIS — L568 Other specified acute skin changes due to ultraviolet radiation: Secondary | ICD-10-CM

## 2010-11-07 DIAGNOSIS — E119 Type 2 diabetes mellitus without complications: Secondary | ICD-10-CM

## 2010-11-07 DIAGNOSIS — E785 Hyperlipidemia, unspecified: Secondary | ICD-10-CM

## 2010-11-07 DIAGNOSIS — R42 Dizziness and giddiness: Secondary | ICD-10-CM

## 2010-11-07 DIAGNOSIS — I10 Essential (primary) hypertension: Secondary | ICD-10-CM

## 2010-11-07 LAB — POCT GLYCOSYLATED HEMOGLOBIN (HGB A1C): Hemoglobin A1C: 6.9

## 2010-11-07 MED ORDER — NITROGLYCERIN 0.4 MG SL SUBL: 0.4000 mg | SUBLINGUAL_TABLET | SUBLINGUAL | Status: DC | PRN

## 2010-11-07 MED ORDER — TRIAMCINOLONE ACETONIDE 0.1 % EX OINT: TOPICAL_OINTMENT | Freq: Two times a day (BID) | CUTANEOUS | Status: AC

## 2010-11-07 MED ORDER — CITALOPRAM HYDROBROMIDE 20 MG PO TABS: 10.0000 mg | ORAL_TABLET | Freq: Every day | ORAL | Status: DC

## 2010-11-07 NOTE — Assessment & Plan Note (Signed)
Controlled with acet

## 2010-11-07 NOTE — Progress Notes (Signed)
  Subjective:    Patient ID: Brandi Davenport, female    DOB: [birth year], 55 y.o.   MRN: [id number]  HPI  HTN, HLD, DM2:  Doing well, due for recheck of numbers.  TTH:  Better with acet 1300 BID, tramadol made her sleepy/nauseated.  Rash on arms:  Itchy, worse in the summers.  Palpitations:  Approx 2-3x a week, gets fluttery in chest and has to sit down.  No overt chest pain.  Minimal lightheadedness.  Occasional sweating.  Lasts several minutes.  Myoview neg Sept 2009.    Review of Systems    See HPI Objective:   Physical Exam  Constitutional: She appears well-developed and well-nourished. No distress.  Cardiovascular: Normal rate, regular rhythm, normal heart sounds and intact distal pulses.  Exam reveals no gallop and no friction rub.   No murmur heard. Pulmonary/Chest: Effort normal and breath sounds normal. No respiratory distress. She has no wheezes. She has no rales. She exhibits no tenderness.  Musculoskeletal: She exhibits no edema.  Skin: Skin is warm and dry. She is not diaphoretic.  There is hyperpigmentation and lichenification over her dorsal forearms.  Minimal excoriations.        Assessment & Plan:

## 2010-11-07 NOTE — Assessment & Plan Note (Signed)
Doing well with current celexa dose. Working things out with husband.

## 2010-11-07 NOTE — Assessment & Plan Note (Signed)
Kenalog oint 0.1%. Avoid sun exposure to forearms.

## 2010-11-07 NOTE — Assessment & Plan Note (Signed)
Recheck A1c. Augment Metformin as needed. RTC 3 months.

## 2010-11-07 NOTE — Assessment & Plan Note (Addendum)
Recheck Lipid panel, non-fasting CMET.  ldl high, incr pravastatin to 40.

## 2010-11-07 NOTE — Patient Instructions (Signed)
Great to see you, Checking bloodwork. [facility] to have [facility] placed (72h) Nitroglycerin if you have any chest pain. Kenalog oint for arms, avoid sun, wear long sleeves if able.  Brandi Davenport. Brandi Davenport, M.D.   Cardiac Arrhythmia, Irregular Heartbeat Your heart is a muscle that works to pump blood through your body by regular contractions. The beating of your heart is controlled by a system of special pacemaker cells. These cells control the electrical activity of the heart. When the system controlling this regular beating is disturbed, a heart rhythm abnormality (arrhythmia) results. WHEN YOUR HEART SKIPS A BEAT One of the most common and least serious heart arrhythmias is called an ectopic or premature atrial heartbeat (PAC). This may be noticed as a small change in your regular pulse. A PAC originates from the top part (atrium) of the heart. Within the right atrium, the SA node is the area that normally controls the regularity of the heart. PACs occur in heart tissue outside of the SA node region. You may feel this as a skipped beat or heart flutter, especially if several occur in succession or occur frequently.   Another arrhythmia is ventricular premature complex (VCP or PVC). These extra beats start out in the bottom, more muscular chambers of the heart. In most cases a PVC is harmless. If there are underlying causes that are making the heart irritable such as an overactive thyroid or a prior heart attack PVCs may be of more concern. In a few cases, medications to control the heart rhythm may be prescribed. Things to try at home:  Cut down or avoid alcohol, tobacco and caffeine.     Get enough sleep.     Reduce stress.   Exercise more.     WHEN THE HEART BEATS TOO FAST Atrial tachycardia is a fast heart rate, which starts out in the atrium. It may last from minutes to much longer. Your heart may beat 140 to 240 times per minute instead of the normal 60 to  100.  Symptoms include a worried feeling (anxiety) and a sense that your heart is beating fast and hard.   You may be able to stop the fast rate by holding your breath or bearing down as if you were going to have a bowel movement.   This type of fast rate is usually not dangerous.  Atrial fibrillation and atrial flutter are other fast rhythms that start in the atria. Both conditions keep the atria from filling with enough blood so the heart does not work well.  Symptoms include feeling light-headed or faint.   These fast rates may be the result of heart damage or disease. Too much thyroid hormone may play a role.   There may be no clear cause or it may be from heart disease or damage.   Medication or a special electrical treatment (cardioversion) may be needed to get the heart beating normally.  Ventricular tachycardia is a fast heart rate that starts in the lower muscular chambers (ventricles) This is a serious disorder that requires treatment as soon as possible. You need someone else to get and use a small defibrillator.  Symptoms include collapse, chest pain, or being short of breath.   Treatment may include medication, procedures to improve blood flow to the heart, or an implantable cardiac defibrillator (ICD).  DIAGNOSIS  A cardiogram (EKG or ECG) will be done to see the arrhythmia, as well as lab tests to check the underlying cause.  If the extra beats or fast rate come and go, you may wear a Holter monitor that records your heart rate for a longer period of time.  SEE YOUR CAREGIVER IF YOU EXPERIENCE:  Irregular or fast heart beats (palpitations).   Skipped beats.     Lightheadedness.    Chest discomfort.   Shortness of breath.     More frequent episodes, if you are already being treated.     SEEK IMMEDIATE MEDICAL CARE IF:  You have severe chest pain, especially if the pain is crushing or pressure-like and spreads to the arms, back, neck, or jaw, or if you have  sweating, feeling sick to your stomach (nausea), or shortness of breath. THIS IS AN EMERGENCY. Do not wait to see if the pain will go away. Get medical help at once. Call 911 or 0 (operator). DO NOT drive yourself to the hospital.   You feel dizzy or faint.   You have episodes of previously documented atrial tachycardia that do not resolve with the techniques your caregiver has taught you.   Irregular or rapid heartbeats begin to occur more often than in the past, especially if they are associated with more pronounced symptoms or of longer duration.  Document Released: 05/28/2005 Document Re-Released: 08/22/2009 [facility] Patient Information 2011 [place], [place].

## 2010-11-07 NOTE — Assessment & Plan Note (Signed)
Well controlled. No changes. 

## 2010-11-07 NOTE — Assessment & Plan Note (Addendum)
Now with palpitations occurring 2x a week. Will place holter for 72h. 72h holter not avail, will do 21d event monitor.

## 2010-11-08 ENCOUNTER — Encounter (HOSPITAL_COMMUNITY): Payer: Self-pay

## 2010-11-08 LAB — COMPREHENSIVE METABOLIC PANEL
Alkaline Phosphatase: 47 U/L (ref 39–117)
BUN: 15 mg/dL (ref 6–23)
CO2: 30 mEq/L (ref 19–32)
Creat: 0.69 mg/dL (ref 0.40–1.20)
Glucose, Bld: 132 mg/dL — ABNORMAL HIGH (ref 70–99)
Sodium: 137 mEq/L (ref 135–145)
Total Bilirubin: 0.7 mg/dL (ref 0.3–1.2)

## 2010-11-08 LAB — LIPID PANEL
HDL: 55 mg/dL (ref >39)
Total CHOL/HDL Ratio: 3.2 Ratio
Triglycerides: 63 mg/dL (ref <150)

## 2010-11-09 ENCOUNTER — Telehealth: Payer: Self-pay | Admitting: Sports Medicine

## 2010-11-09 ENCOUNTER — Ambulatory Visit: Payer: Self-pay | Admitting: Sports Medicine

## 2010-11-09 MED ORDER — PRAVASTATIN SODIUM 40 MG PO TABS: 40.0000 mg | ORAL_TABLET | Freq: Every day | ORAL | Status: DC

## 2010-11-09 NOTE — Progress Notes (Signed)
Addended by: Monica Becton on: 11/09/2010 11:53 AM   Modules accepted: Orders

## 2010-11-09 NOTE — Telephone Encounter (Signed)
Called and notified pt that

## 2010-11-09 NOTE — Progress Notes (Signed)
Addended by: Monica Becton on: 11/09/2010 09:05 AM   Modules accepted: Orders

## 2010-11-09 NOTE — Telephone Encounter (Signed)
Called and notified pt that per Jaynee Eagles once she gets the bill for this service she will need to bring it to her and they would take care of it for her bill.   Called [facility] and spoke with Windell Moulding concerning this and told her what the pt would need to do once she received her bill they would take care of it.Laureen Ochs, Viann Shove

## 2010-11-23 ENCOUNTER — Telehealth: Payer: Self-pay | Admitting: Sports Medicine

## 2010-11-23 NOTE — Telephone Encounter (Signed)
I will route this to my office staff as I do not know any other places.

## 2010-11-23 NOTE — Telephone Encounter (Signed)
Please call [name] back to discuss alternatate source for heart monitor.  The place she was referred to need her to pay $100.00.  She has [employer] plan.  Cannot afford that amount

## 2010-11-28 ENCOUNTER — Telehealth: Payer: Self-pay | Admitting: Internal Medicine

## 2010-11-28 NOTE — Telephone Encounter (Signed)
Pt needs to talk someone regarding monitor per Susann Givens from [facility] call

## 2010-11-28 NOTE — Telephone Encounter (Signed)
SPOKE WITH [employer] [facility] UNABLE TO REACH PT VIA HOME NUMBER TO MAKE ARRANGEMENTS FOR MONITOR. WORK NUMBER LISTED IN FILE WAS GIVEN TO [employer] [facility].PER [employer] UNABLE TO LEAVE MESSAGE AT WORK NUMBER  WILL MAKE ORDER INACTIVE D/T  NOT  BEING ABLE TO REACH PT ./CY  LMTCB   IN ATTEMPT  TO GET VALID PHONE NUMBER./CY

## 2010-11-29 NOTE — Telephone Encounter (Signed)
We could do a referral to cards re: this. Would she be amenable.  They may want to do a loop recorder (has to be implanted).

## 2010-11-29 NOTE — Telephone Encounter (Signed)
Spoke with D. Hill and she stated that pt will have to pay $100.00 for the placement of the monitor. The program will not cover this expense, and pt cannot afford to pay please advise.Marland KitchenMarland KitchenLoralee Pacas [place]

## 2010-11-29 NOTE — Telephone Encounter (Signed)
SPOKE WITH PT THIS AM WAS TOLD HAD TO PAY $100.00 TO GET  MONITOR DOES NOT H AVE INS AND CAN'T AFFORD THE $100.00./CY

## 2010-11-29 NOTE — Telephone Encounter (Signed)
This is not a Dr Ladona Ridgel patient  Brandi Davenport is going to call patient and let her know she needs to call the doctor that ordered the monitor and follow up

## 2010-12-05 ENCOUNTER — Ambulatory Visit: Payer: Self-pay | Admitting: Sports Medicine

## 2011-01-05 ENCOUNTER — Encounter: Payer: Self-pay | Admitting: Family Medicine

## 2011-01-05 ENCOUNTER — Ambulatory Visit (INDEPENDENT_AMBULATORY_CARE_PROVIDER_SITE_OTHER): Payer: Self-pay | Admitting: Family Medicine

## 2011-01-05 DIAGNOSIS — E785 Hyperlipidemia, unspecified: Secondary | ICD-10-CM

## 2011-01-05 DIAGNOSIS — F329 Major depressive disorder, single episode, unspecified: Secondary | ICD-10-CM

## 2011-01-05 DIAGNOSIS — R21 Rash and other nonspecific skin eruption: Secondary | ICD-10-CM

## 2011-01-05 DIAGNOSIS — R5383 Other fatigue: Secondary | ICD-10-CM

## 2011-01-05 DIAGNOSIS — E119 Type 2 diabetes mellitus without complications: Secondary | ICD-10-CM

## 2011-01-05 DIAGNOSIS — R5381 Other malaise: Secondary | ICD-10-CM

## 2011-01-05 DIAGNOSIS — L568 Other specified acute skin changes due to ultraviolet radiation: Secondary | ICD-10-CM

## 2011-01-05 DIAGNOSIS — I10 Essential (primary) hypertension: Secondary | ICD-10-CM

## 2011-01-05 MED ORDER — LISINOPRIL-HYDROCHLOROTHIAZIDE 20-12.5 MG PO TABS: 1.0000 | ORAL_TABLET | Freq: Every day | ORAL | Status: DC

## 2011-01-05 MED ORDER — ZOLPIDEM TARTRATE 5 MG PO TABS: 5.0000 mg | ORAL_TABLET | Freq: Every evening | ORAL | Status: DC | PRN

## 2011-01-05 MED ORDER — PRAVASTATIN SODIUM 40 MG PO TABS: 40.0000 mg | ORAL_TABLET | Freq: Every day | ORAL | Status: DC

## 2011-01-05 MED ORDER — ZOLPIDEM TARTRATE 5 MG PO TABS: 5.0000 mg | ORAL_TABLET | Freq: Every evening | ORAL | Status: AC | PRN

## 2011-01-05 MED ORDER — PRAVASTATIN SODIUM 40 MG PO TABS: 40.0000 mg | ORAL_TABLET | Freq: Every day | ORAL | Status: AC

## 2011-01-05 NOTE — Patient Instructions (Signed)
It was great to meet you today. Please take Metformin 500 mg 1 tablet daily. Please take Ambien 5 mg 1 tablet at bedtime. Please schedule a follow up appointment with in 4 weeks. Continue to avoid sugars, foods high in carbs - like bread, potatoes, fruit. Try walking and exercising once a day. I look forward to seeing you soon, Dr. Tye Savoy

## 2011-01-11 DIAGNOSIS — R5383 Other fatigue: Secondary | ICD-10-CM | POA: Insufficient documentation

## 2011-01-11 DIAGNOSIS — R21 Rash and other nonspecific skin eruption: Secondary | ICD-10-CM | POA: Insufficient documentation

## 2011-01-11 NOTE — Assessment & Plan Note (Addendum)
Patient stopped taking Metformin last month due to diarrhea.  In March, previous PCP increased Metformin from 500 BID to 1000 BID to meet a1c goal <6.5.  Most current a1c in May:  6.9.  Discussed with patient the importance of continuing Metformin and how Metformin + diet/exercise can lead to weight loss.  Side effects should subside in 4-6 weeks.  Patient not convinced at this time.  She agrees to taking a smaller dose.  Recommend starting 500 daily and to return to clinic in September to recheck a1c.  If >7.0, will increase to 500 BID.  Patient understood/agreed with plan.

## 2011-01-11 NOTE — Assessment & Plan Note (Signed)
Rash appears to be photodermatitis vs. Eczema. Recommended Vaseline or Calamine lotion twice a day to prevent dry skin/itching. Follow up in 4 weeks.

## 2011-01-11 NOTE — Progress Notes (Signed)
  Subjective:    Patient ID: Brandi Davenport, female    DOB: 1956-04-01, 55 y.o.   MRN: [id number]  HPI  55 year old AAF presents to clinic to meet new MD and to discuss the following:  1) Fatigue: this has been going on for about one month.  Patient admits to sleeping only 4 hours per night.  She becomes drowsy around 9:30 pm and then wakes up at 1:30 am.  She cannot fall back asleep at 1:30 am.  Instead she stays awake until she leaves for work.  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  She does not take naps during the day and avoids drinks high in caffeine.  She used to take medication for insomnia, but cannot remember the name.  In addition, patient goes home to her husband who she describes as "hateful."  Per previous notes, he has a hx of infidelity and now is fighting prostate cancer.  Patient says "my marriage is nothing like I imagined it would be."  She does feel safe at home.  Denies any physical abuse, SI, or thoughts of harming others.  2) Diarrhea: patient stopped taking Metformin due to diarrhea.  States that she used to take one tablet in the morning, but it caused her to fun to the bathroom frequently which was affecting her work.  She thinks her new dose (1000 BID) is too much and refuses to take it again. Denies visual changes, decreased sensation/tingling of extremities.  Denies abdominal pain, BRBPR, change in appetite.  3) Rash: chronic issue.  Dr. Karie Schwalbe. Has given patient Kenalog cream.  Patient states the rash has been improving, but will not resolve completely.  Denies any recent antibiotic use.  Denies any contact to poison oak/ivy.  Denies any associated fever, chills, sweats, or signs of infection.  Review of Systems  Per HPI    Objective:   Physical Exam  Constitutional: She appears well-developed and well-nourished. No distress.  HENT:  Head: Normocephalic and atraumatic.  Cardiovascular: Regular rhythm and normal heart sounds.  Exam  reveals no gallop and no friction rub.   No murmur heard. Pulmonary/Chest: Breath sounds normal. She has no wheezes. She has no rales.  Skin:       Dry skin on bilateral forearms; no erythema, swelling, or open lesions.  Appears to be resolving.  Psychiatric: Her speech is normal. Judgment and thought content normal. She is slowed and withdrawn. She exhibits a depressed mood.       Flat affect          Assessment & Plan:

## 2011-01-11 NOTE — Assessment & Plan Note (Addendum)
Likely secondary to multiple factors.  1) Social: patient's husband recently diagnosed with prostate cancer and he is not coping well, 2) Insomnia: sleeps only 4 hours per night, and 3) Depression: no improvement with Celexa.  Plan: will give Ambien 5 QHS prn for insomnia.  Hopefully, patient will be able to sleep > 4 hours with Ambien.  Regarding family issues, I offered counseling services and referral to Dr. Pascal Lux if symptoms become worse.  Patient declines at this time.  For depression, please see problem list "depression".  Advised patient to follow up with me in 4 weeks.

## 2011-01-11 NOTE — Assessment & Plan Note (Signed)
Patient was seen by Dr. Pascal Lux in January, but declines a follow up appointment at this time. Celexa does not seem to be working as well as it used to.   I am reluctant to increase dose and/or add second agent at this time as this will not address underlying issue. Will continue Celexa for now and arrange for follow up in 4 weeks. At that time, will highly recommend meeting with Dr. Pascal Lux and will give a list of counseling services/crisis info. If patient ever decides to leave husband.

## 2011-01-31 ENCOUNTER — Ambulatory Visit: Payer: Self-pay | Admitting: Family Medicine

## 2011-03-02 LAB — URINE CULTURE
Colony Count: NO GROWTH
Culture: NO GROWTH

## 2011-03-02 LAB — URINALYSIS, ROUTINE W REFLEX MICROSCOPIC
Glucose, UA: NEGATIVE
Hgb urine dipstick: NEGATIVE
Specific Gravity, Urine: 1.034 — ABNORMAL HIGH
pH: 6

## 2011-03-12 LAB — CK TOTAL AND CKMB (NOT AT ARMC)
CK, MB: 1
Relative Index: INVALID
Total CK: 94

## 2011-03-12 LAB — CARDIAC PANEL(CRET KIN+CKTOT+MB+TROPI)
CK, MB: 3.1
Relative Index: INVALID
Total CK: 163

## 2011-03-12 LAB — CBC
MCHC: 34.5
MCV: 83.7
RBC: 4.08
RDW: 13
WBC: 6

## 2011-03-12 LAB — GLUCOSE, CAPILLARY: Glucose-Capillary: 126 — ABNORMAL HIGH

## 2011-03-12 LAB — POCT CARDIAC MARKERS: Troponin i, poc: <0.05

## 2011-03-12 LAB — DIFFERENTIAL
Eosinophils Absolute: 0.1
Eosinophils Relative: 2
Lymphs Abs: 1.8
Monocytes Absolute: 0.4
Monocytes Relative: 10

## 2011-03-12 LAB — LIPASE, BLOOD: Lipase: 19

## 2011-03-12 LAB — COMPREHENSIVE METABOLIC PANEL
ALT: 14
AST: 28
Albumin: 3.8
Alkaline Phosphatase: 47
Calcium: 9.2
GFR calc Af Amer: >60
Potassium: 4.9
Sodium: 135
Total Protein: 7.2

## 2011-03-12 LAB — BASIC METABOLIC PANEL
CO2: 30
Chloride: 103
Creatinine, Ser: 0.76
GFR calc Af Amer: >60
Glucose, Bld: 92

## 2011-03-12 LAB — B-NATRIURETIC PEPTIDE (CONVERTED LAB): Pro B Natriuretic peptide (BNP): <30

## 2011-03-12 LAB — TSH: TSH: 1.63

## 2011-06-18 ENCOUNTER — Encounter: Payer: Self-pay | Admitting: Family Medicine

## 2011-06-18 ENCOUNTER — Ambulatory Visit (INDEPENDENT_AMBULATORY_CARE_PROVIDER_SITE_OTHER): Payer: Self-pay | Admitting: Family Medicine

## 2011-06-18 VITALS — BP 138/84 | HR 94 | Temp 98.7°F | Ht 67.0 in | Wt 231.5 lb

## 2011-06-18 DIAGNOSIS — R059 Cough, unspecified: Secondary | ICD-10-CM

## 2011-06-18 DIAGNOSIS — R05 Cough: Secondary | ICD-10-CM | POA: Insufficient documentation

## 2011-06-18 MED ORDER — BENZONATATE 200 MG PO CAPS: 200.0000 mg | ORAL_CAPSULE | Freq: Two times a day (BID) | ORAL | Status: AC | PRN

## 2011-06-18 NOTE — Progress Notes (Signed)
  Subjective:     Brandi Davenport is a 56 y.o. female who presents for evaluation of nonproductive cough. Symptoms began about 2-3 weeks ago, after Christmas.  She complains of sore throat, dry cough, chest and throat pain after coughing spells.  Symptoms have been unchanged since that time.  Also endorses nausea and decreased appetite.  Denies any fever, vomiting, abdominal pain, diarrhea.  She has tried OTC Mucinex and Robitussin with no relief.  Cough is constant throughout day.  GERD is well controlled on Omeprazole.  The following portions of the patient's history were reviewed and updated as appropriate: allergies, current medications, past medical history and problem list.  Review of Systems Pertinent items are noted in HPI.    Objective:    General appearance: alert, cooperative, fatigued and no distress Nose: Nares normal. Septum midline. Mucosa normal. No drainage or sinus tenderness. Throat: lips, mucosa, and tongue normal; teeth and gums normal; no erythema or exudate Neck: no adenopathy and supple, symmetrical, trachea midline Lungs: clear to auscultation bilaterally, normal effort Heart: regular rate and rhythm, 2/6 SEM   Assessment:    Cough  - likely secondary to viral URI vs. GERD   Plan:     Cough of unknown etiology - likely URI.  Will treat with Tessalon perles and Cepacol lozenges. GERD - well controlled on Prilosec. May be secondary to Lisinopril/HCTZ - will continue this for now. Patient to follow up in 2 weeks if symptoms worsen, stop ACEi at that time. Rest, fluids, acetaminophen, and humidification. Follow up as needed for persistent, worsening cough, or appearance of new symptoms.

## 2011-06-18 NOTE — Patient Instructions (Signed)
Please pick up Tessalon perles form pharmacy and take as directed. Pick up Cepacol cough drops at [facility] to soothe throat. Drink plenty of fluids and get plenty of rest. If symptoms persist for 2 more weeks, call MD or return to clinic.

## 2011-10-04 ENCOUNTER — Encounter: Payer: Self-pay | Admitting: Family Medicine

## 2011-10-04 ENCOUNTER — Ambulatory Visit (INDEPENDENT_AMBULATORY_CARE_PROVIDER_SITE_OTHER): Payer: Self-pay | Admitting: Family Medicine

## 2011-10-04 VITALS — BP 118/78 | HR 67 | Temp 98.7°F | Ht 67.0 in | Wt 232.0 lb

## 2011-10-04 DIAGNOSIS — I10 Essential (primary) hypertension: Secondary | ICD-10-CM

## 2011-10-04 DIAGNOSIS — F329 Major depressive disorder, single episode, unspecified: Secondary | ICD-10-CM

## 2011-10-04 DIAGNOSIS — E785 Hyperlipidemia, unspecified: Secondary | ICD-10-CM

## 2011-10-04 DIAGNOSIS — E119 Type 2 diabetes mellitus without complications: Secondary | ICD-10-CM

## 2011-10-04 LAB — POCT UA - MICROALBUMIN: Microalbumin Ur, POC: 10 mg/dL

## 2011-10-04 MED ORDER — LISINOPRIL-HYDROCHLOROTHIAZIDE 20-12.5 MG PO TABS: 1.0000 | ORAL_TABLET | Freq: Every day | ORAL | Status: DC

## 2011-10-04 MED ORDER — GLIPIZIDE 5 MG PO TABS: 5.0000 mg | ORAL_TABLET | Freq: Every day | ORAL | Status: DC

## 2011-10-04 MED ORDER — CITALOPRAM HYDROBROMIDE 20 MG PO TABS: 10.0000 mg | ORAL_TABLET | Freq: Every day | ORAL | Status: DC

## 2011-10-04 NOTE — Assessment & Plan Note (Signed)
Will order future fasting lipid panel. Patient to return in 4 weeks fasting prior to her appointment with me.

## 2011-10-04 NOTE — Assessment & Plan Note (Addendum)
Because patient did not tolerate metformin, will start glipizide 5 mg daily. Patient to return in 4 weeks for follow up.Will repeat A1c in 3 months. Lifestyle modification discussed with patient. Will need to make sure patient has a foot doctor and eye doctor at next visit.

## 2011-10-04 NOTE — Patient Instructions (Signed)
It is good to see you today. [facility] taking glipizide for diabetes, Celexa for depression, lisinopril HCTZ for elevated blood pressure. Continue to avoid foods that are high in sugar and carbohydrates. Try eating more lean protein like meats and vegetables. Try to increase physical activity by walking 3-4 times a week. If you develop any chest pain shortness of breath difficulty breathing upon walking, please call M.D. Your next mammogram will be scheduled in 2017. Schedule lab appointment on Monday morning anytime in May. Do not eat or drink anything prior to your appointment. Schedule a followup appointment with your PCP in 4 weeks.

## 2011-10-04 NOTE — Assessment & Plan Note (Signed)
Continue lisinopril HCTZ 20-12.5 mg daily. Recheck blood pressure in 4 weeks.

## 2011-10-04 NOTE — Assessment & Plan Note (Signed)
Will restart Celexa 20 mg.  Follow up symptoms in 4 weeks.

## 2011-10-04 NOTE — Progress Notes (Signed)
  Subjective:    Patient ID: Brandi Davenport, female    DOB: 1956/04/23, 56 y.o.   MRN: [id number]  HPI  The patient presents to clinic for her annual complete physical.  Hypertension: blood pressure was normal today, 118/17.  She took her last blood pressure medication this morning,and is requesting a refill.  Denies any chest pain, shortness of breath, headache, changes in vision.  Diabetes type 2: patient was diagnosed with diabetes per previous PCP. She was started on metformin, took this for her several months, decided to stop it because it was causing abdominal discomfort. She decided to watch her diet and exercise instead.  A1c today is 7.5. Patient admits to eating foods high in carbohydrates. She denies any headache, numbness tingling in her extremities. She does endorse feelings of excessive sweating, heart palpitations, fatigue.  Depression: patient was taking Celexa 20 mg per previous PCP.  She stopped taking the medication several months ago when she lost insurance. She now has [facility] and is interested in restarting medication. Her depression screening score was 18. Currently, home life has been very stressful. Her son was recently arrested and incarcerated. Patient complains mostly of depressed mood, fatigue, decreased energy. Denies any suicidal or homicidal thoughts.  Preventative medicine/screening: Patient's last mammogram was in 2012. She refuses to go to a gastroenterologist for a screening colonoscopy at this time. However, she will let me know if she develops any symptoms of bloody stool or abdominal pain or weight loss. She has not had any Pap smears since her hysterectomy.   Review of Systems per history of present illness    Objective:   Physical Exam  BP 118/78  Pulse 67  Temp(Src) 98.7 F (37.1 C) (Oral)  Ht 5\' 7"  (1.702 m)  Wt 232 lb (105.235 kg)  BMI 36.34 kg/m2  General Appearance:    Alert, cooperative, no distress, appears stated age  Head:     Normocephalic, without obvious abnormality, atraumatic  Throat:   Lips, mucosa, and tongue normal; teeth and gums normal  Neck:   Supple, symmetrical, trachea midline, no adenopathy;    thyroid:  no enlargement/tenderness/nodules; no carotid   bruit or JVD  Lungs:     Clear to auscultation bilaterally, respirations unlabored   Heart:    Regular rate and rhythm, S1 and S2 normal, no murmur, rub   or gallop  Abdomen:     Soft, non-tender, bowel sounds active all four quadrants,    no masses, no organomegaly  Extremities:   Extremities normal, atraumatic, no cyanosis or edema  Skin:   Skin color, texture, turgor normal, no rashes or lesions               Assessment & Plan:

## 2011-10-22 ENCOUNTER — Other Ambulatory Visit: Payer: Self-pay

## 2011-10-22 DIAGNOSIS — I10 Essential (primary) hypertension: Secondary | ICD-10-CM

## 2011-10-22 LAB — COMPREHENSIVE METABOLIC PANEL
BUN: 20 mg/dL (ref 6–23)
CO2: 30 mEq/L (ref 19–32)
Calcium: 9.4 mg/dL (ref 8.4–10.5)
Chloride: 104 mEq/L (ref 96–112)
Creat: 0.69 mg/dL (ref 0.50–1.10)

## 2011-10-22 LAB — LIPID PANEL
Cholesterol: 176 mg/dL (ref 0–200)
HDL: 57 mg/dL (ref >39)
Total CHOL/HDL Ratio: 3.1 Ratio

## 2011-10-22 LAB — CBC
HCT: 34.2 % — ABNORMAL LOW (ref 36.0–46.0)
MCV: 82.8 fL (ref 78.0–100.0)
RBC: 4.13 MIL/uL (ref 3.87–5.11)
WBC: 4.9 10*3/uL (ref 4.0–10.5)

## 2011-10-22 NOTE — Progress Notes (Signed)
CMP,FLP AND CBC DONE TODAY [name] 

## 2011-10-26 ENCOUNTER — Encounter: Payer: Self-pay | Admitting: Family Medicine

## 2011-11-14 ENCOUNTER — Ambulatory Visit (INDEPENDENT_AMBULATORY_CARE_PROVIDER_SITE_OTHER): Payer: Self-pay | Admitting: Family Medicine

## 2011-11-14 ENCOUNTER — Encounter: Payer: Self-pay | Admitting: Family Medicine

## 2011-11-14 VITALS — BP 113/77 | HR 75 | Temp 99.1°F | Ht 67.0 in | Wt 233.0 lb

## 2011-11-14 DIAGNOSIS — E119 Type 2 diabetes mellitus without complications: Secondary | ICD-10-CM

## 2011-11-14 DIAGNOSIS — R5383 Other fatigue: Secondary | ICD-10-CM

## 2011-11-14 DIAGNOSIS — R5381 Other malaise: Secondary | ICD-10-CM

## 2011-11-14 DIAGNOSIS — I10 Essential (primary) hypertension: Secondary | ICD-10-CM

## 2011-11-14 MED ORDER — FERROUS GLUCONATE 324 (38 FE) MG PO TABS: 324.0000 mg | ORAL_TABLET | Freq: Every day | ORAL | Status: DC

## 2011-11-14 MED ORDER — OMEPRAZOLE 20 MG PO CPDR: 20.0000 mg | DELAYED_RELEASE_CAPSULE | Freq: Every day | ORAL | Status: DC

## 2011-11-14 MED ORDER — SITAGLIPTIN PHOSPHATE 100 MG PO TABS: 100.0000 mg | ORAL_TABLET | Freq: Every day | ORAL | Status: DC

## 2011-11-14 NOTE — Assessment & Plan Note (Signed)
Patient stopped taking Glipizide because it was causing muscle cramps in her hands. At this point, she has not tolerated Metformin or Glipizide. Will discuss with Pharmacy team - consider Victoza.Brandi Davenport  Her last A1c in April was 7.5. Follow up in 1 month for DM.

## 2011-11-14 NOTE — Patient Instructions (Signed)
It was good to see you today. Your blood pressure looks great today. I sent Omeprazole and Iron supplements to your pharmacy. I will send a new diabetes medication to your pharmacy later today. Return in 4-6 weeks for TSH and Alc check.

## 2011-11-14 NOTE — Assessment & Plan Note (Addendum)
Likely multi-factorial - obesity vs. Anemia (hgb 10.4) vs. Depression vs. Poor sleep hygiene. Will check TSH at next visit. Continue Cymbalta and start Iron supplements. Will discuss sleep hygiene and Nutrition at next visit.  May consider referral to DM/Nutrition.

## 2011-11-14 NOTE — Progress Notes (Signed)
  Subjective:    Patient ID: Brandi Davenport, female    DOB: Mar 23, 1956, 56 y.o.   MRN: [id number]  HPI  Patient presents to clinic for follow up HTN, DM, and medication refills.  She also has questions about recent lab results.  HYPERTENSION: BP is 113/77.  Patient currently takes Lisinopril-HCTZ 20-12.5 daily.  She is under stress at home, but has been exercising and eating a healthy diet with the exception of [employer] biscuits every AM.  Patient not ready to give those up.  Denies any HA, changes in vision, SOB, CP.  Does complain of fatigue - "tired all the time" for the last year.    DM, TYPE 2:  Patient has stopped taking Glipizide yesterday because she developed pain/cramps in both hands.  She said medication made pain worse.  Patient currently walks/exercises and adheres to CHO modified diet.  She has tried Metformin before, stopped due to GI distress and cramps.  Her last A1c was elevated at 7.5 in April.  Denies any blurry vision, headache, numbness/tingling of extremities, foot ulcers.  She does not check her BG at home.   Review of Systems  Per HPI    Objective:   Physical Exam  Constitutional: She appears well-developed and well-nourished. No distress.  Neck: Normal range of motion. Neck supple.  Pulmonary/Chest: Effort normal and breath sounds normal.  Neurological: She is alert. No cranial nerve deficit.        Assessment & Plan:

## 2011-11-14 NOTE — Assessment & Plan Note (Signed)
BP well-controlled on current regimen.  

## 2011-11-14 NOTE — Progress Notes (Signed)
Addended by: Tye Savoy, Dalyce Renne on: 11/14/2011 12:22 PM   Modules accepted: Orders

## 2011-11-16 ENCOUNTER — Telehealth: Payer: Self-pay | Admitting: *Deleted

## 2011-11-16 MED ORDER — GLYBURIDE 5 MG PO TABS: 5.0000 mg | ORAL_TABLET | Freq: Every day | ORAL | Status: DC

## 2011-11-16 MED ORDER — ESOMEPRAZOLE MAGNESIUM 20 MG PO PACK: 20.0000 mg | PACK | Freq: Every day | ORAL | Status: DC

## 2011-11-16 NOTE — Telephone Encounter (Signed)
Pharmacy called - patient cannot afford the Januvia and Omeprazole and was asking if cheaper options could be substituted.  Please call pharmacy back ([facility]) at [phone].

## 2011-11-16 NOTE — Telephone Encounter (Signed)
Sent 2 different Rx to pharmacy.  See if that works.

## 2011-11-20 ENCOUNTER — Encounter: Payer: Self-pay | Admitting: *Deleted

## 2011-11-20 ENCOUNTER — Telehealth: Payer: Self-pay | Admitting: Family Medicine

## 2011-11-20 NOTE — Telephone Encounter (Signed)
Patient calling back regarding new rx.  Need to have something else sent.

## 2011-11-20 NOTE — Telephone Encounter (Signed)
Patient calling because she is unable to afford meds---generic Nexium, Glyburide, and Iron.  Gets her meds from [facility] and these are not on their $4 plan.  Patient has orange card.    Spoke with pharmacy and they do not have  Nexium or Glyburide, but can substitute Iron with Ferrous Sulfate for $6.  Patient can get Nexium from MAP program for free.  Will check with Dr. Tye Savoy to see if she wants to make any med changes and call patient back.  Gaylene Brooks, RN

## 2011-11-21 NOTE — Telephone Encounter (Signed)
This encounter was created in error - please disregard.

## 2011-11-21 NOTE — Telephone Encounter (Signed)
I have reviewed [facility]'s $4 list and both Glyburide and Glipizide and Glyburide are on the list.  Please inform patient that I will call pharmacy on Thursday and find out why they are charging too much.  In the meantime, please tell patient to control diabetes through diet and exercise until we can get this worked out.

## 2011-11-22 ENCOUNTER — Telehealth: Payer: Self-pay | Admitting: Family Medicine

## 2011-11-22 NOTE — Telephone Encounter (Signed)
Called patient but unable to leave message due to voicemail not set up.  Will call patient back later today.  Gaylene Brooks, RN

## 2011-11-22 NOTE — Telephone Encounter (Signed)
I spoke to [facility] pharmacy this morning.  Glyburide 5 mg 90 tablets costs $10.  I could give her a 30 day supply for $4, but that is the cheapest option. 90 tablets for $10 is actually the better deal.  Please let her know that the Rx is still at [facility] for pick up.  Thanks.

## 2011-11-23 NOTE — Telephone Encounter (Signed)
Called patient again.  Unable to leave message due to voicemail not set up.  Will attempt to call patient back later today.  Gaylene Brooks, RN

## 2011-11-29 ENCOUNTER — Telehealth: Payer: Self-pay | Admitting: Family Medicine

## 2011-11-29 NOTE — Telephone Encounter (Signed)
Pt cannot afford the Nexium and needs to get Omeprazole 20mg  instead  [facility]

## 2011-11-30 MED ORDER — OMEPRAZOLE 20 MG PO CPDR: 20.0000 mg | DELAYED_RELEASE_CAPSULE | Freq: Every day | ORAL | Status: DC

## 2011-11-30 NOTE — Telephone Encounter (Signed)
Called back and verbally gave rx to pharmacist for omeprazole.

## 2011-11-30 NOTE — Telephone Encounter (Signed)
Called to [facility]. Message regarding RX left on their voicemail.

## 2011-11-30 NOTE — Telephone Encounter (Signed)
I put the order for Omeprazole but I cannot print it from [facility].  Could you please call it in to [facility]?  Thank you!

## 2011-12-24 ENCOUNTER — Ambulatory Visit (INDEPENDENT_AMBULATORY_CARE_PROVIDER_SITE_OTHER): Payer: Self-pay | Admitting: Family Medicine

## 2011-12-24 ENCOUNTER — Encounter: Payer: Self-pay | Admitting: Family Medicine

## 2011-12-24 VITALS — BP 117/79 | HR 67 | Ht 67.0 in | Wt 233.0 lb

## 2011-12-24 DIAGNOSIS — I10 Essential (primary) hypertension: Secondary | ICD-10-CM

## 2011-12-24 DIAGNOSIS — E119 Type 2 diabetes mellitus without complications: Secondary | ICD-10-CM

## 2011-12-24 MED ORDER — METFORMIN HCL 500 MG PO TABS: 500.0000 mg | ORAL_TABLET | Freq: Every day | ORAL | Status: DC

## 2011-12-24 MED ORDER — OMEPRAZOLE 20 MG PO CPDR: 20.0000 mg | DELAYED_RELEASE_CAPSULE | Freq: Every day | ORAL | Status: DC

## 2011-12-24 NOTE — Progress Notes (Signed)
  Subjective:     Brandi Davenport is a 56 y.o. female who presents for follow up of diabetes.. Current symptoms include: nausea and "fluttering in stomach.". Patient denies foot ulcerations, hyperglycemia, hypoglycemia , paresthesia of the feet and visual disturbances. Evaluation to date has been: hemoglobin A1C. Home sugars: patient does not check sugars. Current treatments: she was taking Glyburide 5 mg daily, but she states that she feels nauseated after she takes it.  Patient has been cutting back on biscuits for breakfast and junk food.  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  She denies any recent weight loss or weight gain.  She is due for A1C today, last A1C was 7.5.  Hypertension: BP well controlled.  No side effects of symptoms.  Denies any changes in vision, numbness or tingling of extremities, headache, chest pain, or SOB.   The following portions of the patient's history were reviewed and updated as appropriate: allergies, current medications, past family history, past medical history, past social history, past surgical history and problem list.  Review of Systems Pertinent items are noted in HPI.    Objective:    General appearance: alert, cooperative and no distress Lungs: clear to auscultation bilaterally Heart: regular rate and rhythm, S1, S2 normal, no murmur, click, rub or gallop Abdomen: soft, non-tender; bowel sounds normal; no masses,  no organomegaly Extremities: extremities normal, atraumatic, no cyanosis or edema  Foot exam: dry, cracked feet but no open wounds or ulcers; normal pin-prick sensation  Laboratory: No components found with this basename: A1C      Assessment:    Diabetes mellitus Type II, under good control.    Plan:    Addressed ADA diet. Encouraged aerobic exercise. Reminded to get yearly retinal exam. Follow up in 3 months or as needed. Stop Glyburide and restart Metformin.  Return if has any concerns or side effects.    Continue current regimen for blood pressure.

## 2011-12-24 NOTE — Assessment & Plan Note (Signed)
Blood pressure well-controlled on lisinopril HCTZ. Continue current medications.

## 2011-12-24 NOTE — Patient Instructions (Addendum)
Your a1c dropped from 7.5 to 6.6 which is great! Keep up the good work: eating right and staying active. Stop Glyburide and start Metformin 500 mg daily at dinner time. Your blood pressure is well controlled too. Schedule follow up appointment with me in 3 months.

## 2011-12-24 NOTE — Assessment & Plan Note (Signed)
A1c decreased from 7.5 to 6.6 on Glyburide, however patient says it makes her nauseated. She has started eating a CHO modified diet and stays active. Will stop Glyburide and restart Metformin 500 mg daily and hope sugars stay well controlled and will help patient lose weight. Follow up with me in 3 months.

## 2012-01-01 ENCOUNTER — Ambulatory Visit: Payer: Self-pay

## 2012-01-01 ENCOUNTER — Other Ambulatory Visit: Payer: Self-pay | Admitting: Occupational Medicine

## 2012-01-01 DIAGNOSIS — M25539 Pain in unspecified wrist: Secondary | ICD-10-CM

## 2012-02-12 ENCOUNTER — Ambulatory Visit (INDEPENDENT_AMBULATORY_CARE_PROVIDER_SITE_OTHER): Payer: Self-pay | Admitting: Family Medicine

## 2012-02-12 ENCOUNTER — Encounter: Payer: Self-pay | Admitting: Family Medicine

## 2012-02-12 VITALS — BP 134/71 | HR 74 | Ht 67.0 in | Wt 233.0 lb

## 2012-02-12 DIAGNOSIS — R238 Other skin changes: Secondary | ICD-10-CM

## 2012-02-12 DIAGNOSIS — R519 Headache, unspecified: Secondary | ICD-10-CM | POA: Insufficient documentation

## 2012-02-12 DIAGNOSIS — L853 Xerosis cutis: Secondary | ICD-10-CM | POA: Insufficient documentation

## 2012-02-12 DIAGNOSIS — R51 Headache: Secondary | ICD-10-CM

## 2012-02-12 MED ORDER — FERROUS GLUCONATE 324 (38 FE) MG PO TABS: 324.0000 mg | ORAL_TABLET | Freq: Every day | ORAL | Status: DC

## 2012-02-12 NOTE — Progress Notes (Signed)
  Subjective:    Patient ID: Brandi Davenport, female    DOB: 1955/08/29, 56 y.o.   MRN: [id number]  HPI  Patient presents to clinic to discuss dry, cracked feet, headache.  Dry skin: She has had dry, cracked skin on bilateral feet for several months, but now it is getting worse.  She is a diabetic, but last foot exam revealed no ulcers or decreased sensation.  Patient has tried applying Vaseline with little relief.  She denies any pain or open wounds.  She denies any numbness/tingling of extremities.  Alc was 6.6 last month.  Headache: Patient complains of sharp pains located bilateral temporal lobes.  Pain comes and goes but is becoming more frequent.  She has been under a lot of stress recently.  She and her husband have separated 6 months ago and she does not have a good relationship with him or her son.  Patient has not been sleeping well, waking up several times during the night.  She denies any hx of migraines in her family.  HA are associated with "seeing white spots" and are exacerbated by bright lights and noises.  Aleve and Tylenol have not improved symptoms.  Denies any blurry vision, numbness or tingling of extremities, or weakness.   Review of Systems  Per HPI    Objective:   Physical Exam  Constitutional: She is oriented to person, place, and time.  HENT:  Head: Normocephalic and atraumatic.  Neck: Normal Davenport of motion. Neck supple.  Pulmonary/Chest: Effort normal and breath sounds normal. She has no wheezes. She has no rales. She exhibits no tenderness.  Neurological: She is alert and oriented to person, place, and time.       Grossly normal.  Skin:       Skin: dry, cracked skin on bottom of feet bilaterally; no open wounds or ulcers           Assessment & Plan:

## 2012-02-12 NOTE — Assessment & Plan Note (Signed)
Advised patient to pick up OTC Burt's Bees foot cream and to monitor feet for open wounds or signs of infection.

## 2012-02-12 NOTE — Assessment & Plan Note (Signed)
Denies pain at this time.  Intermittent HA likely due to recent stress and tension, lack of sleep. Advised patient to start Melatonin OTC as needed at bedtime. Advised patient to use heating pads in addition to analgesics. Headache diary given to patient.   Follow up in 1 month or sooner as needed.

## 2012-02-12 NOTE — Patient Instructions (Addendum)
Return to clinic in 1 month to recheck diabetes labs. For dry skin on feet, purchase over the counter Burt's Bees foot cream at [employer]. For sleep aid, you may purchase over the counter Melatonin and take as directed at bedtime. I will send refill for Iron supplements.  Take three times a day.  I will recheck labs in October. Fill out headache diary and bring back in October.

## 2012-03-20 ENCOUNTER — Ambulatory Visit (INDEPENDENT_AMBULATORY_CARE_PROVIDER_SITE_OTHER): Payer: Self-pay | Admitting: Family Medicine

## 2012-03-20 ENCOUNTER — Encounter: Payer: Self-pay | Admitting: Family Medicine

## 2012-03-20 VITALS — BP 114/77 | HR 75 | Temp 98.0°F | Ht 67.0 in | Wt 235.2 lb

## 2012-03-20 DIAGNOSIS — G43909 Migraine, unspecified, not intractable, without status migrainosus: Secondary | ICD-10-CM

## 2012-03-20 DIAGNOSIS — E119 Type 2 diabetes mellitus without complications: Secondary | ICD-10-CM

## 2012-03-20 MED ORDER — KETOROLAC TROMETHAMINE 60 MG/2ML IM SOLN
60.0000 mg | Freq: Once | INTRAMUSCULAR | Status: AC
  Administered 2012-03-20: 60 mg via INTRAMUSCULAR

## 2012-03-20 MED ORDER — METFORMIN HCL 1000 MG PO TABS: 1000.0000 mg | ORAL_TABLET | Freq: Two times a day (BID) | ORAL | Status: DC

## 2012-03-20 MED ORDER — CYCLOBENZAPRINE HCL 5 MG PO TABS: 5.0000 mg | ORAL_TABLET | Freq: Three times a day (TID) | ORAL | Status: DC | PRN

## 2012-03-20 MED ORDER — DICLOFENAC SODIUM 75 MG PO TBEC: 75.0000 mg | DELAYED_RELEASE_TABLET | Freq: Two times a day (BID) | ORAL | Status: DC

## 2012-03-20 NOTE — Assessment & Plan Note (Signed)
I still think this is stress/tension related, but it could also be migraine headache.  No focal neurologic deficits on exam.  BP normal. Plan: - Will give Toradol 60 IM now - Voltaren 75 mg BID x 2 weeks - Encouraged better sleep hygiene, heating pads, massages - Follow up PRN

## 2012-03-20 NOTE — Patient Instructions (Addendum)
Diabetes: Your Hemoglobin A1c increased from 6.6 to 7.5. Start taking Metformin 1000 mg twice per day with meals. Try to eat sugar free candy/desserts from now on. Continue to walk everyday.  Keep up the good work so far.  For headaches: We will give you a shot of pain medication in the office today. Take Diclofenac twice a day for 2 weeks. Try heating pads to forehead and back of your neck. Avoid caffeine, soda, foods high in sodium.   Basic Carbohydrate Counting Basic carbohydrate counting is a way to plan meals. It is done by counting the amount of carbohydrate in foods. Foods that have carbohydrates are starches (grains, beans, starchy vegetables) and sweets. Eating carbohydrates increases blood glucose (sugar) levels. People with diabetes use carbohydrate counting to help keep their blood glucose at a normal level.  COUNTING CARBOHYDRATES IN FOODS The first step in counting carbohydrates is to learn how many carbohydrate servings you should have in every meal. A dietitian can plan this for you. After learning the amount of carbohydrates to include in your meal plan, you can start to choose the carbohydrate-containing foods you want to eat.  There are 2 ways to identify the amount of carbohydrates in the foods you eat.  Read the Nutrition Facts panel on food labels. You need 2 pieces of information from the Nutrition Facts panel to count carbohydrates this way:  Serving size.  Total carbohydrate (in grams). Decide how many servings you will be eating. If it is 1 serving, you will be eating the amount of carbohydrate listed on the panel. If you will be eating 2 servings, you will be eating double the amount of carbohydrate listed on the panel.   Learn serving sizes. A serving size of most carbohydrate-containing foods is about 15 grams (g). Listed below are single serving sizes of common carbohydrate-containing foods:  1 slice bread.   cup unsweetened, dry cereal.   cup hot  cereal.   cup rice.   cup mashed potatoes.   cup pasta.  1 cup fresh fruit.   cup canned fruit.  1 cup milk (whole, 2%, or skim).   cup starchy vegetables (peas, corn, or potatoes). Counting carbohydrates this way is similar to looking on the Nutrition Facts panel. Decide how many servings you will eat first. Multiply the number of servings you eat by 15 g. For example, if you have 2 cups of strawberries, you had 2 servings. That means you had 30 g of carbohydrate (2 servings x 15 g = 30 g). CALCULATING CARBOHYDRATES IN A MEAL Sample dinner  3 oz chicken breast.   cup brown rice.   cup corn.  1 cup fat-free milk.  1 cup strawberries with sugar-free whipped topping. Carbohydrate calculation First, identify the foods that contain carbohydrate:  Rice.  Corn.  Milk.  Strawberries. Calculate the number of servings eaten:  2 servings rice.  1 serving corn.  1 serving milk.  1 serving strawberries. Multiply the number of servings by 15 g:  2 servings rice x 15 g = 30 g.  1 serving corn x 15 g = 15 g.  1 serving milk x 15 g = 15 g.  1 serving strawberries x 15 g = 15 g. Add the amounts to find the total carbohydrates eaten: 30 g + 15 g + 15 g + 15 g = 75 g carbohydrate eaten at dinner. Document Released: 05/28/2005 Document Revised: 08/20/2011 Document Reviewed: 04/13/2011 [facility] Patient Information 2013 [place], [place].

## 2012-03-20 NOTE — Assessment & Plan Note (Addendum)
Hemoglobin A1c increased from 6.6. To 7.5. Patient was well controlled on sulfonylurea, but it made her nauseated so stopped it and started Metformin 500 BID. Her diet has also contributed to this - still eating ice cream and not adhering to low CHO diet. Plan: - Increase Metformin to 1000 BID - Encouraged diet and exercise - especially sugar free foods - Follow up in 3 months

## 2012-03-20 NOTE — Progress Notes (Signed)
  Subjective:    Patient ID: Brandi Davenport, female    DOB: 25-Jul-1955, 56 y.o.   MRN: [id number]  HPI  Headache:  Patient has had HA for about 1.5 months.  Located bilateral temporal lobes and radiates down to posterior neck.  Described as throbbing, annoying.  Lasting all day long.  Denies any recent injury or trauma.  Not relieved by sleep or Aleve.  She has not tried heating pads yet.  Patient denies any hx of migraine but it is on her Problem List from when she was a patient at [employer].  She has had a HA everyday this month.  Currently, pain is 9/10.  Denies any blurry vision, numbness/tingling of extremities, slurred speech, or unilateral weakness.  She does does complain of shooting pain in LT lower extremity with walking, but no burning sensation.  She also endorses feeling lightheaded and bright lights worsen HA.  Denies any associated nausea/vomiting.    Diabetes, Type 2:  Her last A1c was 6.6 when she was taking sulfonylurea but this made her nauseated.  She agreed to stop it and restart Metformin (this was stopped before due to GI distress).  She has gained about 2 lb in one month and admits to eating regular ice cream last night.  Otherwise, she tries to avoid fried foods and walks a few days per week.  She denies any signs of neuropathy.  She checks her feet occasionally, but does not see an eye doctor.  She does not check BG at home, but does come to clinic every 3 months for A1c.    Review of Systems  Per HPI    Objective:   Physical Exam  Constitutional: No distress.  HENT:  Head: Normocephalic and atraumatic.  Eyes: Conjunctivae normal and EOM are normal.  Neck: Normal Davenport of motion.  Neurological: She is alert. No cranial nerve deficit. Coordination normal.  Skin: Skin is warm.          Assessment & Plan:

## 2012-03-20 NOTE — Addendum Note (Signed)
Addended by: Jimmy Footman K on: 03/20/2012 01:09 PM   Modules accepted: Orders

## 2012-07-07 ENCOUNTER — Encounter: Payer: Self-pay | Admitting: Family Medicine

## 2012-07-07 ENCOUNTER — Ambulatory Visit (INDEPENDENT_AMBULATORY_CARE_PROVIDER_SITE_OTHER): Payer: Self-pay | Admitting: Family Medicine

## 2012-07-07 VITALS — BP 125/76 | HR 82 | Temp 99.1°F | Ht 67.0 in | Wt 226.5 lb

## 2012-07-07 DIAGNOSIS — F329 Major depressive disorder, single episode, unspecified: Secondary | ICD-10-CM

## 2012-07-07 DIAGNOSIS — R42 Dizziness and giddiness: Secondary | ICD-10-CM

## 2012-07-07 DIAGNOSIS — I10 Essential (primary) hypertension: Secondary | ICD-10-CM

## 2012-07-07 DIAGNOSIS — E119 Type 2 diabetes mellitus without complications: Secondary | ICD-10-CM

## 2012-07-07 NOTE — Progress Notes (Signed)
  Subjective:    Patient ID: Brandi Davenport, female    DOB: 01/08/1956, 57 y.o.   MRN: [id number]  HPI  Patient was work today and not feeling well so co-workers took her BP at work and it was 96/65.  She ate a banana and drank OJ and advised patient to see PCP today.  Patient endorses lightheadedness, nausea, and generalized weakness, and headache.  This morning patient took ASA and Lisinopril-HCTZ this morning.  Denies any fevers at home.  + sick contacts - husband just getting over cough, cold symptoms.  Patient says she has been feeling lightheaded and weak for about one month.  Patient has diabetes and has been taking Metformin 500 daily.  She used to take Celexa for depression, but ran out in October and has not been able to afford refills.  She also does not have insurance, but is in process of applying for [facility].  Denies any vomiting, diarrhea.  Denies any LOC or near-syncope.  Normal urine output.  Review of Systems  Per HPI    Objective:   Physical Exam  Constitutional: She appears well-nourished. No distress.  HENT:       Dry mucous membranes  Neck: Normal Davenport of motion. Neck supple.  Cardiovascular: Normal rate and normal heart sounds.   No murmur heard. Pulmonary/Chest: Effort normal and breath sounds normal. She has no wheezes. She has no rales.  Abdominal: Soft. Bowel sounds are normal. She exhibits no distension. There is no tenderness. There is no rebound and no guarding.  Neurological: No cranial nerve deficit.  Skin: Skin is dry.      Assessment & Plan:

## 2012-07-07 NOTE — Patient Instructions (Addendum)
It was good to see you.  I am sorry you are not feeling well. STOP taking blood pressure pill (Lisinopril-HCTZ) for now. DRINK PLENTY OF FLUIDS and get PLENTY OF REST. Return to clinic in 2-4 weeks when you get insurance. If you develop worsening symptoms or loss of consciousness, please report to ER.  Dehydration, Adult Dehydration means your body does not have as much fluid as it needs. Your kidneys, brain, and heart will not work properly without the right amount of fluids and salt.  HOME CARE  Ask your doctor how to replace body fluid losses (rehydrate).  Drink enough fluids to keep your pee (urine) clear or pale yellow.  Drink small amounts of fluids often if you feel sick to your stomach (nauseous) or throw up (vomit).  Eat like you normally do.  Avoid:  Foods or drinks high in sugar.  Bubbly (carbonated) drinks.  Juice.  Very hot or cold fluids.  Drinks with caffeine.  Fatty, greasy foods.  Alcohol.  Tobacco.  Eating too much.  Gelatin desserts.  Wash your hands to avoid spreading germs (bacteria, viruses).  Only take medicine as told by your doctor.  Keep all doctor visits as told. GET HELP RIGHT AWAY IF:   You cannot drink something without throwing up.  You get worse even with treatment.  Your vomit has blood in it or looks greenish.  Your poop (stool) has blood in it or looks black and tarry.  You have not peed in 6 to 8 hours.  You pee a small amount of very dark pee.  You have a fever.  You pass out (faint).  You have belly (abdominal) pain that gets worse or stays in one spot (localizes).  You have a rash, stiff neck, or bad headache.  You get easily annoyed, sleepy, or are hard to wake up.  You feel weak, dizzy, or very thirsty. MAKE SURE YOU:   Understand these instructions.  Will watch your condition.  Will get help right away if you are not doing well or get worse. Document Released: 03/24/2009 Document Revised: 08/20/2011  Document Reviewed: 01/15/2011 [facility] Patient Information 2013 [place], [place].

## 2012-07-09 ENCOUNTER — Encounter: Payer: Self-pay | Admitting: Family Medicine

## 2012-07-09 DIAGNOSIS — R42 Dizziness and giddiness: Secondary | ICD-10-CM | POA: Insufficient documentation

## 2012-07-09 NOTE — Assessment & Plan Note (Signed)
BP low this morning at work but well controlled in clinic today.  She has lost weight (about 8 lbs in 4 months) and may not need to take BP medications.  Will hold for now.  Recheck BP in 2 weeks.

## 2012-07-09 NOTE — Assessment & Plan Note (Addendum)
Lightheadedness etiology could be secondary to low BP/dehydration or possible symptoms of early viral syndrome.  BP reading in clinic was 125/76.  Patient has also been out of Celexa which could be contributing to symptoms.  She has run out of in - Advised patient to hold off on taking Lisinopril-HCTZ for now - Push PO fluids and get plenty of rest if this is a viral illness - Patient needs to apply for our orange card, but she has not had time - Regardless, I recommended close follow up in 2 weeks to make sure symptoms are improving - Red flags reviewed

## 2012-09-17 ENCOUNTER — Encounter: Payer: Self-pay | Admitting: Family Medicine

## 2012-09-17 ENCOUNTER — Ambulatory Visit (INDEPENDENT_AMBULATORY_CARE_PROVIDER_SITE_OTHER): Payer: BC Managed Care – PPO | Admitting: Family Medicine

## 2012-09-17 VITALS — BP 157/72 | HR 89 | Temp 98.8°F | Ht 67.0 in | Wt 231.6 lb

## 2012-09-17 DIAGNOSIS — R51 Headache: Secondary | ICD-10-CM

## 2012-09-17 DIAGNOSIS — E119 Type 2 diabetes mellitus without complications: Secondary | ICD-10-CM

## 2012-09-17 DIAGNOSIS — G4700 Insomnia, unspecified: Secondary | ICD-10-CM | POA: Insufficient documentation

## 2012-09-17 MED ORDER — TRAZODONE HCL 50 MG PO TABS: 50.0000 mg | ORAL_TABLET | Freq: Every evening | ORAL | Status: DC | PRN

## 2012-09-17 NOTE — Assessment & Plan Note (Signed)
Insomnia could be contributing to HA.  She has tried Melatonin which did not help.  Will start Trazodone at bedtime.  Follow up in 4 weeks or sooner as needed.

## 2012-09-17 NOTE — Patient Instructions (Addendum)
Purchase over the counter Tylenol Extra Strength and take every 6-8 hours as needed for headache. Take Trazodone 1 tablet 30 minutes before bedtime. Please call me in about 2 weeks and let me know if headaches are better or worse or the same. If you develop worsening headache with vomiting, blurry vision, one sided weakness, please report to ER.

## 2012-09-17 NOTE — Assessment & Plan Note (Signed)
HA could be due to stress and lack of sleep.  Will treat insomnia with Trazodone 50 mg at bedtime x 4 weeks.  Treat HA with OTC Tylenol ES as needed for pain.  No red flags on exam.  Neuro exam is normal.  Patient to call in 2 weeks to let me know if headache is improving or return to clinic if symptoms worsen.

## 2012-09-17 NOTE — Progress Notes (Signed)
  Subjective:    Patient ID: Brandi Davenport, female    DOB: 1955-08-17, 57 y.o.   MRN: [id number]  HPI  Headaches: this has been an ongoing issue for several months.  Described as throbbing pain, located temporal lobes and radiates to occiput.  Pain is constant, all day long.  She has tried turning off the lights and has tried sleeping, but headache does not resolve.  Denies any nausea or vomiting.  Denies any aura or floaters.  Pain does not worsen with loud noises or bright lights.  Denies any associated neck pain.  She as tried OTC Tylenol, Advil, Motrin, and Aleve - no relief.  She has had difficulty sleeping every night - goes to bed at 10 PM and wakes up at 1 AM and cannot go back to sleep.  Patient does not nap during the day.  She only sleeps for about 3-4 hours per night.  Denies any snoring that she is aware of.  She admits to being under a lot of stress.   Review of Systems Per HPI    Objective:   Physical Exam  Constitutional: She appears well-nourished. No distress.  HENT:  Head: Normocephalic and atraumatic.  Cardiovascular: Normal rate.   Pulmonary/Chest: Effort normal.  Neurological: She is alert. She has normal strength. No cranial nerve deficit or sensory deficit. Gait normal.       Assessment & Plan:

## 2012-09-24 ENCOUNTER — Encounter: Payer: Self-pay | Admitting: *Deleted

## 2012-09-24 DIAGNOSIS — E119 Type 2 diabetes mellitus without complications: Secondary | ICD-10-CM

## 2012-10-02 ENCOUNTER — Encounter: Payer: Self-pay | Admitting: Family Medicine

## 2012-10-02 ENCOUNTER — Ambulatory Visit (INDEPENDENT_AMBULATORY_CARE_PROVIDER_SITE_OTHER): Payer: BC Managed Care – PPO | Admitting: Family Medicine

## 2012-10-02 VITALS — BP 154/96 | HR 83 | Temp 98.2°F | Wt 231.0 lb

## 2012-10-02 DIAGNOSIS — M76899 Other specified enthesopathies of unspecified lower limb, excluding foot: Secondary | ICD-10-CM

## 2012-10-02 DIAGNOSIS — M7062 Trochanteric bursitis, left hip: Secondary | ICD-10-CM | POA: Insufficient documentation

## 2012-10-02 DIAGNOSIS — E119 Type 2 diabetes mellitus without complications: Secondary | ICD-10-CM

## 2012-10-02 DIAGNOSIS — Z299 Encounter for prophylactic measures, unspecified: Secondary | ICD-10-CM

## 2012-10-02 MED ORDER — TRAMADOL HCL 50 MG PO TABS: 50.0000 mg | ORAL_TABLET | Freq: Three times a day (TID) | ORAL | Status: DC | PRN

## 2012-10-02 MED ORDER — METFORMIN HCL 500 MG PO TABS: 500.0000 mg | ORAL_TABLET | Freq: Two times a day (BID) | ORAL | Status: DC

## 2012-10-02 MED ORDER — DICLOFENAC SODIUM 75 MG PO TBEC: 75.0000 mg | DELAYED_RELEASE_TABLET | Freq: Two times a day (BID) | ORAL | Status: DC

## 2012-10-02 MED ORDER — KETOROLAC TROMETHAMINE 60 MG/2ML IM SOLN
60.0000 mg | Freq: Once | INTRAMUSCULAR | Status: AC
  Administered 2012-10-02: 60 mg via INTRAMUSCULAR

## 2012-10-02 NOTE — Assessment & Plan Note (Signed)
Also some extension to IT band on left as well. She is incredibly inflexible. And therefore would have to modify some stretches for her. Please see instructions. I demonstrated and had her teach back to me her stretches. These are hamstring stretches as well as seated crossed leg stretches. Diclofenac daily for relief and inflammation treatment. This was previously on her medication list but she has been out of this for some time and states she is not remember taking this. Tramadol when necessary

## 2012-10-02 NOTE — Assessment & Plan Note (Signed)
I went to the majority of her medications with her as well. I have updated most of her medications. She states she is still not taking her lisinopril. She also is not taking trazodone which was prescribed last time. Her lisinopril was stopped due to hypotension. Recommended she come back in about 2 weeks for blood pressure recheck once the pain is better.

## 2012-10-02 NOTE — Patient Instructions (Signed)
Do the crossed leg stretch 2-3 a day while you're sitting.  Hold for 30 seconds.    Do the toe touches 2-3 times a day as well.    This will help with stiffness.  Use heat or ice on your buttocks and your knee.    Shot today.  Two medicines:  Diclofenac twice a day for 7 - 10 days and then if you need it. Tramadol you take only when it hurts bad.

## 2012-10-02 NOTE — Addendum Note (Signed)
Addended by: Tanna Savoy on: 10/02/2012 12:07 PM   Modules accepted: Orders

## 2012-10-02 NOTE — Progress Notes (Signed)
Subjective:    Brandi Davenport is a 57 y.o. female who presents to [facility] today with complaints of Left hip pain:  1.  Left leg pain:  Started on Monday earlier this week.  No inciting injuries or incidents.  Worse with prolonged standing, better when she sits. She points to her left greater trochanter on aspirin for the pain as. Has been taking Aleve, ibuprofen, Tylenol 1-2 times a day for all of these and has had no relief.  States that pain keeps her up at nighttime. She has to go back to work tomorrow and work through Saturday and Sunday as well. She is a Copy and mops.  Describes as dull ache.  No paresthesias or weakness.     The following portions of the patient's history were reviewed and updated as appropriate: allergies, current medications, past medical history, family and social history, and problem list. Patient is a nonsmoker.    PMH reviewed.  No past medical history on file. No past surgical history on file.  Medications reviewed. Current Outpatient Prescriptions  Medication Sig Dispense Refill  . acetaminophen (TYLENOL) 650 MG CR tablet Take 2 tablets (1,300 mg total) by mouth 2 (two) times daily.  60 tablet  3  . aspirin 81 MG EC tablet Take 81 mg by mouth daily.        . citalopram (CELEXA) 20 MG tablet Take 0.5 tablets (10 mg total) by mouth at bedtime.  90 tablet  5  . cyclobenzaprine (FLEXERIL) 5 MG tablet Take 1 tablet (5 mg total) by mouth 3 (three) times daily as needed for muscle spasms.  30 tablet  0  . diclofenac (VOLTAREN) 75 MG EC tablet Take 1 tablet (75 mg total) by mouth 2 (two) times daily.  30 tablet  0  . ferrous gluconate (FERGON) 324 MG tablet Take 1 tablet (324 mg total) by mouth daily with breakfast.  90 tablet  4  . ferrous sulfate 325 (65 FE) MG EC tablet Take 325 mg by mouth 3 (three) times daily with meals.        Marland Kitchen lisinopril-hydrochlorothiazide (PRINZIDE,ZESTORETIC) 20-12.5 MG per tablet Take 1 tablet by mouth daily. For blood pressure  93  tablet  5  . metFORMIN (GLUCOPHAGE) 500 MG tablet Take 1 tablet (500 mg total) by mouth 2 (two) times daily with a meal.  30 tablet  6  . nitroGLYCERIN (NITROSTAT) 0.4 MG SL tablet Place 1 tablet (0.4 mg total) under the tongue every 5 (five) minutes as needed for chest pain.  100 tablet  3  . omeprazole (PRILOSEC) 20 MG capsule Take 1 capsule (20 mg total) by mouth daily.  90 capsule  6  . pravastatin (PRAVACHOL) 40 MG tablet Take 1 tablet (40 mg total) by mouth at bedtime.  90 tablet  5  . traMADol (ULTRAM) 50 MG tablet Take 1 tablet (50 mg total) by mouth every 8 (eight) hours as needed for pain.  30 tablet  0  . traZODone (DESYREL) 50 MG tablet Take 1 tablet (50 mg total) by mouth at bedtime as needed for sleep.  30 tablet  3   No current facility-administered medications for this visit.    ROS as above otherwise neg.  No chest pain, palpitations, SOB, Fever, Chills, Abd pain, N/V/D.   Objective:   Physical Exam BP 154/96  Pulse 83  Temp(Src) 98.2 F (36.8 C) (Oral)  Wt 231 lb (104.781 kg)  BMI 36.17 kg/m2 Gen:  Alert, cooperative patient who appears stated  age in no acute distress.  Vital signs reviewed. MSK:   Right leg WNL. Point tenderness noted over left greater trochanter. Good internal and external rotation of her hip. Full flexion of knee but does have some pain left lateral aspect of knee with extension and knee both passive and active. She is tender directly over left IT band area of birth the lateral aspect) of left knee. Ober test and Pearlean Brownie tests are positive.  No tenderness rest of buttocks nor back.

## 2012-12-01 ENCOUNTER — Ambulatory Visit (INDEPENDENT_AMBULATORY_CARE_PROVIDER_SITE_OTHER): Payer: BC Managed Care – PPO | Admitting: Family Medicine

## 2012-12-01 ENCOUNTER — Encounter: Payer: Self-pay | Admitting: Family Medicine

## 2012-12-01 VITALS — BP 144/66 | HR 95 | Temp 99.4°F | Ht 67.0 in | Wt 231.0 lb

## 2012-12-01 DIAGNOSIS — R51 Headache: Secondary | ICD-10-CM

## 2012-12-01 MED ORDER — TRAZODONE HCL 50 MG PO TABS: 50.0000 mg | ORAL_TABLET | Freq: Every evening | ORAL | Status: DC | PRN

## 2012-12-01 MED ORDER — TRAMADOL HCL 50 MG PO TABS: 50.0000 mg | ORAL_TABLET | Freq: Three times a day (TID) | ORAL | Status: DC | PRN

## 2012-12-01 NOTE — Progress Notes (Signed)
  Subjective:    Patient ID: Brandi Davenport, female    DOB: Nov 02, 1955, 57 y.o.   MRN: [id number]  HPI  Headache: Pain scale 9/10.  Located bilateral temples.  HA has been chronic, but has been getting worse.  She took Tylenol 4 tablets throughout the day without any relief.  She endorses nausea, but not vomiting.  Bright lights do worsen pain, so she turns them off at home and tries to sleep, but HA is still present when she wakes up.  She endorses fatigue, but no weakness.  For HA, she only takes Tylenol.  She did not pick up Tramadol.  She has had difficulty sleeping every night for several months.  We discussed this at last visit and she never picked up Trazodone.  She only sleeps for about 3-4 hours per night.  She admits to being under a lot of stress because her son was arrested again which causes anxiety at night.  Review of Systems Per HPI    Objective:   Physical Exam  Constitutional: She appears well-nourished. No distress.  HENT:  Head: Normocephalic and atraumatic.  Eyes: Conjunctivae and EOM are normal. Pupils are equal, round, and reactive to light.  Neck: Normal range of motion.  No posterior neck pain or rigidity  Neurological: She has normal strength. No cranial nerve deficit or sensory deficit. Gait normal.      Assessment & Plan:

## 2012-12-01 NOTE — Assessment & Plan Note (Signed)
Patient did not pick up Trazodone, so she continues to endorse insomnia.  Will give Toradol shot today.  Sent Trazodone and Tramadol to pharmacy.  No red flags on exam again today.  Will refer to Headache clinic for co-management.  If HA persists despite better sleeping habits and no stressors, will consider head CT, but will defer to Neurology clinic. Red flags reviewed.  Follow up as needed.

## 2012-12-01 NOTE — Patient Instructions (Addendum)
It was good to see you again.  I am sorry you have a headache. We gave you a shot of pain medication in clinic. Go to the pharmacy and pick up 2 medications: 1) Trazodone take one tablet at bedtime (30 minutes before you want to sleep) as needed for sleep. 2) Tramadol one tablet as needed every 8 hours as needed for headache/pain.  I will also refer you to a Neurologist at the [facility].  Schedule follow up appointment with your new doctor in ONE month or sooner as needed.

## 2012-12-18 ENCOUNTER — Other Ambulatory Visit: Payer: Self-pay

## 2013-01-16 ENCOUNTER — Encounter: Payer: Self-pay | Admitting: Family Medicine

## 2013-01-16 ENCOUNTER — Ambulatory Visit (INDEPENDENT_AMBULATORY_CARE_PROVIDER_SITE_OTHER): Payer: BC Managed Care – PPO | Admitting: Family Medicine

## 2013-01-16 VITALS — BP 136/85 | HR 88 | Temp 99.0°F | Ht 67.0 in | Wt 228.2 lb

## 2013-01-16 DIAGNOSIS — K219 Gastro-esophageal reflux disease without esophagitis: Secondary | ICD-10-CM

## 2013-01-16 DIAGNOSIS — N951 Menopausal and female climacteric states: Secondary | ICD-10-CM

## 2013-01-16 DIAGNOSIS — R232 Flushing: Secondary | ICD-10-CM

## 2013-01-16 DIAGNOSIS — E119 Type 2 diabetes mellitus without complications: Secondary | ICD-10-CM

## 2013-01-16 MED ORDER — OMEPRAZOLE 20 MG PO CPDR: 20.0000 mg | DELAYED_RELEASE_CAPSULE | Freq: Every day | ORAL | Status: DC

## 2013-01-16 MED ORDER — CLONIDINE HCL 0.1 MG PO TABS: 0.1000 mg | ORAL_TABLET | Freq: Two times a day (BID) | ORAL | Status: DC

## 2013-01-16 NOTE — Assessment & Plan Note (Signed)
Continue omeprazole 20 mg daily. Reports history of GERD and no issues when taking medication.

## 2013-01-16 NOTE — Progress Notes (Signed)
Subjective:     Patient ID: Brandi Davenport, female   DOB: [birth year], 57 y.o.   MRN: [id number]  HPI  1. Hot flashes: Began in June. Occurs at night. Lasts for 15-20 minutes. No fever, nausea, chills, tremors. Started on Baclofen and topiramate by her neurologist Dr. Jeannine Boga for headaches and didn't know if these would have any affect. Had a partial hysterectomy in 1986. Only occur at night. Has not taken anything for symptoms. Doesn't know when her mother or aunts started menopause.    2. GERD: History of acid reflux. Ran out of medication and wants a refill. Has no problems when taking medication. Has substernal chest pain after eating things like spaghetti and pinto beans so avoiding those foods. Worse if lying down. Avoids lying down immediately after meals.   3. DIABETES Disease Monitoring: Hgb: A1c 6.5 Blood Sugar ranges-on metformin Polyuria/phagia/dipsia- none     Visual problems- none Medications: metformin 500 mg twice daily  Compliance- yes Hypoglycemic symptoms- none    Review of Systems WNL other than noted above     Objective:   Physical Exam  Gen: NAD, alert, cooperative with exam HEENT: NCAT, no goiter on thyroid exam CV: RRR, good S1/S2, no murmur Resp: CTABL, no wheezes, non-labored Abd: SNTND, BS present, no guarding or organomegaly Ext: No edema, warm Neuro: Alert and oriented, No gross deficits   Assessment:     1. Menopause: 2 month history   2. GERD: well controlled with medication  3. T2DM: well controlled. Hgb A1c trend 7.3-->6.9-->6.5     Plan:

## 2013-01-16 NOTE — Patient Instructions (Signed)
Nice to meet you,   Congratulation on your weight loss and keeping your diabetes in check. You're doing a great job. I want you to keep doing what you're doing, I wish every patient was like you.   I will give you clonidine for your hot flashes. This is taken twice a day. We will start here and if your symptoms don't improve in a month, return and we will try something else.   I will refill your omeprazole for your acid reflux.   Thank you and keep up the good work.

## 2013-01-16 NOTE — Assessment & Plan Note (Signed)
A1C: 6.5 down from 6.9. Continue current medication regimen. Continue to lose weight. Encouraged patient to maintain enthusiasm for weight loss. Follow up in 3 months

## 2013-01-16 NOTE — Assessment & Plan Note (Signed)
Hot flashes for 2 months. Begin month trial of clonidine .1 mg BID. F/u in 1 month if symptoms persist. Consider hormone therapy if clonidine fails.

## 2013-03-12 ENCOUNTER — Ambulatory Visit (INDEPENDENT_AMBULATORY_CARE_PROVIDER_SITE_OTHER): Payer: BC Managed Care – PPO | Admitting: *Deleted

## 2013-03-12 DIAGNOSIS — Z23 Encounter for immunization: Secondary | ICD-10-CM

## 2013-04-16 ENCOUNTER — Ambulatory Visit (INDEPENDENT_AMBULATORY_CARE_PROVIDER_SITE_OTHER): Payer: BC Managed Care – PPO | Admitting: Family Medicine

## 2013-04-16 ENCOUNTER — Encounter: Payer: Self-pay | Admitting: Family Medicine

## 2013-04-16 VITALS — BP 147/80 | HR 84 | Temp 98.1°F | Ht 67.0 in | Wt 230.0 lb

## 2013-04-16 DIAGNOSIS — J329 Chronic sinusitis, unspecified: Secondary | ICD-10-CM | POA: Insufficient documentation

## 2013-04-16 MED ORDER — AMOXICILLIN 500 MG PO CAPS: 500.0000 mg | ORAL_CAPSULE | Freq: Two times a day (BID) | ORAL | Status: DC

## 2013-04-16 NOTE — Progress Notes (Signed)
Family Medicine Office Visit Note   Subjective:   Patient ID: Brandi Davenport, female  DOB: 1955/10/06, 57 y.o.. MRN: [id number]   Pt that comes today for same day appointment complaining of sinus congestion and frontal headache. She has been sick for 2 weeks initially with fever up to 101 for one day, then she continued with nasal drainage, postnasal dripping and dry cough. She reports has been taking Tylenol and guifenesin and this seems to help. Reports decrease in olfactory sensation. Denies nausea, vomiting, chills, shortness of breath, chest pain, or other systemic symptoms.  Review of Systems:  Per HPI Objective:   Physical Exam: Gen:  Obese. NAD HEENT: Moist mucous membranes. Tenderness to palpation of frontal sinuses as well as decrease in transillumination of her frontal sinuses and left maxillary sinus. Clear nasal discharge. Ears are with normal TM bilaterally.  CV: Regular rate and rhythm, no murmurs PULM: Clear to auscultation bilaterally. No wheezes/rales/rhonch ABD: Soft, non tender, non distended, normal bowel sounds EXT: No edema Neuro: Alert and oriented x3. No focalization  Assessment & Plan:

## 2013-04-16 NOTE — Assessment & Plan Note (Signed)
Acute rhinosinusitis. Will treat with antibiotic and continue guifenesin Discussed signs of worsening condition that should prompt re-evaluation. Followup as needed.

## 2013-04-16 NOTE — Patient Instructions (Signed)

## 2013-06-15 ENCOUNTER — Ambulatory Visit (INDEPENDENT_AMBULATORY_CARE_PROVIDER_SITE_OTHER): Payer: No Typology Code available for payment source | Admitting: Sports Medicine

## 2013-06-15 ENCOUNTER — Encounter: Payer: Self-pay | Admitting: Sports Medicine

## 2013-06-15 VITALS — BP 148/86 | HR 88 | Ht 67.0 in | Wt 235.2 lb

## 2013-06-15 DIAGNOSIS — H8110 Benign paroxysmal vertigo, unspecified ear: Secondary | ICD-10-CM

## 2013-06-15 DIAGNOSIS — E119 Type 2 diabetes mellitus without complications: Secondary | ICD-10-CM

## 2013-06-15 DIAGNOSIS — Z299 Encounter for prophylactic measures, unspecified: Secondary | ICD-10-CM

## 2013-06-15 LAB — POCT GLYCOSYLATED HEMOGLOBIN (HGB A1C): Hemoglobin A1C: 7.1

## 2013-06-15 MED ORDER — MECLIZINE HCL 32 MG PO TABS: 32.0000 mg | ORAL_TABLET | Freq: Three times a day (TID) | ORAL | Status: DC | PRN

## 2013-06-15 NOTE — Assessment & Plan Note (Addendum)
Trial Meclizine Consider ENT referral/PT

## 2013-06-15 NOTE — Progress Notes (Signed)
  Brandi Davenport - 58 y.o. female MRN [id number]  Date of birth: July 13, 1955  CC, HPI, INTERVAL HISTORY & ROS  Brandi Davenport is here today for an acute visit for dizziness    She reports vertiginous like symptoms that occur intermittently and worse with positional changes.  She denies nausea or vomiting with these episodes.  Patient denies any facial asymmetry, unilateral weakness, or dysarthria.  Pt denies chest pain, dyspnea at rest or exertion, PND, lower extremity edema.  Of note she has been off all of her medications except aspirin daily.  In spite of having active prescriptions  History  Past Medical, Surgical, Social, and Family History Reviewed per EMR Medications and Allergies reviewed and all updated if necessary. Objective Findings  VITALS: HR: 88 bpm  BP: 148/86 mmHg  TEMP:   ( )  RESP:    HT: 5\' 7"  (170.2 cm)  WT: 235 lb 3.2 oz (106.686 kg)  BMI: 36.9   BP Readings from Last 3 Encounters:  06/15/13 148/86  04/16/13 147/80  01/16/13 136/85   Wt Readings from Last 3 Encounters:  06/15/13 235 lb 3.2 oz (106.686 kg)  04/16/13 230 lb (104.327 kg)  01/16/13 228 lb 3.2 oz (103.511 kg)     PHYSICAL EXAM: GENERAL:  adult [place]. In no discomfort; no respiratory distress  PSYCH: alert and appropriate,  HNEENT:  extraocular muscles intact, no nystagmus, no facial droop or asymmetry. Speech is fluent.   Symptomatically Positive Epley test with lying to the right; worsened with sitting but no nystagmus noted   CARDIO: RRR, S1/S2 heard, no murmur  LUNGS: CTA B, no wheezes, no crackles  ABDOMEN:   EXTREM:  Warm, well perfused.  Moves all 4 extremities spontaneously; no lateralization.   GU:   SKIN:     Assessment & Plan   Problems addressed today: General Plan & Pt Instructions:  1. DIABETES MELLITUS II, UNCOMPLICATED   2. Benign paroxysmal positional vertigo   3. Preventive Medicine       Start Meclizine  Follow up with Dr. Jordan LikesSchmitz        For further discussion of A/P and for follow up issues see problem based charting if applicable.

## 2013-06-15 NOTE — Patient Instructions (Signed)
   Start Meclizine  Follow up with Dr. Jordan LikesSchmitz    If you need anything prior to your next visit please call the clinic. Please Bring all medications or accurate medication list with you to each appointment; an accurate medication list is essential in providing you the best care possible.

## 2013-06-23 NOTE — Assessment & Plan Note (Signed)
Patient encouraged to refill all of her medications and to followup with her PCP to further discuss chronic ongoing medical care.

## 2013-06-23 NOTE — Assessment & Plan Note (Signed)
A1c worsened.  Encouraged to refill medicines and strict diet adherence.

## 2013-07-09 ENCOUNTER — Encounter: Payer: Self-pay | Admitting: Family Medicine

## 2013-07-09 ENCOUNTER — Ambulatory Visit (INDEPENDENT_AMBULATORY_CARE_PROVIDER_SITE_OTHER): Payer: BC Managed Care – PPO | Admitting: Family Medicine

## 2013-07-09 VITALS — BP 164/97 | HR 77 | Temp 98.7°F | Resp 18 | Wt 238.0 lb

## 2013-07-09 DIAGNOSIS — M549 Dorsalgia, unspecified: Secondary | ICD-10-CM

## 2013-07-09 DIAGNOSIS — M545 Low back pain, unspecified: Secondary | ICD-10-CM

## 2013-07-09 LAB — POCT URINALYSIS DIPSTICK
Bilirubin, UA: NEGATIVE
Glucose, UA: NEGATIVE
KETONES UA: NEGATIVE
Leukocytes, UA: NEGATIVE
Nitrite, UA: NEGATIVE
PH UA: 6
Protein, UA: NEGATIVE
RBC UA: NEGATIVE
Urobilinogen, UA: 0.2

## 2013-07-09 LAB — BASIC METABOLIC PANEL
BUN: 13 mg/dL (ref 6–23)
CALCIUM: 9.4 mg/dL (ref 8.4–10.5)
CO2: 30 meq/L (ref 19–32)
CREATININE: 0.65 mg/dL (ref 0.50–1.10)
Chloride: 102 mEq/L (ref 96–112)
GLUCOSE: 130 mg/dL — AB (ref 70–99)
Potassium: 4.2 mEq/L (ref 3.5–5.3)
Sodium: 137 mEq/L (ref 135–145)

## 2013-07-09 MED ORDER — CYCLOBENZAPRINE HCL 10 MG PO TABS: 10.0000 mg | ORAL_TABLET | Freq: Three times a day (TID) | ORAL | Status: DC | PRN

## 2013-07-09 MED ORDER — IBUPROFEN 600 MG PO TABS: 600.0000 mg | ORAL_TABLET | Freq: Four times a day (QID) | ORAL | Status: DC | PRN

## 2013-07-09 NOTE — Assessment & Plan Note (Addendum)
Likely from muscle sprain from lifting heavy mattress at work yesterday. No radicular or neurologic symptoms or deficits.  - treat with flexeril and ibuprofen 600mg  every 6 hrs.  - would have given toradol but do not have a very recent Cr (latest was in 2013). Will therefore check BMP and confirm normal kidney function. If abnormal, will call patient and switch her from ibuprofen to something else.  - no imaging indicated at this time - red flags for return reviewed.

## 2013-07-09 NOTE — Progress Notes (Signed)
Patient ID: Brandi GallusSherline Davenport    DOB: 1955/07/16, 58 y.o.   MRN: [id number] --- Subjective:  Brandi QuietSherline is a 58 y.o.female with h/o hypertension, diabetes who presents for evaluation of low back pain. Pain started acutely yesterday after she moved a heavy mattress at her work. She works as a Advertising copywriterhousekeeper. She immediately felt pulling sharp pain in her lower back bilaterally. Pain did not radiate down her legs. She denies any weakness in her lower extremities. She denies any urine or bowel incontinence. Pain is better when she sits. It is worse when she walks or lays down. Pain is constant, sharp. She tried using ice packs which did not help. She has otherwise not tried anything else. This has never happened to her before.  ROS: see HPI Past Medical History: reviewed and updated medications and allergies. Social History: Tobacco: None  Objective: Filed Vitals:   07/09/13 0917  BP: 164/97  Pulse: 77  Temp: 98.7 F (37.1 C)  Resp: 18    Physical Examination:   General appearance - alert, well appearing, and in no distress Back - no tenderness along the cervical thoracic or lumbar spine. Tenderness along paralumbar muscles bilaterally, increased spasticity noted.  Pain with flexion and extension.  5/5 strength with knee flexion, and extension, plantar flexion dorsi flexion of the foot bilaterally. Trace patellar reflex bilaterally. Negative straight leg bilaterally Normal external and internal rotation of hip bilaterally

## 2013-07-09 NOTE — Patient Instructions (Signed)
You pulled a muscle in your lower back which is what is causing pain.  We are going to treat this with a muscle relaxant and a non steroidal inflammatory medicine: ibuprofen 600mg  .  Take the ibuprofen with food.  If the pain doesn't resolve in the next 2 weeks, please return to care.    Low Back Sprain with Rehab  A sprain is an injury in which a ligament is torn. The ligaments of the lower back are vulnerable to sprains. However, they are strong and require great force to be injured. These ligaments are important for stabilizing the spinal column. Sprains are classified into three categories. Grade 1 sprains cause pain, but the tendon is not lengthened. Grade 2 sprains include a lengthened ligament, due to the ligament being stretched or partially ruptured. With grade 2 sprains there is still function, although the function may be decreased. Grade 3 sprains involve a complete tear of the tendon or muscle, and function is usually impaired. SYMPTOMS   Severe pain in the lower back.  Sometimes, a feeling of a "pop," "snap," or tear, at the time of injury.  Tenderness and sometimes swelling at the injury site.  Uncommonly, bruising (contusion) within 48 hours of injury.  Muscle spasms in the back. CAUSES  Low back sprains occur when a force is placed on the ligaments that is greater than they can handle. Common causes of injury include:  Performing a stressful act while off-balance.  Repetitive stressful activities that involve movement of the lower back.  Direct hit (trauma) to the lower back. RISK INCREASES WITH:  Contact sports (football, wrestling).  Collisions (major skiing accidents).  Sports that require throwing or lifting (baseball, weightlifting).  Sports involving twisting of the spine (gymnastics, diving, tennis, golf).  Poor strength and flexibility.  Inadequate protection.  Previous back injury or surgery (especially fusion). PREVENTION  Wear properly fitted  and padded protective equipment.  Warm up and stretch properly before activity.  Allow for adequate recovery between workouts.  Maintain physical fitness:  Strength, flexibility, and endurance.  Cardiovascular fitness.  Maintain a healthy body weight. PROGNOSIS  If treated properly, low back sprains usually heal with non-surgical treatment. The length of time for healing depends on the severity of the injury.  RELATED COMPLICATIONS   Recurring symptoms, resulting in a chronic problem.  Chronic inflammation and pain in the low back.  Delayed healing or resolution of symptoms, especially if activity is resumed too soon.  Prolonged impairment.  Unstable or arthritic joints of the low back. TREATMENT  Treatment first involves the use of ice and medicine, to reduce pain and inflammation. The use of strengthening and stretching exercises may help reduce pain with activity. These exercises may be performed at home or with a therapist. Severe injuries may require referral to a therapist for further evaluation and treatment, such as ultrasound. Your caregiver may advise that you wear a back brace or corset, to help reduce pain and discomfort. Often, prolonged bed rest results in greater harm then benefit. Corticosteroid injections may be recommended. However, these should be reserved for the most serious cases. It is important to avoid using your back when lifting objects. At night, sleep on your back on a firm mattress, with a pillow placed under your knees. If non-surgical treatment is unsuccessful, surgery may be needed.  MEDICATION   If pain medicine is needed, nonsteroidal anti-inflammatory medicines (aspirin and ibuprofen), or other minor pain relievers (acetaminophen), are often advised.  Do not take pain medicine  for 7 days before surgery.  Prescription pain relievers may be given, if your caregiver thinks they are needed. Use only as directed and only as much as you  need.  Ointments applied to the skin may be helpful.  Corticosteroid injections may be given by your caregiver. These injections should be reserved for the most serious cases, because they may only be given a certain number of times. HEAT AND COLD  Cold treatment (icing) should be applied for 10 to 15 minutes every 2 to 3 hours for inflammation and pain, and immediately after activity that aggravates your symptoms. Use ice packs or an ice massage.  Heat treatment may be used before performing stretching and strengthening activities prescribed by your caregiver, physical therapist, or athletic trainer. Use a heat pack or a warm water soak. SEEK MEDICAL CARE IF:   Symptoms get worse or do not improve in 2 to 4 weeks, despite treatment.  You develop numbness or weakness in either leg.  You lose bowel or bladder function.  Any of the following occur after surgery: fever, increased pain, swelling, redness, drainage of fluids, or bleeding in the affected area.  New, unexplained symptoms develop. (Drugs used in treatment may produce side effects.) EXERCISES  RANGE OF MOTION (ROM) AND STRETCHING EXERCISES - Low Back Sprain Most people with lower back pain will find that their symptoms get worse with excessive bending forward (flexion) or arching at the lower back (extension). The exercises that will help resolve your symptoms will focus on the opposite motion.  Your physician, physical therapist or athletic trainer will help you determine which exercises will be most helpful to resolve your lower back pain. Do not complete any exercises without first consulting with your caregiver. Discontinue any exercises which make your symptoms worse, until you speak to your caregiver. If you have pain, numbness or tingling which travels down into your buttocks, leg or foot, the goal of the therapy is for these symptoms to move closer to your back and eventually resolve. Sometimes, these leg symptoms will get  better, but your lower back pain may worsen. This is often an indication of progress in your rehabilitation. Be very alert to any changes in your symptoms and the activities in which you participated in the 24 hours prior to the change. Sharing this information with your caregiver will allow him or her to most efficiently treat your condition. These exercises may help you when beginning to rehabilitate your injury. Your symptoms may resolve with or without further involvement from your physician, physical therapist or athletic trainer. While completing these exercises, remember:   Restoring tissue flexibility helps normal motion to return to the joints. This allows healthier, less painful movement and activity.  An effective stretch should be held for at least 30 seconds.  A stretch should never be painful. You should only feel a gentle lengthening or release in the stretched tissue. FLEXION RANGE OF MOTION AND STRETCHING EXERCISES: STRETCH  Flexion, Single Knee to Chest   Lie on a firm bed or floor with both legs extended in front of you.  Keeping one leg in contact with the floor, bring your opposite knee to your chest. Hold your leg in place by either grabbing behind your thigh or at your knee.  Pull until you feel a gentle stretch in your low back. Hold __________ seconds.  Slowly release your grasp and repeat the exercise with the opposite side. Repeat __________ times. Complete this exercise __________ times per day.  STRETCH  Flexion, Double Knee to Chest  Lie on a firm bed or floor with both legs extended in front of you.  Keeping one leg in contact with the floor, bring your opposite knee to your chest.  Tense your stomach muscles to support your back and then lift your other knee to your chest. Hold your legs in place by either grabbing behind your thighs or at your knees.  Pull both knees toward your chest until you feel a gentle stretch in your low back. Hold __________  seconds.  Tense your stomach muscles and slowly return one leg at a time to the floor. Repeat __________ times. Complete this exercise __________ times per day.  STRETCH  Low Trunk Rotation  Lie on a firm bed or floor. Keeping your legs in front of you, bend your knees so they are both pointed toward the ceiling and your feet are flat on the floor.  Extend your arms out to the side. This will stabilize your upper body by keeping your shoulders in contact with the floor.  Gently and slowly drop both knees together to one side until you feel a gentle stretch in your low back. Hold for __________ seconds.  Tense your stomach muscles to support your lower back as you bring your knees back to the starting position. Repeat the exercise to the other side. Repeat __________ times. Complete this exercise __________ times per day  EXTENSION RANGE OF MOTION AND FLEXIBILITY EXERCISES: STRETCH  Extension, Prone on Elbows   Lie on your stomach on the floor, a bed will be too soft. Place your palms about shoulder width apart and at the height of your head.  Place your elbows under your shoulders. If this is too painful, stack pillows under your chest.  Allow your body to relax so that your hips drop lower and make contact more completely with the floor.  Hold this position for __________ seconds.  Slowly return to lying flat on the floor. Repeat __________ times. Complete this exercise __________ times per day.  RANGE OF MOTION  Extension, Prone Press Ups  Lie on your stomach on the floor, a bed will be too soft. Place your palms about shoulder width apart and at the height of your head.  Keeping your back as relaxed as possible, slowly straighten your elbows while keeping your hips on the floor. You may adjust the placement of your hands to maximize your comfort. As you gain motion, your hands will come more underneath your shoulders.  Hold this position __________ seconds.  Slowly return to  lying flat on the floor. Repeat __________ times. Complete this exercise __________ times per day.  RANGE OF MOTION- Quadruped, Neutral Spine   Assume a hands and knees position on a firm surface. Keep your hands under your shoulders and your knees under your hips. You may place padding under your knees for comfort.  Drop your head and point your tailbone toward the ground below you. This will round out your lower back like an angry cat. Hold this position for __________ seconds.  Slowly lift your head and release your tail bone so that your back sags into a large arch, like an old horse.  Hold this position for __________ seconds.  Repeat this until you feel limber in your low back.  Now, find your "sweet spot." This will be the most comfortable position somewhere between the two previous positions. This is your neutral spine. Once you have found this position, tense your stomach muscles to  support your low back.  Hold this position for __________ seconds. Repeat __________ times. Complete this exercise __________ times per day.  STRENGTHENING EXERCISES - Low Back Sprain These exercises may help you when beginning to rehabilitate your injury. These exercises should be done near your "sweet spot." This is the neutral, low-back arch, somewhere between fully rounded and fully arched, that is your least painful position. When performed in this safe range of motion, these exercises can be used for people who have either a flexion or extension based injury. These exercises may resolve your symptoms with or without further involvement from your physician, physical therapist or athletic trainer. While completing these exercises, remember:   Muscles can gain both the endurance and the strength needed for everyday activities through controlled exercises.  Complete these exercises as instructed by your physician, physical therapist or athletic trainer. Increase the resistance and repetitions only as  guided.  You may experience muscle soreness or fatigue, but the pain or discomfort you are trying to eliminate should never worsen during these exercises. If this pain does worsen, stop and make certain you are following the directions exactly. If the pain is still present after adjustments, discontinue the exercise until you can discuss the trouble with your caregiver. STRENGTHENING Deep Abdominals, Pelvic Tilt   Lie on a firm bed or floor. Keeping your legs in front of you, bend your knees so they are both pointed toward the ceiling and your feet are flat on the floor.  Tense your lower abdominal muscles to press your low back into the floor. This motion will rotate your pelvis so that your tail bone is scooping upwards rather than pointing at your feet or into the floor. With a gentle tension and even breathing, hold this position for __________ seconds. Repeat __________ times. Complete this exercise __________ times per day.  STRENGTHENING  Abdominals, Crunches   Lie on a firm bed or floor. Keeping your legs in front of you, bend your knees so they are both pointed toward the ceiling and your feet are flat on the floor. Cross your arms over your chest.  Slightly tip your chin down without bending your neck.  Tense your abdominals and slowly lift your trunk high enough to just clear your shoulder blades. Lifting higher can put excessive stress on the lower back and does not further strengthen your abdominal muscles.  Control your return to the starting position. Repeat __________ times. Complete this exercise __________ times per day.  STRENGTHENING  Quadruped, Opposite UE/LE Lift   Assume a hands and knees position on a firm surface. Keep your hands under your shoulders and your knees under your hips. You may place padding under your knees for comfort.  Find your neutral spine and gently tense your abdominal muscles so that you can maintain this position. Your shoulders and hips should  form a rectangle that is parallel with the floor and is not twisted.  Keeping your trunk steady, lift your right hand no higher than your shoulder and then your left leg no higher than your hip. Make sure you are not holding your breath. Hold this position for __________ seconds.  Continuing to keep your abdominal muscles tense and your back steady, slowly return to your starting position. Repeat with the opposite arm and leg. Repeat __________ times. Complete this exercise __________ times per day.  STRENGTHENING  Abdominals and Quadriceps, Straight Leg Raise   Lie on a firm bed or floor with both legs extended in front of  you.  Keeping one leg in contact with the floor, bend the other knee so that your foot can rest flat on the floor.  Find your neutral spine, and tense your abdominal muscles to maintain your spinal position throughout the exercise.  Slowly lift your straight leg off the floor about 6 inches for a count of 15, making sure to not hold your breath.  Still keeping your neutral spine, slowly lower your leg all the way to the floor. Repeat this exercise with each leg __________ times. Complete this exercise __________ times per day. POSTURE AND BODY MECHANICS CONSIDERATIONS - Low Back Sprain Keeping correct posture when sitting, standing or completing your activities will reduce the stress put on different body tissues, allowing injured tissues a chance to heal and limiting painful experiences. The following are general guidelines for improved posture. Your physician or physical therapist will provide you with any instructions specific to your needs. While reading these guidelines, remember:  The exercises prescribed by your provider will help you have the flexibility and strength to maintain correct postures.  The correct posture provides the best environment for your joints to work. All of your joints have less wear and tear when properly supported by a spine with good posture.  This means you will experience a healthier, less painful body.  Correct posture must be practiced with all of your activities, especially prolonged sitting and standing. Correct posture is as important when doing repetitive low-stress activities (typing) as it is when doing a single heavy-load activity (lifting). RESTING POSITIONS Consider which positions are most painful for you when choosing a resting position. If you have pain with flexion-based activities (sitting, bending, stooping, squatting), choose a position that allows you to rest in a less flexed posture. You would want to avoid curling into a fetal position on your side. If your pain worsens with extension-based activities (prolonged standing, working overhead), avoid resting in an extended position such as sleeping on your stomach. Most people will find more comfort when they rest with their spine in a more neutral position, neither too rounded nor too arched. Lying on a non-sagging bed on your side with a pillow between your knees, or on your back with a pillow under your knees will often provide some relief. Keep in mind, being in any one position for a prolonged period of time, no matter how correct your posture, can still lead to stiffness. PROPER SITTING POSTURE In order to minimize stress and discomfort on your spine, you must sit with correct posture. Sitting with good posture should be effortless for a healthy body. Returning to good posture is a gradual process. Many people can work toward this most comfortably by using various supports until they have the flexibility and strength to maintain this posture on their own. When sitting with proper posture, your ears will fall over your shoulders and your shoulders will fall over your hips. You should use the back of the chair to support your upper back. Your lower back will be in a neutral position, just slightly arched. You may place a small pillow or folded towel at the base of your lower  back for  support.  When working at a desk, create an environment that supports good, upright posture. Without extra support, muscles tire, which leads to excessive strain on joints and other tissues. Keep these recommendations in mind: CHAIR:  A chair should be able to slide under your desk when your back makes contact with the back of the chair.  This allows you to work closely.  The chair's height should allow your eyes to be level with the upper part of your monitor and your hands to be slightly lower than your elbows. BODY POSITION  Your feet should make contact with the floor. If this is not possible, use a foot rest.  Keep your ears over your shoulders. This will reduce stress on your neck and low back. INCORRECT SITTING POSTURES  If you are feeling tired and unable to assume a healthy sitting posture, do not slouch or slump. This puts excessive strain on your back tissues, causing more damage and pain. Healthier options include:  Using more support, like a lumbar pillow.  Switching tasks to something that requires you to be upright or walking.  Talking a brief walk.  Lying down to rest in a neutral-spine position. PROLONGED STANDING WHILE SLIGHTLY LEANING FORWARD  When completing a task that requires you to lean forward while standing in one place for a long time, place either foot up on a stationary 2-4 inch high object to help maintain the best posture. When both feet are on the ground, the lower back tends to lose its slight inward curve. If this curve flattens (or becomes too large), then the back and your other joints will experience too much stress, tire more quickly, and can cause pain. CORRECT STANDING POSTURES Proper standing posture should be assumed with all daily activities, even if they only take a few moments, like when brushing your teeth. As in sitting, your ears should fall over your shoulders and your shoulders should fall over your hips. You should keep a slight  tension in your abdominal muscles to brace your spine. Your tailbone should point down to the ground, not behind your body, resulting in an over-extended swayback posture.  INCORRECT STANDING POSTURES  Common incorrect standing postures include a forward head, locked knees and/or an excessive swayback. WALKING Walk with an upright posture. Your ears, shoulders and hips should all line-up. PROLONGED ACTIVITY IN A FLEXED POSITION When completing a task that requires you to bend forward at your waist or lean over a low surface, try to find a way to stabilize 3 out of 4 of your limbs. You can place a hand or elbow on your thigh or rest a knee on the surface you are reaching across. This will provide you more stability, so that your muscles do not tire as quickly. By keeping your knees relaxed, or slightly bent, you will also reduce stress across your lower back. CORRECT LIFTING TECHNIQUES DO :  Assume a wide stance. This will provide you more stability and the opportunity to get as close as possible to the object which you are lifting.  Tense your abdominals to brace your spine. Bend at the knees and hips. Keeping your back locked in a neutral-spine position, lift using your leg muscles. Lift with your legs, keeping your back straight.  Test the weight of unknown objects before attempting to lift them.  Try to keep your elbows locked down at your sides in order get the best strength from your shoulders when carrying an object.  Always ask for help when lifting heavy or awkward objects. INCORRECT LIFTING TECHNIQUES DO NOT:   Lock your knees when lifting, even if it is a small object.  Bend and twist. Pivot at your feet or move your feet when needing to change directions.  Assume that you can safely pick up even a paperclip without proper posture. Document Released: 05/28/2005  Document Revised: 08/20/2011 Document Reviewed: 09/09/2008 [facility] Patient Information 2014 [place], [place].

## 2013-07-15 ENCOUNTER — Ambulatory Visit (INDEPENDENT_AMBULATORY_CARE_PROVIDER_SITE_OTHER): Payer: BC Managed Care – PPO | Admitting: Family Medicine

## 2013-07-15 VITALS — BP 140/60 | Temp 98.6°F

## 2013-07-15 DIAGNOSIS — J069 Acute upper respiratory infection, unspecified: Secondary | ICD-10-CM

## 2013-07-15 NOTE — Patient Instructions (Signed)
Keep taking the prescription strength ibuprofen.  Once we get you opened up, it should help your headache. Get some Afrin Nasal spray - an over the counter medicine - and use twice a day for three days to get your nose opened up. You must stop using the afrin after 3 days. If you are still sick next Monday, that would be 7 days and we would probably give an antibiotic then.  Right now, antibiotics would not help.

## 2013-07-16 ENCOUNTER — Encounter: Payer: Self-pay | Admitting: Family Medicine

## 2013-07-16 DIAGNOSIS — J069 Acute upper respiratory infection, unspecified: Secondary | ICD-10-CM | POA: Insufficient documentation

## 2013-07-16 NOTE — Progress Notes (Signed)
   Subjective:    Patient ID: Brandi GallusSherline Davenport, female    DOB: 1956/02/12, 58 y.o.   MRN: [id number]  HPI C/O URI symptoms for 2 days.  Nasal congestion, low grad fever, sore throat.  No cough or SOB.  Was treated for sinusitis in Nov - but she had been sick for 2 weeks with that treatment.    Review of Systems     Objective:   Physical ExamTMs normal Throat mild injection Nasal mucosa boggy Neck minimal ant cerivical lymph nodes.  Lungs clear.        Assessment & Plan:

## 2013-07-16 NOTE — Assessment & Plan Note (Signed)
No evidence of bacterial infection, presumed viral.  Symptomatic care.

## 2013-08-27 ENCOUNTER — Encounter: Payer: Self-pay | Admitting: Family Medicine

## 2013-08-27 ENCOUNTER — Ambulatory Visit (INDEPENDENT_AMBULATORY_CARE_PROVIDER_SITE_OTHER): Payer: No Typology Code available for payment source | Admitting: Family Medicine

## 2013-08-27 VITALS — BP 147/88 | HR 88 | Temp 98.5°F | Ht 67.0 in | Wt 238.1 lb

## 2013-08-27 DIAGNOSIS — Z23 Encounter for immunization: Secondary | ICD-10-CM

## 2013-08-27 DIAGNOSIS — E1165 Type 2 diabetes mellitus with hyperglycemia: Secondary | ICD-10-CM

## 2013-08-27 DIAGNOSIS — E119 Type 2 diabetes mellitus without complications: Secondary | ICD-10-CM

## 2013-08-27 DIAGNOSIS — IMO0002 Reserved for concepts with insufficient information to code with codable children: Secondary | ICD-10-CM

## 2013-08-27 DIAGNOSIS — E785 Hyperlipidemia, unspecified: Secondary | ICD-10-CM

## 2013-08-27 DIAGNOSIS — I10 Essential (primary) hypertension: Secondary | ICD-10-CM

## 2013-08-27 DIAGNOSIS — E118 Type 2 diabetes mellitus with unspecified complications: Secondary | ICD-10-CM

## 2013-08-27 LAB — LIPID PANEL
CHOLESTEROL: 188 mg/dL (ref 0–200)
HDL: 51 mg/dL (ref >39)
LDL Cholesterol: 105 mg/dL — ABNORMAL HIGH (ref 0–99)
Total CHOL/HDL Ratio: 3.7 Ratio
Triglycerides: 160 mg/dL — ABNORMAL HIGH (ref <150)
VLDL: 32 mg/dL (ref 0–40)

## 2013-08-27 MED ORDER — METFORMIN HCL ER 500 MG PO TB24: 500.0000 mg | ORAL_TABLET | Freq: Every day | ORAL | Status: DC

## 2013-08-27 MED ORDER — LISINOPRIL 5 MG PO TABS: 5.0000 mg | ORAL_TABLET | Freq: Every day | ORAL | Status: DC

## 2013-08-27 NOTE — Assessment & Plan Note (Signed)
Currently no therapy. She reports being told to stop. No indications that she has an intolerance.  - lipid panel today  - will call with the results

## 2013-08-27 NOTE — Assessment & Plan Note (Signed)
BP 147/88. She is not currently following any form of a diet. She reports getting plenty of exercise.  - started lisinopril 5 mg daily. BMP in January showed normal K and good kidney function.  - will need BMP at f/u to look at K and kidney function.  - f/u in 3 months

## 2013-08-27 NOTE — Patient Instructions (Signed)
Thank you for coming in,   I will start a low dose hypertension medication called lisinopril.   We are starting metformin 500 mg extended release for your diabetes. Your blood sugars are not that bad. With diet and exercise we may be able to decrease the amount of medicine that you have to take.   I will check your cholesterol today and call you with the results.   Please follow up with me in two months.    Please feel free to call with any questions or concerns at any time, at [phone]. --Dr. Jordan LikesSchmitz  Diet Recommendations for Diabetes   Starchy (carb) foods include: Bread, rice, pasta, potatoes, corn, crackers, bagels, muffins, all baked goods.   Protein foods include: Meat, fish, poultry, eggs, dairy foods, and beans such as pinto and kidney beans (beans also provide carbohydrate).   1. Eat at least 3 meals and 1-2 snacks per day. Never go more than 4-5 hours while awake without eating.  2. Limit starchy foods to TWO per meal and ONE per snack. ONE portion of a starchy  food is equal to the following:   - ONE slice of bread (or its equivalent, such as half of a hamburger bun).   - 1/2 cup of a "scoopable" starchy food such as potatoes or rice.   - 1 OUNCE (28 grams) of starchy snack foods such as crackers or pretzels (look on label).   - 15 grams of carbohydrate as shown on food label.  3. Both lunch and dinner should include a protein food, a carb food, and vegetables.   - Obtain twice as many veg's as protein or carbohydrate foods for both lunch and dinner.   - Try to keep frozen veg's on hand for a quick vegetable serving.     - Fresh or frozen veg's are best.  4. Breakfast should always include protein.

## 2013-08-27 NOTE — Assessment & Plan Note (Signed)
Hgb A1c 7.1 and trending up. She has not taken her medication since Oct 2014. She stopped bc it was causing too much GI upset. She is willing to start metformin again and is not interested in starting a medication that she will need to check her blood sugars. Fatigue could be assocaited with her worsening DM and also only eating one meal a day. She also reports to eating a bunch of sugary snacks.  - Metformin 500 mg XR daily.  - Hgb A1c check in two months.  - f/u in two months.

## 2013-08-27 NOTE — Progress Notes (Signed)
Subjective:     Patient ID: Brandi Davenport, female   DOB: [birth year], 58 y.o.   MRN: [id number]  HPI   Brandi Davenport is here for an annual physical exam.  CHRONIC DIABETES: Has been increased tired for the last two months. Tired throughout   Disease Monitoring: not currently taking any medications.   Blood Sugar Ranges: no checking   Polyuria: no   Visual problems: no   Medication Compliance: no. Previously taking metformin 1000 mg BID but causing too much GI upset.  Medication Side Effects  Hypoglycemia: no   Preventitive Health Care  Eye Exam: Doesn't remember her last eye exam.   Foot Exam: hasn't had performed   Diet pattern: Drinks sweet tea, with eating breakfast and doesn't eat lunch or supper. She doesn't have any snacks.   Exercise: Walk to work. 2 hours a day.   HTN: Disease Monitoring: doesn't currently. Normally in the 140s/80's   Home BP Monitoring none Chest pain- no    Dyspnea- no Medications: Not currently taking any medciations  Compliance-  none. Lightheadedness-  none  Edema- yes, occurs at the end of the day.  She complains of heart fluttering. Only occurs at night and when she is doing a lot of moping. Causes some shortness of breath. May last up to 15 minutes and then goes away. Occurs up to twice a day. Stays about the same. It bothers her. Was suppose to have a heart monitor.   HLD Patient says she was told to stop her statin therapy based on her most recent lipid profile (2013). That specific profile indicates a high intensity statin. She reports no stain intolerance.   Current Outpatient Prescriptions on File Prior to Visit  Medication Sig Dispense Refill  . acetaminophen (TYLENOL) 650 MG CR tablet Take 2 tablets (1,300 mg total) by mouth 2 (two) times daily.  60 tablet  3  . aspirin 81 MG EC tablet Take 81 mg by mouth daily.        Marland Kitchen. lisinopril-hydrochlorothiazide (PRINZIDE,ZESTORETIC) 20-12.5 MG per tablet Take 1 tablet by mouth daily. For blood  pressure  93 tablet  5  . nitroGLYCERIN (NITROSTAT) 0.4 MG SL tablet Place 1 tablet (0.4 mg total) under the tongue every 5 (five) minutes as needed for chest pain.  100 tablet  3  . omeprazole (PRILOSEC) 20 MG capsule Take 1 capsule (20 mg total) by mouth daily.  30 capsule  3   No current facility-administered medications on file prior to visit.   SHx: She has never been a smoker   Health Maintenance: She denies wanting a colonoscopy. Mammogram is due. Diabetic foot exam performed today. Received Pneumococcal toady.   Review of Systems See HPI     Objective:   Physical Exam BP 147/88  Pulse 88  Temp(Src) 98.5 F (36.9 C) (Oral)  Ht 5\' 7"  (1.702 m)  Wt 238 lb 1.6 oz (108.001 kg)  BMI 37.28 kg/m2  Gen: NAD, alert, cooperative with exam, well-appearing, African American female, obese  HEENT: NCAT, EOMI, PERRL, clear conjunctiva, oropharynx clear CV: RRR, good S1/S2, no murmur, no edema, capillary refill brisk  Resp: CTABL, no wheezes, non-labored MSK: 5/5 strength in upper and lower extremities, sensation intact in upper and lower extremities b/l, +2 knee reflexes.  Left FOOT: callus formation on forefoot.  Skin: no rashes, normal turgor  Neuro: no gross deficits.  Psych: good insight, alert and oriented     Assessment:         Plan:

## 2013-09-21 ENCOUNTER — Telehealth: Payer: Self-pay | Admitting: Family Medicine

## 2013-09-21 NOTE — Telephone Encounter (Signed)
Pt called because she had to start taking lisinopril for her BP. She feels that the Lisinopril is making her bones weak and hurt. Please call. jw

## 2013-10-29 ENCOUNTER — Ambulatory Visit (INDEPENDENT_AMBULATORY_CARE_PROVIDER_SITE_OTHER): Payer: No Typology Code available for payment source | Admitting: Family Medicine

## 2013-10-29 VITALS — BP 159/86 | HR 89 | Temp 98.9°F | Ht 67.0 in | Wt 248.0 lb

## 2013-10-29 DIAGNOSIS — I10 Essential (primary) hypertension: Secondary | ICD-10-CM

## 2013-10-29 DIAGNOSIS — E785 Hyperlipidemia, unspecified: Secondary | ICD-10-CM

## 2013-10-29 DIAGNOSIS — M2550 Pain in unspecified joint: Secondary | ICD-10-CM

## 2013-10-29 DIAGNOSIS — M79673 Pain in unspecified foot: Secondary | ICD-10-CM

## 2013-10-29 DIAGNOSIS — M79609 Pain in unspecified limb: Secondary | ICD-10-CM

## 2013-10-29 LAB — BASIC METABOLIC PANEL
BUN: 15 mg/dL (ref 6–23)
CALCIUM: 9.3 mg/dL (ref 8.4–10.5)
CO2: 28 mEq/L (ref 19–32)
Chloride: 104 mEq/L (ref 96–112)
Creat: 0.66 mg/dL (ref 0.50–1.10)
Glucose, Bld: 145 mg/dL — ABNORMAL HIGH (ref 70–99)
Potassium: 4.3 mEq/L (ref 3.5–5.3)
SODIUM: 138 meq/L (ref 135–145)

## 2013-10-29 LAB — C-REACTIVE PROTEIN: CRP: 1.4 mg/dL — AB (ref <0.60)

## 2013-10-29 MED ORDER — ROSUVASTATIN CALCIUM 20 MG PO TABS: 20.0000 mg | ORAL_TABLET | Freq: Every day | ORAL | Status: DC

## 2013-10-29 MED ORDER — LISINOPRIL 10 MG PO TABS: 10.0000 mg | ORAL_TABLET | Freq: Every day | ORAL | Status: DC

## 2013-10-29 NOTE — Patient Instructions (Signed)
Thank you for coming in,   I will increase your lisinopril to 10 mg daily. I have added crestor 20 mg to take daily. Please let me know if you have any problems taking these medications.   I will call you with the lab results from today.   I will have your husband in my thoughts and hope everything goes well for him tomorrow.    Please feel free to call with any questions or concerns at any time, at [phone]. --Dr. Jordan LikesSchmitz  Plantar Fasciitis Plantar fasciitis is a common condition that causes foot pain. It is soreness (inflammation) of the band of tough fibrous tissue on the bottom of the foot that runs from the heel bone (calcaneus) to the ball of the foot. The cause of this soreness may be from excessive standing, poor fitting shoes, running on hard surfaces, being overweight, having an abnormal walk, or overuse (this is common in runners) of the painful foot or feet. It is also common in aerobic exercise dancers and ballet dancers. SYMPTOMS  Most people with plantar fasciitis complain of:  Severe pain in the morning on the bottom of their foot especially when taking the first steps out of bed. This pain recedes after a few minutes of walking.  Severe pain is experienced also during walking following a long period of inactivity.  Pain is worse when walking barefoot or up stairs DIAGNOSIS   Your caregiver will diagnose this condition by examining and feeling your foot.  Special tests such as X-rays of your foot, are usually not needed. PREVENTION   Consult a sports medicine professional before beginning a new exercise program.  Walking programs offer a good workout. With walking there is a lower chance of overuse injuries common to runners. There is less impact and less jarring of the joints.  Begin all new exercise programs slowly. If problems or pain develop, decrease the amount of time or distance until you are at a comfortable level.  Wear good shoes and replace them  regularly.  Stretch your foot and the heel cords at the back of the ankle (Achilles tendon) both before and after exercise.  Run or exercise on even surfaces that are not hard. For example, asphalt is better than pavement.  Do not run barefoot on hard surfaces.  If using a treadmill, vary the incline.  Do not continue to workout if you have foot or joint problems. Seek professional help if they do not improve. HOME CARE INSTRUCTIONS   Avoid activities that cause you pain until you recover.  Use ice or cold packs on the problem or painful areas after working out.  Only take over-the-counter or prescription medicines for pain, discomfort, or fever as directed by your caregiver.  Soft shoe inserts or athletic shoes with air or gel sole cushions may be helpful.  If problems continue or become more severe, consult a sports medicine caregiver or your own health care provider. Cortisone is a potent anti-inflammatory medication that may be injected into the painful area. You can discuss this treatment with your caregiver. MAKE SURE YOU:   Understand these instructions.  Will watch your condition.  Will get help right away if you are not doing well or get worse. Document Released: 02/20/2001 Document Revised: 08/20/2011 Document Reviewed: 04/21/2008 [facility] Patient Information 2014 [employer], [place].

## 2013-10-30 ENCOUNTER — Encounter: Payer: Self-pay | Admitting: Family Medicine

## 2013-10-30 DIAGNOSIS — M79673 Pain in unspecified foot: Secondary | ICD-10-CM | POA: Insufficient documentation

## 2013-10-30 LAB — SEDIMENTATION RATE: SED RATE: 25 mm/h — AB (ref 0–22)

## 2013-10-30 NOTE — Assessment & Plan Note (Signed)
Most likely plantar fasciitis. Risk factor include being obese, female and prolonged standing at work. No trauma. No signs of infection.  - given home modalities  - tylenol for pain - f/u if pain not improved  -

## 2013-10-30 NOTE — Assessment & Plan Note (Signed)
Reported being told to stop previously. Lipid panel during last encounter.  - starting crestor 20 mg

## 2013-10-30 NOTE — Progress Notes (Signed)
   Subjective:    Patient ID: Brandi Davenport, female    DOB: Apr 06, 1956, 58 y.o.   MRN: [id number]  HPI Brandi Davenport is here for left heel pain, HTN and HLD.  She has heel pain. Pain is worse at the end of the day. She has no weakness of loss of sensation. She is on her feet working all day and standing on concrete. The pain is sharp and achy. Worse upon walking with the first few steps. No recent trauma.   HTN Disease Monitoring:none Home BP Monitoring none Chest pain- none    Dyspnea- none Medications:lisinopril  Compliance-  Yes . Lightheadedness-  none  Edema- some in her left ankle   Previously stated that she was told to stop her statin. She denies any intolerance. Last lipid panel was indicative of starting a statin. She is willing to start therapy.    Current Outpatient Prescriptions on File Prior to Visit  Medication Sig Dispense Refill  . acetaminophen (TYLENOL) 650 MG CR tablet Take 2 tablets (1,300 mg total) by mouth 2 (two) times daily.  60 tablet  3  . aspirin 81 MG EC tablet Take 81 mg by mouth daily.        Marland Kitchen lisinopril-hydrochlorothiazide (PRINZIDE,ZESTORETIC) 20-12.5 MG per tablet Take 1 tablet by mouth daily. For blood pressure  93 tablet  5  . metFORMIN (GLUCOPHAGE XR) 500 MG 24 hr tablet Take 1 tablet (500 mg total) by mouth daily with breakfast.  90 tablet  1  . nitroGLYCERIN (NITROSTAT) 0.4 MG SL tablet Place 1 tablet (0.4 mg total) under the tongue every 5 (five) minutes as needed for chest pain.  100 tablet  3  . omeprazole (PRILOSEC) 20 MG capsule Take 1 capsule (20 mg total) by mouth daily.  30 capsule  3   No current facility-administered medications on file prior to visit.    Review of Systems Se HPI     Objective:   Physical Exam BP 159/86  Pulse 89  Temp(Src) 98.9 F (37.2 C) (Oral)  Ht 5\' 7"  (1.702 m)  Wt 248 lb (112.492 kg)  BMI 38.83 kg/m2 Gen: NAD, alert, cooperative with exam, well-appearing, African [place] female.  CV: RRR, good  S1/S2, no murmur, no edema, capillary refill brisk  Resp: CTABL, no wheezes, non-labored HDQ:QIWL foot: no medial or lateral malleolus tenderness, no fifth digit tenderness, tenderness upon palpation of talus, worse upon dorsal extension      Assessment & Plan:

## 2013-10-30 NOTE — Assessment & Plan Note (Signed)
Uncontrolled. Elevated today and has no problems taking lisinopril  - increased lisinopril to 10 mg daily  - BMP today normal K

## 2013-11-03 ENCOUNTER — Telehealth: Payer: Self-pay | Admitting: Family Medicine

## 2013-11-03 ENCOUNTER — Encounter: Payer: Self-pay | Admitting: *Deleted

## 2013-11-03 DIAGNOSIS — E785 Hyperlipidemia, unspecified: Secondary | ICD-10-CM

## 2013-11-03 NOTE — Progress Notes (Signed)
Prior authorization received from [facility] for Crestor.  PA form placed in provider box for review.  Clovis Pu, RN

## 2013-11-03 NOTE — Telephone Encounter (Signed)
Would like test results Needs refill on choloestrol.  Pharmacy says they need authorization for something Please advsie

## 2013-11-06 NOTE — Progress Notes (Signed)
PA form completed by provider and faxed to [facility] for review.  Clovis Pu, RN

## 2013-11-10 NOTE — Progress Notes (Signed)
PA for crestor was denied; because no documentation of intolerable side effects or a step through two of the following: atorvastatin, lovastatin, pravastatin or simvastatin.  Crestor is covered after documentation of a step through two of the following medication that was listed.  [facility] pharmacy aware.  Clovis Pu, RN

## 2013-11-12 MED ORDER — ATORVASTATIN CALCIUM 20 MG PO TABS: 20.0000 mg | ORAL_TABLET | Freq: Every day | ORAL | Status: DC

## 2013-11-12 NOTE — Telephone Encounter (Signed)
Will d/c crestor as not covered by insurance. Will try atorvastatin 20 mg daily.

## 2013-11-26 ENCOUNTER — Ambulatory Visit (HOSPITAL_COMMUNITY)
Admission: RE | Admit: 2013-11-26 | Discharge: 2013-11-26 | Disposition: A | Discharge status: Home / Self Care | Payer: No Typology Code available for payment source | Source: Ambulatory Visit | Attending: Family Medicine | Admitting: Family Medicine

## 2013-11-26 ENCOUNTER — Ambulatory Visit (INDEPENDENT_AMBULATORY_CARE_PROVIDER_SITE_OTHER): Payer: No Typology Code available for payment source | Admitting: Family Medicine

## 2013-11-26 ENCOUNTER — Encounter: Payer: Self-pay | Admitting: Family Medicine

## 2013-11-26 VITALS — BP 125/68 | HR 80 | Temp 99.0°F | Ht 67.0 in | Wt 234.0 lb

## 2013-11-26 DIAGNOSIS — M79609 Pain in unspecified limb: Secondary | ICD-10-CM

## 2013-11-26 DIAGNOSIS — M79673 Pain in unspecified foot: Secondary | ICD-10-CM

## 2013-11-26 DIAGNOSIS — M7730 Calcaneal spur, unspecified foot: Secondary | ICD-10-CM | POA: Insufficient documentation

## 2013-11-26 DIAGNOSIS — M19079 Primary osteoarthritis, unspecified ankle and foot: Secondary | ICD-10-CM | POA: Insufficient documentation

## 2013-11-26 MED ORDER — TRAMADOL HCL 50 MG PO TABS: 50.0000 mg | ORAL_TABLET | Freq: Three times a day (TID) | ORAL | Status: DC | PRN

## 2013-11-26 NOTE — Assessment & Plan Note (Addendum)
A: Recent eval and presumptive diagnosis of plantar fasciitis; likely component of this plus possible contribution of heel spur and osteoarthritis (secondary to weight, prolonged standing at work, etc). Xray obtained today shows heel spurs and osteoarthritic changes as suggested by history and exam as well as xray about 6 years ago; xray results left on voice-identified voice mail with reiterated AVS and plan instructions as detailed below.   P: Provided pt with post-op shoe and reviewed some gentle stretching exercises and reiterated instructions for using frozen water bottle etc to roll foot on. Also gave short course Rx for tramadol with caution about sedating and instructions no to use within 6 hours of driving. Provided pt sports medicine clinic phone number and instructed to f/u with them and with PCP. Will route this note and xray to Dr. Jordan LikesSchmitz.  (Unrelated complaint of body aches and medication issues deferred to PCP.)

## 2013-11-26 NOTE — Patient Instructions (Signed)
Thank you for coming in, today!  I think your heel pain is related to your heel spur and possibly plantar fasciitis (inflammation of the soft tissue in the bottom of your foot). I want to get an x-ray of your foot and I will give you a shoe that may help the pain. [facility] any [facility] to get this done. I will call you with the result of the xray and tell you what, if anything needs to be done about it.  Call the sports medicine office to make an appointment. Their phone number is [phone]).  You should follow up with Dr. Jordan LikesSchmitz about your body pain and problems with your cholesterol medicine. He will be able to see my note and what all we talked about and see your xray results as well. Please feel free to call with any questions or concerns at any time, at [phone]. --Dr. Casper HarrisonStreet

## 2013-11-26 NOTE — Progress Notes (Signed)
   Subjective:    Patient ID: Brandi Davenport, female    DOB: [birth year], 58 y.o.   MRN: [id number]  HPI: Pt presents to clinic for SDA for left foot pain. She reports pain "all day, constant, stabbing," in her left heel, in the bottom and sides of her foot. Her pain has been present for over a month and is not really worse but is "no better at all." She was seen recently by her PCP who recommended supportive care such as rolling a frozen water bottle, stretching, rest, etc; none of these has helped. She has been taken Tylenol without any relief at all. She does not take NSAIDs due to stomach ulcers. Of note, pt had an ankle xray several years ago that showed a probable left calcaneal spur. She has used a foot brace in the past, but does not have it any more. She has not been to the sports medicine office.  Of note, pt states she has been having all-over "bone pain" and has had some bone pain. She was seen by her PCP recently and had lab work done but states she did not get the results. CRP and ESR were elevated at that time. She has had issues getting mediation for cholesterol, as well, but is unclear whether or not pt been taking a statin recently (Pravachol is on historical med list, and there are telephone notes about new Rx for Lipitor and Crestor).  Review of Systems: As above. Denies fever / chills, N/V, abdominal pain, rashes.     Objective:   Physical Exam BP 125/68  Pulse 80  Temp(Src) 99 F (37.2 C) (Oral)  Ht $R'5\' 7"'CD$  (1.702 m)  Wt 234 lb (106.142 kg)  BMI 36.64 kg/m2 Gen: well-appearing adult female in NAD MSK: Bilateral feet with flattening of arches  Left foot with split callous to medial ball of foot otherwise normal to appearance  Left foot with tenderness to palpation on plantar surface over bony prominence of heel  Pain with palpation of left foot at plantar fascia origin on calcaneus  Pain with squeeze of left heel  Increased pain with active and passive ROM through all  planes, worst with plantarflexion  Right foot otherwise normal without tenderness or painful ROM     Assessment & Plan:

## 2013-12-14 ENCOUNTER — Ambulatory Visit: Payer: Self-pay | Admitting: Family Medicine

## 2013-12-16 ENCOUNTER — Ambulatory Visit: Payer: Self-pay | Admitting: Family Medicine

## 2014-02-04 ENCOUNTER — Ambulatory Visit (INDEPENDENT_AMBULATORY_CARE_PROVIDER_SITE_OTHER): Payer: No Typology Code available for payment source | Admitting: Family Medicine

## 2014-02-04 ENCOUNTER — Ambulatory Visit
Admission: RE | Admit: 2014-02-04 | Discharge: 2014-02-04 | Disposition: A | Discharge status: Home / Self Care | Payer: No Typology Code available for payment source | Source: Ambulatory Visit | Attending: Family Medicine | Admitting: Family Medicine

## 2014-02-04 ENCOUNTER — Encounter: Payer: Self-pay | Admitting: Family Medicine

## 2014-02-04 VITALS — BP 147/81 | HR 101 | Temp 98.4°F | Ht 67.0 in | Wt 254.0 lb

## 2014-02-04 DIAGNOSIS — M25552 Pain in left hip: Secondary | ICD-10-CM

## 2014-02-04 DIAGNOSIS — M25559 Pain in unspecified hip: Secondary | ICD-10-CM

## 2014-02-04 MED ORDER — TRAMADOL HCL 50 MG PO TABS: 50.0000 mg | ORAL_TABLET | Freq: Three times a day (TID) | ORAL | Status: DC | PRN

## 2014-02-04 NOTE — Progress Notes (Signed)
    Subjective   Brandi Davenport is a 58 y.o. female that presents for a same day visit  1. Hip pain: Symptoms started about one month ago. Located at left hip with no radiation down leg. She has taken Tylenol  and ibuprofen  6-8 hours but they have not helped. Pain is constant and aggravated by bending forward, walking and touching the area. No history of injury of fall although she was more active about a month ago with moving. No urinary or bladder incontinence.  History  Substance Use Topics  . Smoking status: Never Smoker   . Smokeless tobacco: Not on file  . Alcohol Use: Not on file    ROS Per HPI  Objective   BP 147/81  Pulse 101  Temp(Src) 98.4 F (36.9 C) (Oral)  Ht  (1.702 m)  Wt 254 lb (115.214 kg)  BMI 39.77 kg/m2  General: Well appearing, no acute distress Musculoskeletal: Point tenderness at sacroiliac joint with tenderness on medial aspect of upper thigh. Pain on external rotation with some pain at 80 degrees straight leg raise on left side. Right side without abnormality. Gait is abnormal with favor for unaffected side  Assessment and Plan   Please refer to problem based charting of assessment and plan

## 2014-02-04 NOTE — Patient Instructions (Addendum)
Thank you for coming to see me today. It was a pleasure. Today we talked about:   Left hip pain: I will prescribe some Tramadol and get some x-rays. You will be contacted with the results and further management. Please limit your Tylenol to  per day and your ibuprofen to  per day.  Please make an appointment to see Dr. Jordan Likes in 2 weeks for follow-up.  If you have any questions or concerns, please do not hesitate to call the office at [phone].  Sincerely,  Jacquelin Hawking, MD

## 2014-02-05 DIAGNOSIS — M25552 Pain in left hip: Secondary | ICD-10-CM | POA: Insufficient documentation

## 2014-02-05 NOTE — Assessment & Plan Note (Signed)
Pain seems to be related to sacroiliac joint, although cannot rule out hip fracture. Will obtain plain films for left hip, lumbar spine and sacrum. Tramadol for pain. May need referral to sports medicine depending on results from x-rays.

## 2014-02-12 ENCOUNTER — Telehealth: Payer: Self-pay | Admitting: Family Medicine

## 2014-02-12 NOTE — Telephone Encounter (Signed)
Discussed results of x-ray. 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 Patient is not taking Tramadol because it is causing her to have chest pain. She is not currently having any. She is taking Ibuprofen  as needed for pain, which helps. She will make an appointment to follow-up with PCP for further management of her back pain.

## 2014-03-12 ENCOUNTER — Encounter: Payer: Self-pay | Admitting: Family Medicine

## 2014-03-12 ENCOUNTER — Ambulatory Visit (INDEPENDENT_AMBULATORY_CARE_PROVIDER_SITE_OTHER): Payer: No Typology Code available for payment source | Admitting: Family Medicine

## 2014-03-12 VITALS — BP 155/86 | HR 77 | Temp 98.5°F | Ht 67.0 in | Wt 236.0 lb

## 2014-03-12 DIAGNOSIS — M25552 Pain in left hip: Secondary | ICD-10-CM

## 2014-03-12 DIAGNOSIS — Z23 Encounter for immunization: Secondary | ICD-10-CM

## 2014-03-12 MED ORDER — CYCLOBENZAPRINE HCL 10 MG PO TABS: 5.0000 mg | ORAL_TABLET | Freq: Every evening | ORAL | Status: DC | PRN

## 2014-03-12 NOTE — Assessment & Plan Note (Addendum)
Pain appears to be more SI joint dysfunction than left hip trochanteric bursitis. X-rays were negative for any fracture. Patient had very weak hip abduction bilaterally. Deep tendon reflexes were intact as well as sensation bilaterally in lower extremities.  Patient with cavus foot with no great toe insuffiencey. Normal gait.  - Flexeril 5 mg at night - advised to get Spenco insoles or insoles from [facility] - given home modalities.  - f/u in 3-4 weeks - may consider referral to PT

## 2014-03-12 NOTE — Progress Notes (Signed)
   Subjective:    Patient ID: Brandi Davenport, female    DOB: 2/28/[name]1957, 58 y.o.   MRN: [id number]  HPI  Brandi Davenport is here for f/u of her groin and hip pain. Sacral and hip were reviewed and negative for fracture. Lumbar spine showing osteoarthritic changes several levels.   The pain starts in the groin and and also posterior buttocks. Pain occurs when she is working. She describes it as "catching" her. Never had pain like this before. No prior injuries or surgeries. No weaknes or sensation.  She wears reeboks and new balance shoes at work. She doesn't have much of an insole left.   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   Current Outpatient Prescriptions on File Prior to Visit  Medication Sig Dispense Refill  . acetaminophen (TYLENOL) 650 MG CR tablet Take 2 tablets (1,300 mg total) by mouth 2 (two) times daily.  60 tablet  3  . aspirin 81 MG EC tablet Take 81 mg by mouth daily.        Marland Kitchen. atorvastatin (LIPITOR) 20 MG tablet Take 1 tablet (20 mg total) by mouth daily.  60 tablet  1  . lisinopril (PRINIVIL,ZESTRIL) 10 MG tablet Take 1 tablet (10 mg total) by mouth daily.  90 tablet  3  . metFORMIN (GLUCOPHAGE XR) 500 MG 24 hr tablet Take 1 tablet (500 mg total) by mouth daily with breakfast.  90 tablet  1  . omeprazole (PRILOSEC) 20 MG capsule Take 1 capsule (20 mg total) by mouth daily.  30 capsule  3  . lisinopril-hydrochlorothiazide (PRINZIDE,ZESTORETIC) 20-12.5 MG per tablet Take 1 tablet by mouth daily. For blood pressure  93 tablet  5  . nitroGLYCERIN (NITROSTAT) 0.4 MG SL tablet Place 1 tablet (0.4 mg total) under the tongue every 5 (five) minutes as needed for chest pain.  100 tablet  3   No current facility-administered medications on file prior to visit.   Health Maintenance: received flu vaccine today   Review of Systems See HPI     Objective:   Physical Exam BP 155/86  Pulse 77  Temp(Src) 98.5 F (36.9 C) (Oral)  Ht 5\' 7"  (1.702 m)  Wt 236 lb (107.049 kg)  BMI  36.95 kg/m2 Gen: NAD, alert, cooperative with exam, well-appearing, obese, female  MSK: normal gait, cavus feet, weak hip abduction b/l, normal ROM of hip and knee      Assessment & Plan:

## 2014-03-12 NOTE — Patient Instructions (Signed)
Thank you for coming in,   Please try to find some arch supports for your feet. If you have access to buying products online, then spendco is a good brand.   We will try flexeril to help loosen your muscles up. This medication may cause some drowsiness so make sure to not take it before driving.   Please try these stretches and exercises at least three times a week.   If this doesn't improve your symptoms after 3-4 weeks then we may need to try some physical therapy.    Please feel free to call with any questions or concerns at any time, at [phone]. --Dr. Jordan LikesSchmitz

## 2014-05-13 ENCOUNTER — Encounter: Payer: Self-pay | Admitting: Family Medicine

## 2014-05-13 ENCOUNTER — Ambulatory Visit (INDEPENDENT_AMBULATORY_CARE_PROVIDER_SITE_OTHER): Payer: No Typology Code available for payment source | Admitting: Family Medicine

## 2014-05-13 ENCOUNTER — Telehealth: Payer: Self-pay | Admitting: Family Medicine

## 2014-05-13 VITALS — BP 122/95 | HR 73 | Temp 98.4°F | Ht 67.0 in | Wt 233.2 lb

## 2014-05-13 DIAGNOSIS — K219 Gastro-esophageal reflux disease without esophagitis: Secondary | ICD-10-CM

## 2014-05-13 DIAGNOSIS — K529 Noninfective gastroenteritis and colitis, unspecified: Secondary | ICD-10-CM

## 2014-05-13 MED ORDER — OMEPRAZOLE 20 MG PO CPDR: 20.0000 mg | DELAYED_RELEASE_CAPSULE | Freq: Every day | ORAL | Status: DC

## 2014-05-13 MED ORDER — ONDANSETRON 4 MG PO TBDP: 4.0000 mg | ORAL_TABLET | Freq: Three times a day (TID) | ORAL | Status: DC | PRN

## 2014-05-13 NOTE — Patient Instructions (Signed)
Take zofran as needed for nausea and vomiting. Wash your hands and push fluids. If not better in the next 5 days or so, come back in for reevaluation.

## 2014-05-13 NOTE — Telephone Encounter (Signed)
Pt called because the prescriptions Prilosec and Zofran are to expensive and her insurance will not cover them. [facility]

## 2014-05-13 NOTE — Assessment & Plan Note (Signed)
H/o sick contacts, maintaining hydration.  - Supportive care - Zofran prn nausea - Push adequate fluids - RTC if fever, worsening, unable to take fluids.

## 2014-05-13 NOTE — Assessment & Plan Note (Signed)
Reports history of ulcer to me though she has not been able to fill medications for about 1 year, so will refill omeprazole 20mg  and recommended PCP follow up.

## 2014-05-13 NOTE — Progress Notes (Signed)
   Subjective:  Brandi GallusSherline Davenport is a 58 y.o. female presenting to same day clinic for stomach upset.  Vomiting began 9 days ago. Progression: constant, perhaps better Number of times vomited in last day: 1 Medications tried: none Recent travel: no Recent sick contacts: yes, 2 family members visiting had nausea, vomiting just before she developed the same symptoms. Ingested suspicious foods: no Immunocompromised: no  Symptoms Diarrhea: yes, without blood or mucous Abdominal pain: no Blood in vomit: no Weight loss: no Decreased urine output:no Lightheadedness: no Fever: no Bloody stools: no  - Review of Systems: Per HPI.  - Smoking status noted Objective:  BP 122/95 mmHg  Pulse 73  Temp(Src) 98.4 F (36.9 C) (Oral)  Ht 5\' 7"  (1.702 m)  Wt 233 lb 3.2 oz (105.779 kg)  BMI 36.52 kg/m2 Gen:  58 y.o. female in no distress Moist mucous membranes, normal skin turgor, cap refill < 2 sec. GI: NormoactiveBS; soft, non-tender, non-distended, no organomegaly, no hernia appreciated  Assessment/Plan:  Brandi GallusSherline Davenport is a 58 y.o. female here for symptoms consistent with viral gastroenteritis.  Problem List Items Addressed This Visit      Digestive   GERD    Reports history of ulcer to me though she has not been able to fill medications for about 1 year, so will refill omeprazole 20mg  and recommended PCP follow up.     Relevant Medications      omeprazole (PRILOSEC) capsule      ondansetron (ZOFRAN-ODT) disintegrating tablet   Gastroenteritis, acute - Primary    H/o sick contacts, maintaining hydration.  - Supportive care - Zofran prn nausea - Push adequate fluids - RTC if fever, worsening, unable to take fluids.    Relevant Medications      ondansetron (ZOFRAN-ODT) disintegrating tablet

## 2014-05-26 MED ORDER — RANITIDINE HCL 150 MG PO TABS: 150.0000 mg | ORAL_TABLET | Freq: Two times a day (BID) | ORAL | Status: DC

## 2014-05-27 NOTE — Telephone Encounter (Signed)
Spoke with pt and informed her of Dr. Jordan LikesSchmitz message below.

## 2014-06-18 ENCOUNTER — Encounter: Payer: Self-pay | Admitting: Family Medicine

## 2014-06-18 ENCOUNTER — Ambulatory Visit (INDEPENDENT_AMBULATORY_CARE_PROVIDER_SITE_OTHER): Payer: Self-pay | Admitting: Family Medicine

## 2014-06-18 VITALS — BP 142/87 | HR 96 | Temp 98.4°F | Ht 67.0 in | Wt 233.9 lb

## 2014-06-18 DIAGNOSIS — F321 Major depressive disorder, single episode, moderate: Secondary | ICD-10-CM

## 2014-06-18 DIAGNOSIS — E785 Hyperlipidemia, unspecified: Secondary | ICD-10-CM

## 2014-06-18 DIAGNOSIS — F329 Major depressive disorder, single episode, unspecified: Secondary | ICD-10-CM

## 2014-06-18 DIAGNOSIS — F32A Depression, unspecified: Secondary | ICD-10-CM

## 2014-06-18 DIAGNOSIS — R229 Localized swelling, mass and lump, unspecified: Secondary | ICD-10-CM

## 2014-06-18 DIAGNOSIS — IMO0002 Reserved for concepts with insufficient information to code with codable children: Secondary | ICD-10-CM

## 2014-06-18 DIAGNOSIS — E118 Type 2 diabetes mellitus with unspecified complications: Secondary | ICD-10-CM

## 2014-06-18 LAB — POCT GLYCOSYLATED HEMOGLOBIN (HGB A1C): Hemoglobin A1C: 8.1

## 2014-06-18 MED ORDER — GLIMEPIRIDE 2 MG PO TABS
2.0000 mg | ORAL_TABLET | Freq: Every day | ORAL | Status: DC
Start: 2014-06-18 — End: 2015-01-18

## 2014-06-18 MED ORDER — LOVASTATIN 40 MG PO TABS: 40.0000 mg | ORAL_TABLET | Freq: Every day | ORAL | Status: DC

## 2014-06-18 MED ORDER — SERTRALINE HCL 50 MG PO TABS: 50.0000 mg | ORAL_TABLET | Freq: Every day | ORAL | Status: DC

## 2014-06-18 NOTE — Assessment & Plan Note (Signed)
Hx of and appears to be worsening. Hx of taking celexa.  - zoloft 50 mg QD  - f/u in two weeks.

## 2014-06-18 NOTE — Assessment & Plan Note (Signed)
Uncontrolled. Unable to tolerate metformin.  - start amaryl  - unable to afford meter/strips/lancets. Will prescribe in the future when she can afford.

## 2014-06-18 NOTE — Assessment & Plan Note (Signed)
2 cm x 2 cm Tender, mobile, indurated on medial aspect of inner left thigh. Present for two weeks. No signs of infection. Could be a lipoma.  - will have her stop with neosporin and alcohol  - could refer to gen surgery but she doesn't want surgery at this time - consider referral in future.  - discussed with Dr. Mauricio PoBreen.

## 2014-06-18 NOTE — Progress Notes (Signed)
   Subjective:    Patient ID: Brandi Davenport, female    DOB: June 25, 1955, 59 y.o.   MRN: [id number]  HPI  Keela Davenport is here for DM2, left thigh pain and depression.   CHRONIC DIABETES  Disease Monitoring  Blood Sugar Ranges: doesn't measure since on metformin  Polyuria: no   Visual problems: no   Medication Compliance: yes, but not tolerating the metformin.   Medication Side Effects  Hypoglycemia: no   GI: having loose stools since she has been taking the medication. It hasn't gotten better the longer she has taken the medication.   Left thigh mass: patient with a hard spot on the medial aspect of her left thigh. It is painful when touched. Her thighs rub together and this causes pain while walking. She has been putting neosporin and alcohol on it. It appeared about 2 weeks ago. There is no draining or oozing.   Depression: Feeling more down since before the holidays. She feels this way all the time. She isn't happy with her husband. There is a large age gap between the two of them. He has cancer as well. She doesn't get much sleep at night, at most 4 hours. She goes to church as her way of having fun. She eats sweets most of the time even though she knows this is bad for her diabetes. She denies any HI/SI. Denies any manic symptoms.    Current Outpatient Prescriptions on File Prior to Visit  Medication Sig Dispense Refill  . acetaminophen (TYLENOL) 650 MG CR tablet Take 2 tablets (1,300 mg total) by mouth 2 (two) times daily. 60 tablet 3  . aspirin 81 MG EC tablet Take 81 mg by mouth daily.      . cyclobenzaprine (FLEXERIL) 10 MG tablet Take 0.5 tablets (5 mg total) by mouth at bedtime as needed for muscle spasms. 30 tablet 0  . lisinopril (PRINIVIL,ZESTRIL) 10 MG tablet Take 1 tablet (10 mg total) by mouth daily. 90 tablet 3  . lisinopril-hydrochlorothiazide (PRINZIDE,ZESTORETIC) 20-12.5 MG per tablet Take 1 tablet by mouth daily. For blood pressure 93 tablet 5  . metFORMIN  (GLUCOPHAGE XR) 500 MG 24 hr tablet Take 1 tablet (500 mg total) by mouth daily with breakfast. 90 tablet 1  . nitroGLYCERIN (NITROSTAT) 0.4 MG SL tablet Place 1 tablet (0.4 mg total) under the tongue every 5 (five) minutes as needed for chest pain. 100 tablet 3  . ondansetron (ZOFRAN-ODT) 4 MG disintegrating tablet Take 1 tablet (4 mg total) by mouth every 8 (eight) hours as needed for nausea or vomiting. 20 tablet 0  . ranitidine (ZANTAC) 150 MG tablet Take 1 tablet (150 mg total) by mouth 2 (two) times daily. 60 tablet 1   No current facility-administered medications on file prior to visit.    Review of Systems See HPI     Objective:   Physical Exam BP 142/87 mmHg  Pulse 96  Temp(Src) 98.4 F (36.9 C) (Oral)  Ht 5\' 7"  (1.702 m)  Wt 233 lb 14.4 oz (106.096 kg)  BMI 36.63 kg/m2 Gen: NAD, alert, cooperative with exam CV: RRR, good S1/S2, no murmur,  Skin: 2 cm x 2 cm subcutaneous, mobile, tender lesion under the skin. No warmth, erythema or draining  Neuro: no gross deficits.  Psych: flat affect, poor modd  PHQ-9: 10        Assessment & Plan:

## 2014-06-18 NOTE — Patient Instructions (Signed)
Thank you for coming in,   Please follow up with me in two weeks to see how the zoloft is working.   Stop taking the metformin and start taking the amaryl.    Please feel free to call with any questions or concerns at any time, at [phone]. --Dr. Jordan LikesSchmitz  Glimepiride tablets What is this medicine? GLIMEPIRIDE (GLYE me pye ride) helps to treat type 2 diabetes. Treatment is combined with diet and exercise. This medicine helps your body use insulin better. This medicine may be used for other purposes; ask your health care provider or pharmacist if you have questions. COMMON BRAND NAME(S): Amaryl What should I tell my health care provider before I take this medicine? They need to know if you have any of these conditions: -diabetic ketoacidosis -glucose-6-phosphate dehydrogenase deficiency -heart disease -kidney disease -liver disease -severe infection or injury -thyroid disease -an unusual or allergic reaction to glimepiride, sulfa drugs, other medicines, foods, dyes, or preservatives -pregnancy or recent attempts to get pregnant -breast-feeding How should I use this medicine? Take this medicine by mouth. Swallow with a drink of water. Follow the directions on the prescription label. Take your dose at the same time each day, with breakfast or your first large meal. Do not take more often than directed. Talk to your pediatrician regarding the use of this medicine in children. Special care may be needed. Elderly patients over 57 years old can have a stronger reaction and need a smaller dose. Overdosage: If you think you have taken too much of this medicine contact a poison control center or emergency room at once. NOTE: This medicine is only for you. Do not share this medicine with others. What if I miss a dose? If you miss a dose, take it as soon as you can. If it is almost time for your next dose, take only that dose. Do not take double or extra doses. What may interact with this  medicine? -bosentan -chloramphenicol -cisapride -clarithromycin -medicines for fungal or yeast infections -metoclopramide -probenecid -warfarin Many medications may cause an increase or decrease in blood sugar, these include: -alcohol containing beverages -aspirin and aspirin-like drugs -chloramphenicol -chromium -female hormones, like estrogens or progestins and birth control pills -fluoxetine -heart medicines like disopyramide -isoniazid -female hormones or anabolic steroids -medicines called MAO Inhibitors like Nardil, Parnate, Marplan, Eldepryl -medicines for allergies, asthma, cold, or cough -medicines for mental problems -medicines for weight loss -niacin -NSAIDs, medicines for pain and inflammation, like ibuprofen or naproxen -pentamidine -phenytoin -probenecid -quinolone antibiotics like ciprofloxacin, levofloxacin, ofloxacin -some herbal dietary supplements -steroid medicines like prednisone or cortisone -thyroid medicine -water pills or diuretics This list may not describe all possible interactions. Give your health care provider a list of all the medicines, herbs, non-prescription drugs, or dietary supplements you use. Also tell them if you smoke, drink alcohol, or use illegal drugs. Some items may interact with your medicine. What should I watch for while using this medicine? Visit your doctor or health care professional for regular checks on your progress. A test called the HbA1C (A1C) will be monitored. This is a simple blood test. It measures your blood sugar control over the last 2 to 3 months. You will receive this test every 3 to 6 months. Learn how to check your blood sugar. Learn the symptoms of low and high blood sugar and how to manage them. Always carry a quick-source of sugar with you in case you have symptoms of low blood sugar. Examples include hard sugar candy or  glucose tablets. Make sure others know that you can choke if you eat or drink when you  develop serious symptoms of low blood sugar, such as seizures or unconsciousness. They must get medical help at once. Tell your doctor or health care professional if you have high blood sugar. You might need to change the dose of your medicine. If you are sick or exercising more than usual, you might need to change the dose of your medicine. Do not skip meals. Ask your doctor or health care professional if you should avoid alcohol. Many nonprescription cough and cold products contain sugar or alcohol. These can affect blood sugar. This medicine can make you more sensitive to the sun. Keep out of the sun. If you cannot avoid being in the sun, wear protective clothing and use sunscreen. Do not use sun lamps or tanning beds/booths. Wear a medical ID bracelet or chain, and carry a card that describes your disease and details of your medicine and dosage times. What side effects may I notice from receiving this medicine? Side effects that you should report to your doctor or health care professional as soon as possible: -allergic reactions like skin rash, itching or hives, swelling of the face, lips, or tongue -breathing problems -dark urine -fever, chills, sore throat -signs and symptoms of low blood sugar such as feeling anxious, confusion, dizziness, increased hunger, unusually weak or tired, sweating, shakiness, cold, irritable, headache, blurred vision, fast heartbeat, loss of consciousness -unusual bleeding or bruising -yellowing of the eyes or skin Side effects that usually do not require medical attention (report to your doctor or health care professional if they continue or are bothersome): -diarrhea -dizziness -headache -heartburn -nausea -stomach gas This list may not describe all possible side effects. Call your doctor for medical advice about side effects. You may report side effects to FDA at 1-800-FDA-1088. Where should I keep my medicine? Keep out of the reach of children. Store at  room temperature below 30 degrees C (86 degrees F). Throw away any unused medicine after the expiration date. NOTE: This sheet is a summary. It may not cover all possible information. If you have questions about this medicine, talk to your doctor, pharmacist, or health care provider.  2015, Elsevier/Gold Standard. (2012-09-10 14:29:47)

## 2014-06-30 ENCOUNTER — Ambulatory Visit (INDEPENDENT_AMBULATORY_CARE_PROVIDER_SITE_OTHER): Payer: Self-pay | Admitting: Family Medicine

## 2014-06-30 VITALS — Temp 98.7°F | Wt 234.2 lb

## 2014-06-30 DIAGNOSIS — J069 Acute upper respiratory infection, unspecified: Secondary | ICD-10-CM

## 2014-06-30 MED ORDER — FLUTICASONE PROPIONATE 50 MCG/ACT NA SUSP: 2.0000 | Freq: Every day | NASAL | Status: DC

## 2014-06-30 NOTE — Progress Notes (Signed)
    Subjective   Brandi Davenport is a 59 y.o. female that presents for a same day visit  1. Sore throat: Symptoms started two days ago. Associate with runny nose, sneezing and coughing. Coughing has started to make her chest hurt. She has two episodes of post-tussive non-bloody emesis. No fevers but has chills and some sweats. She has some body aches. She has been drinking tea with honey, Robotussin and Mucinex, which have not helped. She has had her flu shot in October of 2015. No sick contacts. Symptoms overall have remained unchanged.  History  Substance Use Topics  . Smoking status: Never Smoker   . Smokeless tobacco: Not on file  . Alcohol Use: Not on file    ROS Per HPI  Objective   Temp(Src) 98.7 F (37.1 C) (Oral)  Wt 234 lb 3.2 oz (106.232 kg)  General: Fair appearing, no distress HEENT: PERRL, mildly edematous nasal turbinates, tonsils 2+ without erythema or exudate  Assessment and Plan   URI: no red flags to suggest bacterial infection. Vitals stable  Tylenol for pain PRN  Return precautions discussed  OTC cough suppression (without decongestant)  Flonase 2 sprays each nostril qD  Work note provided

## 2014-06-30 NOTE — Patient Instructions (Signed)
Thank you for coming to see me today. It was a pleasure. Today we talked about:   Cough/sneezing/runny nose: It seems like you have a viral infection. I will recommend over the counter cough medication without a decongestant (Coricidin is one). I will also prescribe Flonase to help with congestion  Please make an appointment to see Dr. Jordan LikesSchmitz whenever agreed upon for your follow-up  If you have any questions or concerns, please do not hesitate to call the office at [phone].  Sincerely,  Jacquelin Hawkingalph Adrianna Dudas, MD

## 2014-07-01 ENCOUNTER — Telehealth: Payer: Self-pay | Admitting: *Deleted

## 2014-07-01 NOTE — Telephone Encounter (Signed)
Employer [employer] called on behalf of patient.  She received work note to return on 07/02/2014.  Ms. Michaell CowingGross wants the note to say out until 07/03/2014 due to patient "being really sick".  Informed her I would send this to team.  Please fax to [phone].  Kymiah Araiza,CMA

## 2014-07-02 ENCOUNTER — Ambulatory Visit: Payer: No Typology Code available for payment source | Admitting: Family Medicine

## 2014-07-06 ENCOUNTER — Encounter: Payer: Self-pay | Admitting: *Deleted

## 2014-07-06 ENCOUNTER — Other Ambulatory Visit: Payer: Self-pay | Admitting: Family Medicine

## 2014-07-06 DIAGNOSIS — R05 Cough: Secondary | ICD-10-CM

## 2014-07-06 DIAGNOSIS — R059 Cough, unspecified: Secondary | ICD-10-CM

## 2014-07-06 NOTE — Telephone Encounter (Signed)
Pt called and said that she is still coughing and would like something called in for that. jw

## 2014-07-06 NOTE — Telephone Encounter (Signed)
Fax sent to number below stating she could go back to work on 07/03/2014. [name], Brandi Davenport

## 2014-07-08 ENCOUNTER — Telehealth: Payer: Self-pay | Admitting: Family Medicine

## 2014-07-08 MED ORDER — GUAIFENESIN-DM 100-10 MG/5ML PO SYRP: 5.0000 mL | ORAL_SOLUTION | ORAL | Status: DC | PRN

## 2014-07-08 NOTE — Telephone Encounter (Signed)
Pt is calling again about some cough medication. Can someone call her and let her if we are going to send anything in. jw

## 2014-07-08 NOTE — Telephone Encounter (Signed)
Pt informed that [facility] was called to check the status of Rx for cough medication.  Medication has be purchased over the counter.  Pt stated understanding.  Clovis PuMartin, Nataniel Gasper L, RN

## 2014-07-08 NOTE — Telephone Encounter (Signed)
Pt says [facility] doesn't have her cough medicine It shows it was received by pharmacy

## 2014-07-08 NOTE — Telephone Encounter (Signed)
Sent robitussin DM to pharm for cough.   Myra RudeJeremy E Schmitz, MD PGY-2, [facility] 07/08/2014, 9:26 AM

## 2014-07-08 NOTE — Telephone Encounter (Signed)
Spoke with pt and informed her of below. Zimmerman Rumple, April D  

## 2014-10-11 ENCOUNTER — Encounter: Payer: Self-pay | Admitting: Family Medicine

## 2014-10-11 ENCOUNTER — Ambulatory Visit (INDEPENDENT_AMBULATORY_CARE_PROVIDER_SITE_OTHER): Payer: 59 | Admitting: Family Medicine

## 2014-10-11 VITALS — BP 157/83 | HR 86 | Temp 98.5°F | Ht 67.0 in | Wt 227.0 lb

## 2014-10-11 DIAGNOSIS — F329 Major depressive disorder, single episode, unspecified: Secondary | ICD-10-CM | POA: Diagnosis not present

## 2014-10-11 DIAGNOSIS — F321 Major depressive disorder, single episode, moderate: Secondary | ICD-10-CM

## 2014-10-11 DIAGNOSIS — N398 Other specified disorders of urinary system: Secondary | ICD-10-CM | POA: Diagnosis not present

## 2014-10-11 DIAGNOSIS — F419 Anxiety disorder, unspecified: Secondary | ICD-10-CM | POA: Diagnosis not present

## 2014-10-11 DIAGNOSIS — N399 Disorder of urinary system, unspecified: Secondary | ICD-10-CM

## 2014-10-11 DIAGNOSIS — R32 Unspecified urinary incontinence: Secondary | ICD-10-CM | POA: Insufficient documentation

## 2014-10-11 DIAGNOSIS — E118 Type 2 diabetes mellitus with unspecified complications: Secondary | ICD-10-CM | POA: Diagnosis not present

## 2014-10-11 DIAGNOSIS — F32A Depression, unspecified: Secondary | ICD-10-CM

## 2014-10-11 DIAGNOSIS — F411 Generalized anxiety disorder: Secondary | ICD-10-CM

## 2014-10-11 DIAGNOSIS — R42 Dizziness and giddiness: Secondary | ICD-10-CM

## 2014-10-11 LAB — POCT URINALYSIS DIPSTICK
BILIRUBIN UA: NEGATIVE
Blood, UA: NEGATIVE
Glucose, UA: 500
KETONES UA: NEGATIVE
Leukocytes, UA: NEGATIVE
NITRITE UA: NEGATIVE
PROTEIN UA: NEGATIVE
Spec Grav, UA: >=1.03
UROBILINOGEN UA: 1
pH, UA: 6

## 2014-10-11 LAB — POCT GLYCOSYLATED HEMOGLOBIN (HGB A1C): HEMOGLOBIN A1C: 7.8

## 2014-10-11 MED ORDER — SERTRALINE HCL 50 MG PO TABS: 50.0000 mg | ORAL_TABLET | Freq: Every day | ORAL | Status: DC

## 2014-10-11 NOTE — Patient Instructions (Addendum)
Thank you for coming in,   Your Hgb A1c is improving [facility]. Please continue taking your medication and try to eat better. Please try to exercise as well.   Please schedule pap smear at the front desk for May 17 in the afternoon.   Please bring all of your medications with you to each visit.    Please feel free to call with any questions or concerns at any time, at [phone]. --Dr. Jordan LikesSchmitz  Kegel Exercises The goal of Kegel exercises is to isolate and exercise your pelvic floor muscles. These muscles act as a hammock that supports the rectum, vagina, small intestine, and uterus. As the muscles weaken, the hammock sags and these organs are displaced from their normal positions. Kegel exercises can strengthen your pelvic floor muscles and help you to improve bladder and bowel control, improve sexual response, and help reduce many problems and some discomfort during pregnancy. Kegel exercises can be done anywhere and at any time. HOW TO PERFORM KEGEL EXERCISES 1. Locate your pelvic floor muscles. To do this, squeeze (contract) the muscles that you use when you try to stop the flow of urine. You will feel a tightness in the vaginal area (women) and a tight lift in the rectal area (men and women). 2. When you begin, contract your pelvic muscles tight for 2-5 seconds, then relax them for 2-5 seconds. This is one set. Do 4-5 sets with a short pause in between. 3. Contract your pelvic muscles for 8-10 seconds, then relax them for 8-10 seconds. Do 4-5 sets. If you cannot contract your pelvic muscles for 8-10 seconds, try 5-7 seconds and work your way up to 8-10 seconds. Your goal is 4-5 sets of 10 contractions each day. Keep your stomach, buttocks, and legs relaxed during the exercises. Perform sets of both short and long contractions. Vary your positions. Perform these contractions 3-4 times per day. Perform sets while you are:   Lying in bed in the morning.  Standing at  lunch.  Sitting in the late afternoon.  Lying in bed at night. You should do 40-50 contractions per day. Do not perform more Kegel exercises per day than recommended. Overexercising can cause muscle fatigue. Continue these exercises for for at least 15-20 weeks or as directed by your caregiver. Document Released: 05/14/2012 Document Reviewed: 05/14/2012 [facility] Patient Information 2015 [employer] [place]. This information is not intended to replace advice given to you by your health care provider. Make sure you discuss any questions you have with your health care provider.

## 2014-10-11 NOTE — Assessment & Plan Note (Signed)
Unclear picture. Only occurs after she eats her [employer] lunch. Doesn't happen any other time.  Could be associated with her blood sugar.  - will have to monitor for now.

## 2014-10-11 NOTE — Assessment & Plan Note (Signed)
Reports that her anxiety is out of control and is inhibiting her to fall asleep at night.  - has been previously prescribed zoloft but never filled.  - will fill zoloft 50 mg daily  - f/u in 2-4 weeks.

## 2014-10-11 NOTE — Assessment & Plan Note (Signed)
Improvement in her A1c despite not taking full metformin and eating [employer]  - Encouraged to take regular dose of metformin  - diet and exercise  - f/u in 3 months.

## 2014-10-11 NOTE — Assessment & Plan Note (Addendum)
Presents with a unclear picture of incontinence.  - given voiding log  - kegel exercises  - f/u and determine if any improvement w/ exercises. May need to try medications or referral based on voiding log

## 2014-10-11 NOTE — Progress Notes (Signed)
Subjective:    Patient ID: Brandi Davenport, female    DOB: 1955-06-20, 59 y.o.   MRN: [id number]  HPI  Brandi Davenport is here for dm f/u and acute issues.  CHRONIC DIABETES  Disease Monitoring  Blood Sugar Ranges: n/a   Polyuria: yes, hx of    Visual problems: no   Medication Compliance: yes, but not taking glyburide   Medication Side Effects  Hypoglycemia: no    Dizziness: occurs every day at noon after she eats at [facility]. She will eat something sweet and becomes off balance. Resolves when she sits down.  Lasts for 15 mintues.  Has a lot of sweating when the episodes.   Incontinence: has been going on for about a month. Reports that she has an accident before she makes it to the bathroom. Mainly only occuring at night.  Staying the same.  No fevers, suprapubic pain or back pain.   Anxiety: has been diagnosed with depression but now reports excessive worry about things. She will go to bed at 8pm and wake up at midnight and then be unable to fall asleep due to worry.  She knows that she has excessive worry but is unable to control it.  She has been prescribed zoloft in the past but unable to fill it. No indication of SI.   Current Outpatient Prescriptions on File Prior to Visit  Medication Sig Dispense Refill  . acetaminophen (TYLENOL) 650 MG CR tablet Take 2 tablets (1,300 mg total) by mouth 2 (two) times daily. 60 tablet 3  . aspirin 81 MG EC tablet Take 81 mg by mouth daily.      . cyclobenzaprine (FLEXERIL) 10 MG tablet Take 0.5 tablets (5 mg total) by mouth at bedtime as needed for muscle spasms. 30 tablet 0  . fluticasone (FLONASE) 50 MCG/ACT nasal spray Place 2 sprays into both nostrils daily. 16 g 6  . glimepiride (AMARYL) 2 MG tablet Take 1 tablet (2 mg total) by mouth daily before breakfast. 30 tablet 3  . guaiFENesin-dextromethorphan (ROBITUSSIN DM) 100-10 MG/5ML syrup Take 5 mLs by mouth every 4 (four) hours as needed for cough. 118 mL 0  . lisinopril  (PRINIVIL,ZESTRIL) 10 MG tablet Take 1 tablet (10 mg total) by mouth daily. 90 tablet 3  . lisinopril-hydrochlorothiazide (PRINZIDE,ZESTORETIC) 20-12.5 MG per tablet Take 1 tablet by mouth daily. For blood pressure 93 tablet 5  . lovastatin (MEVACOR) 40 MG tablet Take 1 tablet (40 mg total) by mouth at bedtime. 30 tablet 3  . metFORMIN (GLUCOPHAGE XR) 500 MG 24 hr tablet Take 1 tablet (500 mg total) by mouth daily with breakfast. 90 tablet 1  . nitroGLYCERIN (NITROSTAT) 0.4 MG SL tablet Place 1 tablet (0.4 mg total) under the tongue every 5 (five) minutes as needed for chest pain. 100 tablet 3  . ondansetron (ZOFRAN-ODT) 4 MG disintegrating tablet Take 1 tablet (4 mg total) by mouth every 8 (eight) hours as needed for nausea or vomiting. 20 tablet 0  . ranitidine (ZANTAC) 150 MG tablet Take 1 tablet (150 mg total) by mouth 2 (two) times daily. 60 tablet 1  . sertraline (ZOLOFT) 50 MG tablet Take 1 tablet (50 mg total) by mouth daily. 30 tablet 3   No current facility-administered medications on file prior to visit.    SHx: no smoking or EtOH   Health Maintenance: needs colonoscopy and pap smear    Review of Systems See HPI     Objective:   Physical Exam BP 157/83 mmHg  Pulse 86  Temp(Src) 98.5 F (36.9 C) (Oral)  Ht 5\' 7"  (1.702 m)  Wt 227 lb (102.967 kg)  BMI 35.55 kg/m2 Gen: NAD, alert, cooperative with exam, well-appearing CV: RRR, good S1/S2, no murmur, no edema, capillary refill brisk  Resp: CTABL, no wheezes, non-labored Skin: no rashes, normal turgor  Neuro: no gross deficits.   GAD-7: 13 PHQ-9: 4      Assessment & Plan:

## 2014-10-12 LAB — MICROALBUMIN, URINE: Microalb, Ur: 0.4 mg/dL (ref <2.0)

## 2014-10-26 ENCOUNTER — Ambulatory Visit: Payer: 59 | Admitting: Family Medicine

## 2014-11-19 ENCOUNTER — Ambulatory Visit: Payer: 59 | Admitting: Family Medicine

## 2014-11-22 ENCOUNTER — Other Ambulatory Visit: Payer: Self-pay | Admitting: Family Medicine

## 2014-11-22 DIAGNOSIS — I10 Essential (primary) hypertension: Secondary | ICD-10-CM

## 2015-01-18 ENCOUNTER — Encounter: Payer: Self-pay | Admitting: Family Medicine

## 2015-01-18 ENCOUNTER — Ambulatory Visit (INDEPENDENT_AMBULATORY_CARE_PROVIDER_SITE_OTHER): Payer: 59 | Admitting: Family Medicine

## 2015-01-18 VITALS — BP 144/80 | HR 88 | Temp 98.2°F | Wt 235.0 lb

## 2015-01-18 DIAGNOSIS — E118 Type 2 diabetes mellitus with unspecified complications: Secondary | ICD-10-CM | POA: Diagnosis not present

## 2015-01-18 DIAGNOSIS — R519 Headache, unspecified: Secondary | ICD-10-CM

## 2015-01-18 DIAGNOSIS — R51 Headache: Secondary | ICD-10-CM

## 2015-01-18 DIAGNOSIS — R7309 Other abnormal glucose: Secondary | ICD-10-CM

## 2015-01-18 LAB — POCT SEDIMENTATION RATE: POCT SED RATE: 28 mm/h — AB (ref 0–22)

## 2015-01-18 LAB — GLUCOSE, CAPILLARY: Glucose-Capillary: 188 mg/dL — ABNORMAL HIGH (ref 65–99)

## 2015-01-18 LAB — POCT GLYCOSYLATED HEMOGLOBIN (HGB A1C): Hemoglobin A1C: 9.3

## 2015-01-18 MED ORDER — GLIMEPIRIDE 2 MG PO TABS: 2.0000 mg | ORAL_TABLET | Freq: Every day | ORAL | Status: DC

## 2015-01-18 NOTE — Assessment & Plan Note (Signed)
A1c significantly elevated over the last 3 months to 9.3. Patient continues to take metformin wrong. Also never started taking Amaryl. Discussed that Amaryl should be on the $10 list at [facility], and if needed we can prescribe a different dosage if that is the issue. Despite reported elevated CBGs, POC CBG of 188 not incredibly concerning in clinic today. No N/V or abdominal pain. - dicussed not cutting the XR tablets of metformin - advised pt to pick up amaryl from pharmacy and contact our office if there are issues instead of just not taking it - patient urged to follow up closely with her PCP.

## 2015-01-18 NOTE — Assessment & Plan Note (Signed)
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 Fortunately, ESR was only mildly elevated (28) and stable from previous checks. Watery eyes are c/w cluster headaches but odd that she has no other symptoms.  Given the cyclic pattern of her headaches, this is most likely a rebound headaches. No neurologic deficits noted on exam. - discussed results of ESR to patient - discussed rebound headaches, pt to avoid all analgesics for the next few days. - RTC precautions discussed: worsening or changing headache, vision changes, unilateral weakness, paresthesias, or slurred speech.

## 2015-01-18 NOTE — Progress Notes (Signed)
Patient ID: Brandi Davenport, female   DOB: 08/09/1955, 58 y.o.   MRN: [id number]     Subjective: [id number] HPI: Patient is a 59 y.o. female with a past medical history of T2DM, HTN, depression, anxiety, and GERD presenting to clinic today in [place] for concerns of hyperglycemia with additional complaints of headache, dizziness, and diaphoresis.   Hyperglycemia: Patient taking CBGs twice daily over the last week as she's been feeling under the weather (headaches, dizziness, diaphoresis: see below). Her CBGs have been between 297 and 233. Endorses polyuria but notes increase water intake, 3-4 20oz bottles of water per day (increased over the last few days, was drinking sweet tea previously). Also notes noturia, waking up ~4x/night. No N/V, abdominal pain, paresthesias, or vision changes.  She's prescribed metformin XR 567m but states she's been cutting it half since she started it due to GI upset). She never got Amaryl as she stated it was >$90.    Headache, dizziness, diaphoresis: She started feeling really bad approximately 1 week ago when she started having headaches/dizziness.  When she gets up and starts walking, she gets dizzy. She states this is new, however it was noted along with diaphoresis at her last appointment. When mentioned, the states the dizziness may have been going on for longer than 1 week, however notes the headaches has been present x 1 week. Located in the temples bilaterally, pulsatile in nature.  She gets these headaches every day- she takes Tylenol and the headache resolves for the rest of the day  She then notices the headache will return the subsequent day, however notes it is not present on waking up.  Endorses watery eyes. Denies runny nose, nasal congestion, cough, facial pain, photophobia, sonophobia, change in sensation, weakness, or slurred speech. Vision stable.   Of note, she also self-discontinued the Zoloft a month ago because she didn't like the way it made  her feel    Social History: never smoker  ROS: All other systems reviewed and are negative.  Past Medical History Patient Active Problem List   Diagnosis Date Noted  . Headache 01/18/2015  . Generalized anxiety disorder 10/11/2014  . Incontinence 10/11/2014  . Dizziness 10/11/2014  . Mass 06/18/2014  . Major depressive disorder, single episode 01/10/2010  . GERD 02/25/2008  . HYPERLIPIDEMIA 03/13/2007  . DM type 2 (diabetes mellitus, type 2) 08/08/2006  . HYPERTENSION, BENIGN SYSTEMIC 08/08/2006    Medications- reviewed and updated Current Outpatient Prescriptions  Medication Sig Dispense Refill  . acetaminophen (TYLENOL) 650 MG CR tablet Take 2 tablets (1,300 mg total) by mouth 2 (two) times daily. 60 tablet 3  . aspirin 81 MG EC tablet Take 81 mg by mouth daily.      . cyclobenzaprine (FLEXERIL) 10 MG tablet Take 0.5 tablets (5 mg total) by mouth at bedtime as needed for muscle spasms. 30 tablet 0  . fluticasone (FLONASE) 50 MCG/ACT nasal spray Place 2 sprays into both nostrils daily. 16 g 6  . glimepiride (AMARYL) 2 MG tablet Take 1 tablet (2 mg total) by mouth daily before breakfast. 30 tablet 3  . guaiFENesin-dextromethorphan (ROBITUSSIN DM) 100-10 MG/5ML syrup Take 5 mLs by mouth every 4 (four) hours as needed for cough. 118 mL 0  . lisinopril (PRINIVIL,ZESTRIL) 10 MG tablet TAKE ONE TABLET BY MOUTH ONCE DAILY 90 tablet 1  . lovastatin (MEVACOR) 40 MG tablet Take 1 tablet (40 mg total) by mouth at bedtime. 30 tablet 3  . metFORMIN (GLUCOPHAGE XR) 500 MG 24 hr tablet  Take 1 tablet (500 mg total) by mouth daily with breakfast. 90 tablet 1  . nitroGLYCERIN (NITROSTAT) 0.4 MG SL tablet Place 1 tablet (0.4 mg total) under the tongue every 5 (five) minutes as needed for chest pain. 100 tablet 3  . ondansetron (ZOFRAN-ODT) 4 MG disintegrating tablet Take 1 tablet (4 mg total) by mouth every 8 (eight) hours as needed for nausea or vomiting. 20 tablet 0  . ranitidine (ZANTAC) 150 MG  tablet Take 1 tablet (150 mg total) by mouth 2 (two) times daily. 60 tablet 1  . sertraline (ZOLOFT) 50 MG tablet Take 1 tablet (50 mg total) by mouth daily. (Patient not taking: Reported on 01/18/2015) 30 tablet 3   No current facility-administered medications for this visit.    Objective: Office vital signs reviewed. BP 144/80 mmHg  Pulse 88  Temp(Src) 98.2 F (36.8 C) (Oral)  Wt 235 lb (106.595 kg)   Physical Examination:  General: Awake, alert, well- nourished, initially sitting in the dark, but NAD with turning the lights on.  ENMT:  TMs intact, normal light reflex, no erythema, no bulging. Nasal turbinates moist. MMM, Oropharynx clear without erythema or tonsillar exudate/hypertrophy. Patient noted to be exquistely TTP over the temporal regions bilaterally. No frontal or maxillary pain with palpation.  Eyes: Conjunctiva non-injected. PERRL.  Cardio: RRR, no m/r/g noted.  Pulm: No increased WOB.  CTAB, without wheezes, rhonchi or crackles noted.  GI: soft, NT/ND,+BS x4 Neuro: Speech clear. Strength and sensation grossly intact, DTRs 2/4  Assessment/Plan: DM type 2 (diabetes mellitus, type 2) A1c significantly elevated over the last 3 months to 9.3. Patient continues to take metformin wrong. Also never started taking Amaryl. Discussed that Amaryl should be on the $10 list at [facility], and if needed we can prescribe a different dosage if that is the issue. Despite reported elevated CBGs, POC CBG of 188 not incredibly concerning in clinic today. No N/V or abdominal pain. - dicussed not cutting the XR tablets of metformin - advised pt to pick up amaryl from pharmacy and contact our office if there are issues instead of just not taking it - patient urged to follow up closely with her PCP.    Headache Given concerns for exquisite TTP over the temporal regions bilaterally, there was a small concern for temporal arteritis (however odd that it was bilateral, resolved with Tylenol, and lacked  fevers or vision changes). Fortunately, ESR was only mildly elevated (28) and stable from previous checks. Watery eyes are c/w cluster headaches but odd that she has no other symptoms.  Given the cyclic pattern of her headaches, this is most likely a rebound headaches. No neurologic deficits noted on exam. - discussed results of ESR to patient - discussed rebound headaches, pt to avoid all analgesics for the next few days. - RTC precautions discussed: worsening or changing headache, vision changes, unilateral weakness, paresthesias, or slurred speech.     Orders Placed This Encounter  Procedures  . Glucose, capillary  . POCT A1C  . POCT CBG (Fasting - Glucose)  . POCT SEDIMENTATION RATE    Meds ordered this encounter  Medications  . glimepiride (AMARYL) 2 MG tablet    Sig: Take 1 tablet (2 mg total) by mouth daily before breakfast.    Dispense:  30 tablet    Refill:  [place] PGY-2, [place]

## 2015-01-18 NOTE — Patient Instructions (Signed)
I will call you with the lab result.  I think your headache is from taking too much Tylenol regularly. Try to stop taking this for the next 2 days. If you notice weakness, slurred speech, change in speech, or change vision, please seek medical care. Do not take 1/2 a tablet of the metformin, it's not meant to but cut in half. Please fill the Amaryl for your blood sugars.  Follow up with Dr. Jordan Likes at the end of this week or next week.

## 2015-01-26 ENCOUNTER — Encounter: Payer: Self-pay | Admitting: Family Medicine

## 2015-01-26 ENCOUNTER — Ambulatory Visit (INDEPENDENT_AMBULATORY_CARE_PROVIDER_SITE_OTHER): Payer: 59 | Admitting: Family Medicine

## 2015-01-26 ENCOUNTER — Other Ambulatory Visit: Payer: Self-pay

## 2015-01-26 VITALS — BP 129/77 | HR 98 | Temp 98.5°F | Ht 67.0 in | Wt 233.2 lb

## 2015-01-26 DIAGNOSIS — E118 Type 2 diabetes mellitus with unspecified complications: Secondary | ICD-10-CM | POA: Diagnosis not present

## 2015-01-26 DIAGNOSIS — Z1211 Encounter for screening for malignant neoplasm of colon: Secondary | ICD-10-CM

## 2015-01-26 DIAGNOSIS — Z1231 Encounter for screening mammogram for malignant neoplasm of breast: Secondary | ICD-10-CM

## 2015-01-26 MED ORDER — ONETOUCH ULTRASOFT LANCETS MISC: Status: AC

## 2015-01-26 MED ORDER — ONETOUCH ULTRA 2 W/DEVICE KIT: 2.0000 mg | PACK | Freq: Every day | Status: AC

## 2015-01-26 MED ORDER — GLUCOSE BLOOD VI STRP: ORAL_STRIP | Status: AC

## 2015-01-26 NOTE — Patient Instructions (Addendum)
Thank you for coming in,   Please failure prescriptions and take appropriately.  Please measure your blood sugar and bring those measurements with you to your next office visit.  Please follow-up with me in 3 months for your diabetes.  Please bring all of your medications with you to each visit.    Please feel free to call with any questions or concerns at any time, at [phone]. --Dr. Jordan Likes  Diet Recommendations for Diabetes   Starchy (carb) foods include: Bread, rice, pasta, potatoes, corn, crackers, bagels, muffins, all baked goods.   Protein foods include: Meat, fish, poultry, eggs, dairy foods, and beans such as pinto and kidney beans (beans also provide carbohydrate).   1. Eat at least 3 meals and 1-2 snacks per day. Never go more than 4-5 hours while awake without eating.  2. Limit starchy foods to TWO per meal and ONE per snack. ONE portion of a starchy  food is equal to the following:   - ONE slice of bread (or its equivalent, such as half of a hamburger bun).   - 1/2 cup of a "scoopable" starchy food such as potatoes or rice.   - 1 OUNCE (28 grams) of starchy snack foods such as crackers or pretzels (look on label).   - 15 grams of carbohydrate as shown on food label.  3. Both lunch and dinner should include a protein food, a carb food, and vegetables.   - Obtain twice as many veg's as protein or carbohydrate foods for both lunch and dinner.   - Try to keep frozen veg's on hand for a quick vegetable serving.     - Fresh or frozen veg's are best.  4. Breakfast should always include protein.

## 2015-01-26 NOTE — Progress Notes (Signed)
Subjective:    Ceriah Davenport - 59 y.o. female MRN [id number]  Date of birth: 11-Aug-1955  HPI  Brandi Davenport is here for DM2.   She was seen recently and found to be hyperglycemia.  She has a hx of being non compliant or taking medications differently.  She reports being compliant since that appt.  She was referred to optho in May but missed/no showed for her appt.  .  Health Maintenance:   Health Maintenance Due  Topic Date Due  . Hepatitis C Screening  1956-04-14  . HIV Screening  08/08/1972  . COLONOSCOPY  08/09/2007  . PAP SMEAR  03/20/2011  . TETANUS/TDAP  07/13/2011  . MAMMOGRAM  09/25/2012  . OPHTHALMOLOGY EXAM  09/17/2013  . FOOT EXAM  08/28/2014  . INFLUENZA VACCINE  01/10/2015    -  reports that she has never smoked. She does not have any smokeless tobacco history on file. - Review of Systems: Per HPI. All other systems reviewed and are negative. - Past Medical History: Patient Active Problem List   Diagnosis Date Noted  . Headache 01/18/2015  . Generalized anxiety disorder 10/11/2014  . Incontinence 10/11/2014  . Dizziness 10/11/2014  . Mass 06/18/2014  . Major depressive disorder, single episode 01/10/2010  . GERD 02/25/2008  . HYPERLIPIDEMIA 03/13/2007  . DM type 2 (diabetes mellitus, type 2) 08/08/2006  . HYPERTENSION, BENIGN SYSTEMIC 08/08/2006   - Medications: reviewed and updated Current Outpatient Prescriptions  Medication Sig Dispense Refill  . acetaminophen (TYLENOL) 650 MG CR tablet Take 2 tablets (1,300 mg total) by mouth 2 (two) times daily. 60 tablet 3  . aspirin 81 MG EC tablet Take 81 mg by mouth daily.      . cyclobenzaprine (FLEXERIL) 10 MG tablet Take 0.5 tablets (5 mg total) by mouth at bedtime as needed for muscle spasms. 30 tablet 0  . fluticasone (FLONASE) 50 MCG/ACT nasal spray Place 2 sprays into both nostrils daily. 16 g 6  . glimepiride (AMARYL) 2 MG tablet Take 1 tablet (2 mg total) by mouth daily before breakfast. 30  tablet 3  . guaiFENesin-dextromethorphan (ROBITUSSIN DM) 100-10 MG/5ML syrup Take 5 mLs by mouth every 4 (four) hours as needed for cough. 118 mL 0  . lisinopril (PRINIVIL,ZESTRIL) 10 MG tablet TAKE ONE TABLET BY MOUTH ONCE DAILY 90 tablet 1  . lovastatin (MEVACOR) 40 MG tablet Take 1 tablet (40 mg total) by mouth at bedtime. 30 tablet 3  . metFORMIN (GLUCOPHAGE XR) 500 MG 24 hr tablet Take 1 tablet (500 mg total) by mouth daily with breakfast. 90 tablet 1  . nitroGLYCERIN (NITROSTAT) 0.4 MG SL tablet Place 1 tablet (0.4 mg total) under the tongue every 5 (five) minutes as needed for chest pain. 100 tablet 3  . ondansetron (ZOFRAN-ODT) 4 MG disintegrating tablet Take 1 tablet (4 mg total) by mouth every 8 (eight) hours as needed for nausea or vomiting. 20 tablet 0  . ranitidine (ZANTAC) 150 MG tablet Take 1 tablet (150 mg total) by mouth 2 (two) times daily. 60 tablet 1  . sertraline (ZOLOFT) 50 MG tablet Take 1 tablet (50 mg total) by mouth daily. (Patient not taking: Reported on 01/18/2015) 30 tablet 3   No current facility-administered medications for this visit.     Review of Systems See HPI     Objective:   Physical Exam BP 129/77 mmHg  Pulse 98  Temp(Src) 98.5 F (36.9 C) (Oral)  Ht 5\' 7"  (1.702 m)  Wt 233 lb  3.2 oz (105.779 kg)  BMI 36.52 kg/m2 Gen: NAD, alert, cooperative with exam,  CV: RRR, good S1/S2, no murmur, no edema, capillary refill brisk  Resp: CTABL, no wheezes, non-labored Skin: no rashes, normal turgor  Neuro: no gross deficits.    Assessment & Plan:

## 2015-01-29 NOTE — Assessment & Plan Note (Signed)
She reports taking medication now  Taking Amaryl and metformin QOD 2/2 GI upset  Sugars range from 103-168  - refilled diabetic supplies  - referral to optho  - she will f/u in three months

## 2015-01-31 ENCOUNTER — Ambulatory Visit: Payer: 59

## 2015-01-31 ENCOUNTER — Ambulatory Visit
Admission: RE | Admit: 2015-01-31 | Discharge: 2015-01-31 | Disposition: A | Discharge status: Home / Self Care | Payer: 59 | Source: Ambulatory Visit

## 2015-01-31 DIAGNOSIS — Z1231 Encounter for screening mammogram for malignant neoplasm of breast: Secondary | ICD-10-CM

## 2015-02-11 LAB — HM DIABETES EYE EXAM

## 2015-03-03 ENCOUNTER — Other Ambulatory Visit: Payer: Self-pay | Admitting: Family Medicine

## 2015-03-03 ENCOUNTER — Telehealth: Payer: Self-pay | Admitting: Family Medicine

## 2015-03-03 NOTE — Telephone Encounter (Signed)
[name] called to ask for something to help her sleep at night.  Can send to [facility] on [address].

## 2015-03-03 NOTE — Telephone Encounter (Signed)
Pt called and needs a refill on her Metformin called in. jw °

## 2015-03-04 NOTE — Telephone Encounter (Signed)
Spoke with patient about needing something for sleep. Advised that she try melatonin first and if that doesn't work then she will schedule an appt with me.   Myra Rude, MD PGY-3, [facility] 03/04/2015, 11:59 AM

## 2015-04-01 ENCOUNTER — Ambulatory Visit (INDEPENDENT_AMBULATORY_CARE_PROVIDER_SITE_OTHER): Payer: 59 | Admitting: Family Medicine

## 2015-04-01 VITALS — BP 140/85 | Temp 98.4°F | Wt 234.6 lb

## 2015-04-01 DIAGNOSIS — R51 Headache: Secondary | ICD-10-CM

## 2015-04-01 DIAGNOSIS — R519 Headache, unspecified: Secondary | ICD-10-CM

## 2015-04-01 MED ORDER — KETOROLAC TROMETHAMINE 60 MG/2ML IM SOLN
60.0000 mg | Freq: Once | INTRAMUSCULAR | Status: AC
  Administered 2015-04-01: 60 mg via INTRAMUSCULAR

## 2015-04-01 NOTE — Progress Notes (Signed)
HEADACHE  States she has had these symptoms in the past (~1-2119yrs ago). Did not receive medication for this for prior episodes.   Onset: since tuesday Location: frontal and occipital bilateral Quality: 8/10 Frequency: constant Precipitating factors: high BP Prior treatment: tylenol  Associated Symptoms Nausea/vomiting: yes, just nausea  Photophobia/phonophobia: yes, only photo  Tearing of eyes: yes  Sinus pain/pressure: no  Family hx migraine: no  Personal stressors: no  Relation to menstrual cycle: no   Red Flags Fever: no  Neck pain/stiffness: posterior stiffness  Vision/speech/swallow/hearing difficulty: no  Focal weakness/numbness: no  Altered mental status: no  Trauma: no  New type of headache: no  Anticoagulant use: no  H/o cancer/HIV/Pregnancy: no   Objective: BP 140/85 mmHg  Temp(Src) 98.4 F (36.9 C) (Oral)  Wt 234 lb 9.6 oz (106.414 kg) Gen: NAD, alert, cooperative, occasionally shielding eyes from light HEENT: NCAT, EOMI, PERRL CV: RRR, no murmur Resp: CTAB, no wheezes, non-labored Ext: minimal (+0-1) edema bilaterally, warm, DTRs symmetric bilaterally/throughout Neuro: Alert and oriented, Speech clear, No gross deficits  Assessment and plan:  Headache Patient currently complaining of headache. Findings most consistent with headache versus migraine without aura. No significant red flags at this time. Patient's blood pressure currently elevated from baseline but is still only mildly hypertensive. Patient reported good compliance with her medications today. - Toradol IM 60 mg provided in the office today. Unable to provide Benadryl/Reglan IM as well due to the fact that patient drove herself to the clinic. - Patient left before I could recheck symptom control after Toradol shot because I was unwilling to provide a letter for work to excuse her from her duties this coming Monday. - F/u PRN   Meds ordered this encounter  Medications  . ketorolac (TORADOL)  injection 60 mg    Sig:      Kathee DeltonIan D McKeag, MD,MS,  PGY2 04/02/2015 12:57 PM

## 2015-04-01 NOTE — Patient Instructions (Signed)
It was a pleasure seeing you today in our clinic. Today we discussed your headache. Here is the treatment plan we have discussed and agreed upon together:   - Continue taking your blood pressure medications as prescribed. - Try to get some good rest tonight and over the weekend. - Avoid loud crowded areas that may make this headache worse. - Stay well hydrated and avoid alcohol consumption. However, some caffeine consumption may help relieve some of this headache. - Follow-up in 1-2 weeks if this pain persists. - Seek medical care over the weekend if these symptoms worsen or you notice issues with dizziness, walking, confusion, vision changes, or speech problems.

## 2015-04-02 NOTE — Assessment & Plan Note (Signed)
Patient currently complaining of headache. Findings most consistent with headache versus migraine without aura. No significant red flags at this time. Patient's blood pressure currently elevated from baseline but is still only mildly hypertensive. Patient reported good compliance with her medications today. - Toradol IM 60 mg provided in the office today. Unable to provide Benadryl/Reglan IM as well due to the fact that patient drove herself to the clinic. - Patient left before I could recheck symptom control after Toradol shot because I was unwilling to provide a letter for work to excuse her from her duties this coming Monday. - F/u PRN

## 2015-05-09 ENCOUNTER — Other Ambulatory Visit: Payer: Self-pay | Admitting: *Deleted

## 2015-05-09 DIAGNOSIS — I10 Essential (primary) hypertension: Secondary | ICD-10-CM

## 2015-05-09 MED ORDER — LISINOPRIL 10 MG PO TABS: 10.0000 mg | ORAL_TABLET | Freq: Every day | ORAL | Status: DC

## 2015-05-11 ENCOUNTER — Ambulatory Visit (INDEPENDENT_AMBULATORY_CARE_PROVIDER_SITE_OTHER): Payer: 59 | Admitting: Family Medicine

## 2015-05-11 ENCOUNTER — Encounter: Payer: Self-pay | Admitting: Family Medicine

## 2015-05-11 VITALS — BP 156/74 | HR 90 | Temp 98.1°F | Ht 67.0 in | Wt 236.9 lb

## 2015-05-11 DIAGNOSIS — Z23 Encounter for immunization: Secondary | ICD-10-CM

## 2015-05-11 DIAGNOSIS — E118 Type 2 diabetes mellitus with unspecified complications: Secondary | ICD-10-CM

## 2015-05-11 DIAGNOSIS — F4321 Adjustment disorder with depressed mood: Secondary | ICD-10-CM

## 2015-05-11 DIAGNOSIS — D509 Iron deficiency anemia, unspecified: Secondary | ICD-10-CM

## 2015-05-11 DIAGNOSIS — I10 Essential (primary) hypertension: Secondary | ICD-10-CM | POA: Diagnosis not present

## 2015-05-11 DIAGNOSIS — R202 Paresthesia of skin: Secondary | ICD-10-CM

## 2015-05-11 DIAGNOSIS — R42 Dizziness and giddiness: Secondary | ICD-10-CM

## 2015-05-11 DIAGNOSIS — Z1159 Encounter for screening for other viral diseases: Secondary | ICD-10-CM

## 2015-05-11 LAB — COMPLETE METABOLIC PANEL WITH GFR
ALBUMIN: 3.8 g/dL (ref 3.6–5.1)
ALK PHOS: 51 U/L (ref 33–130)
ALT: 11 U/L (ref 6–29)
AST: 12 U/L (ref 10–35)
BILIRUBIN TOTAL: 0.5 mg/dL (ref 0.2–1.2)
BUN: 17 mg/dL (ref 7–25)
CALCIUM: 8.9 mg/dL (ref 8.6–10.4)
CO2: 28 mmol/L (ref 20–31)
CREATININE: 0.7 mg/dL (ref 0.50–1.05)
Chloride: 103 mmol/L (ref 98–110)
GFR, Est African American: >89 mL/min (ref >=60)
GFR, Est Non African American: >89 mL/min (ref >=60)
Glucose, Bld: 112 mg/dL — ABNORMAL HIGH (ref 65–99)
Potassium: 3.8 mmol/L (ref 3.5–5.3)
Sodium: 139 mmol/L (ref 135–146)
TOTAL PROTEIN: 7.4 g/dL (ref 6.1–8.1)

## 2015-05-11 LAB — LIPID PANEL
CHOLESTEROL: 159 mg/dL (ref 125–200)
HDL: 55 mg/dL (ref >=46)
LDL Cholesterol: 89 mg/dL (ref <130)
Total CHOL/HDL Ratio: 2.9 Ratio (ref <=5.0)
Triglycerides: 77 mg/dL (ref <150)
VLDL: 15 mg/dL (ref <30)

## 2015-05-11 LAB — ANEMIA PANEL 7
%SAT: 10 % — AB (ref 11–50)
ABS RETIC: 34.8 10*3/uL (ref 19.0–186.0)
FOLATE: 6.1 ng/mL
Ferritin: 51 ng/mL (ref 10–291)
HCT: 35.1 % — ABNORMAL LOW (ref 36.0–46.0)
HEMOGLOBIN: 11.4 g/dL — AB (ref 12.0–15.0)
IRON: 34 ug/dL — AB (ref 45–160)
MCH: 26.2 pg (ref 26.0–34.0)
MCHC: 32.5 g/dL (ref 30.0–36.0)
MCV: 80.7 fL (ref 78.0–100.0)
MPV: 9.6 fL (ref 8.6–12.4)
PLATELETS: 299 10*3/uL (ref 150–400)
RBC.: 4.35 MIL/uL (ref 3.87–5.11)
RBC: 4.35 MIL/uL (ref 3.87–5.11)
RDW: 14.2 % (ref 11.5–15.5)
RETIC CT PCT: 0.8 % (ref 0.4–2.3)
TIBC: 352 ug/dL (ref 250–450)
UIBC: 318 ug/dL (ref 125–400)
VITAMIN B 12: 591 pg/mL (ref 211–911)
WBC: 6.8 10*3/uL (ref 4.0–10.5)

## 2015-05-11 LAB — POCT GLYCOSYLATED HEMOGLOBIN (HGB A1C): HEMOGLOBIN A1C: 6.3

## 2015-05-11 LAB — TSH: TSH: 1.413 u[IU]/mL (ref 0.350–4.500)

## 2015-05-11 MED ORDER — GLIMEPIRIDE 2 MG PO TABS: 2.0000 mg | ORAL_TABLET | Freq: Every day | ORAL | Status: DC

## 2015-05-11 MED ORDER — MECLIZINE HCL 32 MG PO TABS: 32.0000 mg | ORAL_TABLET | Freq: Three times a day (TID) | ORAL | Status: DC | PRN

## 2015-05-11 MED ORDER — LISINOPRIL 10 MG PO TABS: 10.0000 mg | ORAL_TABLET | Freq: Every day | ORAL | Status: DC

## 2015-05-11 NOTE — Patient Instructions (Signed)
Thank you for coming in,   I will call or send a letter with the results from today.   Please bring all of your medications with you to each visit.   Sign up for My Chart to have easy access to your labs results, and communication with your Primary care physician   Please feel free to call with any questions or concerns at any time, at [phone]. --Dr. Jordan LikesSchmitz Benign Positional Vertigo Vertigo is the feeling that you or your surroundings are moving when they are not. Benign positional vertigo is the most common form of vertigo. The cause of this condition is not serious (is benign). This condition is triggered by certain movements and positions (is positional). This condition can be dangerous if it occurs while you are doing something that could endanger you or others, such as driving.  CAUSES In many cases, the cause of this condition is not known. It may be caused by a disturbance in an area of the inner ear that helps your brain to sense movement and balance. This disturbance can be caused by a viral infection (labyrinthitis), head injury, or repetitive motion. RISK FACTORS This condition is more likely to develop in:  Women.  People who are 57 years of age or older. SYMPTOMS Symptoms of this condition usually happen when you move your head or your eyes in different directions. Symptoms may start suddenly, and they usually last for less than a minute. Symptoms may include:  Loss of balance and falling.  Feeling like you are spinning or moving.  Feeling like your surroundings are spinning or moving.  Nausea and vomiting.  Blurred vision.  Dizziness.  Involuntary eye movement (nystagmus). Symptoms can be mild and cause only slight annoyance, or they can be severe and interfere with daily life. Episodes of benign positional vertigo may return (recur) over time, and they may be triggered by certain movements. Symptoms may improve over time. DIAGNOSIS This condition is usually  diagnosed by medical history and a physical exam of the head, neck, and ears. You may be referred to a health care provider who specializes in ear, nose, and throat (ENT) problems (otolaryngologist) or a provider who specializes in disorders of the nervous system (neurologist). You may have additional testing, including:  MRI.  A CT scan.  Eye movement tests. Your health care provider may ask you to change positions quickly while he or she watches you for symptoms of benign positional vertigo, such as nystagmus. Eye movement may be tested with an electronystagmogram (ENG), caloric stimulation, the Dix-Hallpike test, or the roll test.  An electroencephalogram (EEG). This records electrical activity in your brain.  Hearing tests. TREATMENT Usually, your health care provider will treat this by moving your head in specific positions to adjust your inner ear back to normal. Surgery may be needed in severe cases, but this is rare. In some cases, benign positional vertigo may resolve on its own in 2-4 weeks. HOME CARE INSTRUCTIONS Safety  Move slowly.Avoid sudden body or head movements.  Avoid driving.  Avoid operating heavy machinery.  Avoid doing any tasks that would be dangerous to you or others if a vertigo episode would occur.  If you have trouble walking or keeping your balance, try using a cane for stability. If you feel dizzy or unstable, sit down right away.  Return to your normal activities as told by your health care provider. Ask your health care provider what activities are safe for you. General Instructions  Take over-the-counter and prescription  medicines only as told by your health care provider.  Avoid certain positions or movements as told by your health care provider.  Drink enough fluid to keep your urine clear or pale yellow.  Keep all follow-up visits as told by your health care provider. This is important. SEEK MEDICAL CARE IF:  You have a fever.  Your  condition gets worse or you develop new symptoms.  Your family or friends notice any behavioral changes.  Your nausea or vomiting gets worse.  You have numbness or a "pins and needles" sensation. SEEK IMMEDIATE MEDICAL CARE IF:  You have difficulty speaking or moving.  You are always dizzy.  You faint.  You develop severe headaches.  You have weakness in your legs or arms.  You have changes in your hearing or vision.  You develop a stiff neck.  You develop sensitivity to light.   This information is not intended to replace advice given to you by your health care provider. Make sure you discuss any questions you have with your health care provider.   Document Released: 03/05/2006 Document Revised: 02/16/2015 Document Reviewed: 09/20/2014 Elsevier Interactive Patient Education [employer]

## 2015-05-11 NOTE — Progress Notes (Signed)
Subjective:    Brandi Davenport - 59 y.o. female MRN [id number]  Date of birth: December 04, 1955  HPI  Brandi Davenport is here for dizziness.  Right hand pin and needles:   Occuring for one month  Worse at night and in the morning  Does housekeeping work  Feels like pins and needles.  Staying the same  Feels like her hand in asleep.   DIZZINESS  She can't bend over too fast or else she gets lightheaded.  Has been occuring for one month.  Sometimes she has to placed a cold towel on her head  Feels like room spins: yes  Lightheadedness when stands: yes Palpitations or heart racing: yes  Prior dizziness: no  Medications tried: no Taking blood thinners: no  Symptoms Hearing Loss: no Ear Pain or fullness: yes Nausea or vomiting: yes Vision difficulty or double vision: no Falls: no Head trauma: no Weakness in arm or leg: no Speaking problems: no Headache: no  Grief reaction: Her son was recently least in prison He violated his probation and sentence to 2 years in prison. He was placed in prison in [place], [place] and this causes her much distress She feels that she is unable to drive out that for on a regular basis to visit him She is not to drive with one of her grandchildren and have anything happen to them with her driving  Health Maintenance:  Hep C screening today Flu vaccine administered Health Maintenance Due  Topic Date Due  . Hepatitis C Screening  1955/09/28  . HIV Screening  08/08/1972  . COLONOSCOPY  08/09/2007  . PAP SMEAR  03/20/2011  . TETANUS/TDAP  07/13/2011  . FOOT EXAM  08/28/2014    -  reports that she has never smoked. She does not have any smokeless tobacco history on file. - Review of Systems: Per HPI. - Past Medical History: Patient Active Problem List   Diagnosis Date Noted  . Right hand paresthesia 05/12/2015  . Grief reaction 05/12/2015  . Headache 01/18/2015  . Generalized anxiety disorder 10/11/2014  . Incontinence  10/11/2014  . Dizziness 10/11/2014  . Mass 06/18/2014  . Major depressive disorder, single episode 01/10/2010  . GERD 02/25/2008  . HYPERLIPIDEMIA 03/13/2007  . DM type 2 (diabetes mellitus, type 2) ([place]) 08/08/2006  . HYPERTENSION, BENIGN SYSTEMIC 08/08/2006   - Medications: reviewed and updated Current Outpatient Prescriptions  Medication Sig Dispense Refill  . glucose blood (ONE TOUCH ULTRA TEST) test strip Use as instructed 100 each 12  . metFORMIN (GLUCOPHAGE-XR) 500 MG 24 hr tablet TAKE ONE TABLET BY MOUTH ONCE DAILYWITH BREAKFAST 90 tablet 0  . acetaminophen (TYLENOL) 650 MG CR tablet Take 2 tablets (1,300 mg total) by mouth 2 (two) times daily. (Patient not taking: Reported on 05/11/2015) 60 tablet 3  . aspirin 81 MG EC tablet Take 81 mg by mouth daily.      . Blood Glucose Monitoring Suppl (ONE TOUCH ULTRA 2) W/DEVICE KIT 2 mg by Does not apply route daily. 1 each 0  . glimepiride (AMARYL) 2 MG tablet Take 1 tablet (2 mg total) by mouth daily before breakfast. 30 tablet 3  . Lancets (ONETOUCH ULTRASOFT) lancets Use as instructed 100 each 12  . lisinopril (PRINIVIL,ZESTRIL) 10 MG tablet Take 1 tablet (10 mg total) by mouth daily. 90 tablet 1  . lovastatin (MEVACOR) 40 MG tablet Take 1 tablet (40 mg total) by mouth at bedtime. (Patient not taking: Reported on 05/11/2015) 30 tablet 3  . meclizine (  ANTIVERT) 32 MG tablet Take 1 tablet (32 mg total) by mouth 3 (three) times daily as needed. 30 tablet 0  . nitroGLYCERIN (NITROSTAT) 0.4 MG SL tablet Place 1 tablet (0.4 mg total) under the tongue every 5 (five) minutes as needed for chest pain. 100 tablet 3  . ondansetron (ZOFRAN-ODT) 4 MG disintegrating tablet Take 1 tablet (4 mg total) by mouth every 8 (eight) hours as needed for nausea or vomiting. (Patient not taking: Reported on 05/11/2015) 20 tablet 0  . sertraline (ZOLOFT) 50 MG tablet Take 1 tablet (50 mg total) by mouth daily. (Patient not taking: Reported on 01/18/2015) 30 tablet 3     No current facility-administered medications for this visit.     Review of Systems See HPI     Objective:   Physical Exam BP 156/74 mmHg  Pulse 90  Temp(Src) 98.1 F (36.7 C) (Oral)  Ht 5' 7"  (1.702 m)  Wt 236 lb 14.4 oz (107.457 kg)  BMI 37.10 kg/m2 Gen: NAD, alert, cooperative with exam,  CV: RRR, good S1/S2, no murmur,  Resp: CTABL, no wheezes, non-labored Skin: no rashes, normal turgor  Neuro: no gross deficits.  Psych: alert and oriented Thumb/hand Exam:  Laterality: right Appearance: No obvious atrophy or defect Tenderness: No tenderness to palpation Range of Motion: Normal wrist extension and flexion, normal wrist ulnar and radial deviation, normal thumb opposition, normal finger abduction and abduction Maneuvers: Tinel's: Negative Phalen's: Negative Strength:  Wrist extension: 5/5 Wrist flexion: 5/5 Ulnar Deviation: 5/5 Radial Deviation: 5/5  Neurovascularly intact  Orthostatic VS for the past 24 hrs:  BP- Lying Pulse- Lying BP- Sitting Pulse- Sitting BP- Standing at 0 minutes Pulse- Standing at 0 minutes  05/11/15 1628 150/64 mmHg 88 166/75 mmHg 87 162/82 mmHg 88     Assessment & Plan:   Right hand paresthesia Symptoms most likely consistent with carpal tunnel syndrome No atrophy and no loss of sensation - Advised cock-up splint - If no improvement with cock-up splint then maybe consider injections  Dizziness Has component suggestive of vertigo and orthostatic hypotension Orthostatic vital signs are negative No neurological deficits on exam Normal gait - Trial of meclizine - Referral placed for physical therapy with neuro vestibular rehabilitation - Advised that she go just for 1 session of therapy  Grief reaction Her mood seems appropriate for the situation Advised that she can follow-up with me or have integrative care and flulike discuss this further

## 2015-05-12 ENCOUNTER — Telehealth: Payer: Self-pay | Admitting: Family Medicine

## 2015-05-12 DIAGNOSIS — D509 Iron deficiency anemia, unspecified: Secondary | ICD-10-CM

## 2015-05-12 DIAGNOSIS — R202 Paresthesia of skin: Secondary | ICD-10-CM | POA: Insufficient documentation

## 2015-05-12 DIAGNOSIS — F4321 Adjustment disorder with depressed mood: Secondary | ICD-10-CM | POA: Insufficient documentation

## 2015-05-12 LAB — HEPATITIS C ANTIBODY: HCV Ab: NEGATIVE

## 2015-05-12 NOTE — Telephone Encounter (Signed)
Spoke with patient about lab results. I have made referral to GI for screening for colonoscopy but failure to follow through. Still having persistent microcytic anemia. Advised that she needs to be scoped.   Myra RudeJeremy E Schmitz, MD PGY-3, [facility] 05/12/2015, 10:49 AM

## 2015-05-12 NOTE — Assessment & Plan Note (Signed)
Her mood seems appropriate for the situation Advised that she can follow-up with me or have integrative care and flulike discuss this further

## 2015-05-12 NOTE — Assessment & Plan Note (Signed)
Has component suggestive of vertigo and orthostatic hypotension Orthostatic vital signs are negative No neurological deficits on exam Normal gait - Trial of meclizine - Referral placed for physical therapy with neuro vestibular rehabilitation - Advised that she go just for 1 session of therapy

## 2015-05-12 NOTE — Assessment & Plan Note (Signed)
Symptoms most likely consistent with carpal tunnel syndrome No atrophy and no loss of sensation - Advised cock-up splint - If no improvement with cock-up splint then maybe consider injections

## 2015-07-22 ENCOUNTER — Telehealth: Payer: Self-pay | Admitting: Family Medicine

## 2015-07-22 DIAGNOSIS — I10 Essential (primary) hypertension: Secondary | ICD-10-CM

## 2015-07-22 DIAGNOSIS — R42 Dizziness and giddiness: Secondary | ICD-10-CM

## 2015-07-22 DIAGNOSIS — E785 Hyperlipidemia, unspecified: Secondary | ICD-10-CM

## 2015-07-22 DIAGNOSIS — E118 Type 2 diabetes mellitus with unspecified complications: Secondary | ICD-10-CM

## 2015-07-22 NOTE — Telephone Encounter (Signed)
Pt called because [facility] is no longer taking [employer] and [employer] for prescriptions. She needs all her medications called in to [facility] on [address] ASAP since she will be out today. jw

## 2015-07-26 MED ORDER — GLIMEPIRIDE 2 MG PO TABS: 2.0000 mg | ORAL_TABLET | Freq: Every day | ORAL | Status: DC

## 2015-07-26 MED ORDER — LISINOPRIL 10 MG PO TABS
10.0000 mg | ORAL_TABLET | Freq: Every day | ORAL | Status: DC
Start: 2015-07-26 — End: 2016-01-10

## 2015-07-26 MED ORDER — LOVASTATIN 40 MG PO TABS: 40.0000 mg | ORAL_TABLET | Freq: Every day | ORAL | Status: DC

## 2015-07-26 MED ORDER — NITROGLYCERIN 0.4 MG SL SUBL: 0.4000 mg | SUBLINGUAL_TABLET | SUBLINGUAL | Status: DC | PRN

## 2015-07-26 MED ORDER — METFORMIN HCL ER 500 MG PO TB24: ORAL_TABLET | ORAL | Status: DC

## 2015-07-26 NOTE — Telephone Encounter (Signed)
Medications (amaryl, nitrostat, lovastatin, metformin, lisinopril) sent into [facility].   Myra Rude, MD PGY-3, [facility] 07/26/2015, 12:36 PM

## 2015-07-26 NOTE — Telephone Encounter (Signed)
Pt called because she is checking the status of her request to have all her medications transferred to [facility] [address]. She called on Friday 07/22/15 to have this done and now she is out of medication. jw

## 2015-07-26 NOTE — Telephone Encounter (Signed)
Will forward to MD to refill medications that we are writing for patient. Jazmin Hartsell,CMA

## 2015-08-16 ENCOUNTER — Telehealth: Payer: Self-pay | Admitting: Family Medicine

## 2015-08-16 NOTE — Telephone Encounter (Signed)
[name] cancelled her appts for Endocopy and Colonoscopy with [facility].  Appts were sched in January.  They called to reschedule and she declined stating she didn't need either procedure.

## 2015-08-31 ENCOUNTER — Other Ambulatory Visit: Payer: Self-pay | Admitting: *Deleted

## 2015-08-31 DIAGNOSIS — E118 Type 2 diabetes mellitus with unspecified complications: Secondary | ICD-10-CM

## 2015-08-31 DIAGNOSIS — E785 Hyperlipidemia, unspecified: Secondary | ICD-10-CM

## 2015-08-31 NOTE — Telephone Encounter (Signed)
90 day supply. Desmin Daleo L, RN  

## 2015-09-01 MED ORDER — GLIMEPIRIDE 2 MG PO TABS: 2.0000 mg | ORAL_TABLET | Freq: Every day | ORAL | Status: DC

## 2015-09-01 MED ORDER — LOVASTATIN 40 MG PO TABS: 40.0000 mg | ORAL_TABLET | Freq: Every day | ORAL | Status: DC

## 2015-10-07 ENCOUNTER — Ambulatory Visit (INDEPENDENT_AMBULATORY_CARE_PROVIDER_SITE_OTHER): Payer: BLUE CROSS/BLUE SHIELD | Admitting: Family Medicine

## 2015-10-07 ENCOUNTER — Encounter: Payer: Self-pay | Admitting: Family Medicine

## 2015-10-07 VITALS — BP 139/70 | HR 98 | Temp 98.3°F | Ht 67.0 in | Wt 238.2 lb

## 2015-10-07 DIAGNOSIS — R1013 Epigastric pain: Secondary | ICD-10-CM

## 2015-10-07 DIAGNOSIS — R350 Frequency of micturition: Secondary | ICD-10-CM | POA: Diagnosis not present

## 2015-10-07 DIAGNOSIS — E118 Type 2 diabetes mellitus with unspecified complications: Secondary | ICD-10-CM

## 2015-10-07 LAB — POCT URINALYSIS DIPSTICK
Bilirubin, UA: NEGATIVE
Leukocytes, UA: NEGATIVE
Nitrite, UA: NEGATIVE
PROTEIN UA: NEGATIVE
RBC UA: NEGATIVE
UROBILINOGEN UA: 1
pH, UA: 5.5

## 2015-10-07 LAB — POCT GLYCOSYLATED HEMOGLOBIN (HGB A1C): Hemoglobin A1C: 6.5

## 2015-10-07 MED ORDER — OMEPRAZOLE 20 MG PO CPDR: 20.0000 mg | DELAYED_RELEASE_CAPSULE | Freq: Every day | ORAL | Status: DC

## 2015-10-07 NOTE — Patient Instructions (Addendum)
Thank you for coming in today, it was so nice to see you!  Today we talked about your stomach pain.  I will send over a prescription for Omeprazole to your pharmacy, this medication will lower the acid in your stomach. I highly recommend you see [facility] to get an upper endoscopy and colonoscopy. Please consider calling them to schedule these.  We will also check your urine today to make sure you don't have an infection.   Please go to the Emergency room if your abdominal pain becomes worse or your have blood in your vomit or stool.  If you have any questions or concerns, please do not hesitate to call the office at [phone].  Sincerely,  Anders Simmondshristina Satya Buttram, MD   Abdominal Pain, Adult Many things can cause abdominal pain. Usually, abdominal pain is not caused by a disease and will improve without treatment. It can often be observed and treated at home. Your health care provider will do a physical exam and possibly order blood tests and X-rays to help determine the seriousness of your pain. However, in many cases, more time must pass before a clear cause of the pain can be found. Before that point, your health care provider may not know if you need more testing or further treatment. HOME CARE INSTRUCTIONS Monitor your abdominal pain for any changes. The following actions may help to alleviate any discomfort you are experiencing:  Only take over-the-counter or prescription medicines as directed by your health care provider.  Do not take laxatives unless directed to do so by your health care provider.  Try a clear liquid diet (broth, tea, or water) as directed by your health care provider. Slowly move to a bland diet as tolerated. SEEK MEDICAL CARE IF:  You have unexplained abdominal pain.  You have abdominal pain associated with nausea or diarrhea.  You have pain when you urinate or have a bowel movement.  You experience abdominal pain that wakes you in the  night.  You have abdominal pain that is worsened or improved by eating food.  You have abdominal pain that is worsened with eating fatty foods.  You have a fever. SEEK IMMEDIATE MEDICAL CARE IF:  Your pain does not go away within 2 hours.  You keep throwing up (vomiting).  Your pain is felt only in portions of the abdomen, such as the right side or the left lower portion of the abdomen.  You pass bloody or black tarry stools. MAKE SURE YOU:  Understand these instructions.  Will watch your condition.  Will get help right away if you are not doing well or get worse.   This information is not intended to replace advice given to you by your health care provider. Make sure you discuss any questions you have with your health care provider.   Document Released: 03/07/2005 Document Revised: 02/16/2015 Document Reviewed: 02/04/2013 Elsevier Interactive Patient Education [employer]2016 Elsevier Inc.

## 2015-10-07 NOTE — Progress Notes (Signed)
   Subjective:    Patient ID: Brandi Davenport , female   DOB: Oct 16, 1955 , 60 y.o..   MRN: [id number]  HPI  Brandi Davenport is here for a same day appointment for "stomach issues".  ABDOMINAL PAIN  Location: "middle"  Onset: 1 week  ago  Radiation: left flank  Severity: 7/10 on pain scale Quality: "fluttering"  Duration: Will last for 20 minutes at a time, pain is intermittent and has not changed in intensity over the last week. Better with: No real alleviating factors identified. Has tried Tylenol but provided minimal relief.   Worse with: No aggravating factors identified  Symptoms Nausea/Vomiting: yes. Has been nauseas all week. Emesis x 1 on Monday (non-bloody, non-bilious) Diarrhea: no  Constipation: no  Melena/BRBPR: positive dark stools, no hematochezia Hematemesis: no  Anorexia: no but has had decreased appetite Fever/Chills: no  Dysuria: no  Increased urinary frequency: yes Rash: no  Wt loss: no  EtOH use: no  NSAIDs/ASA: no   Was scheduled for EGD and colonoscopy earlier this year with [facility] but cancelled. Patient revealed she was afraid of tests.   Review of Systems: Per HPI. All other systems reviewed and are negative.  Past Medical History: Patient Active Problem List   Diagnosis Date Noted  . Right hand paresthesia 05/12/2015  . Grief reaction 05/12/2015  . Headache 01/18/2015  . Generalized anxiety disorder 10/11/2014  . Incontinence 10/11/2014  . Dizziness 10/11/2014  . Mass 06/18/2014  . Major depressive disorder, single episode 01/10/2010  . GERD 02/25/2008  . HYPERLIPIDEMIA 03/13/2007  . DM type 2 (diabetes mellitus, type 2) ([facility]) 08/08/2006  . HYPERTENSION, BENIGN SYSTEMIC 08/08/2006    Medications: reviewed   Social Hx:  reports that she has never smoked. She does not have any smokeless tobacco history on file.    Objective:   BP 139/70 mmHg  Pulse 98  Temp(Src) 98.3 F (36.8 C) (Oral)  Ht 5\' 7"  (1.702 m)  Wt 238 lb 3.2 oz  (108.047 kg)  BMI 37.30 kg/m2 Physical Exam  Gen: NAD, alert, cooperative with exam, well-appearing Cardiac: Regular rate and rhythm, normal S1/S2, no murmur, no edema, capillary refill brisk  Respiratory: Clear to auscultation bilaterally, no wheezes, non-labored Gastrointestinal: soft, positive bowel sounds, tender to deep palpation over epigastric area, negative murphys sign, obese abdomen. Skin: no rashes, normal turgor  Psych: good insight, normal mood and affect   Assessment & Plan:  Abdominal pain, epigastric Associated with possible melena, tenderness to deep palpation of epigastric region, radiation to left flank, nausea, and vomiting x 1. Differentials include gastritis (GERD vs peptic ulcer disease), dyspepsia, or less likely nephrolithiasis (UA WNL). Discussed case with Dr. Lum BabeEniola. - Begin Omeprazole 20 mg daily  - No NSAIDS - Discussed importance of scheduling EGD and colonoscopy with [facility] for further evaluation. She previously cancelled these tests. Patient will contemplate calling to re-schedule.  - Red flag symptoms discussed and reasons to present to ED - Also collected A1C today as patient has not been seen for diabetes in about 6 months and will need to see PCP

## 2015-10-08 NOTE — Assessment & Plan Note (Addendum)
Associated with possible melena, tenderness to deep palpation of epigastric region, radiation to left flank, nausea, and vomiting x 1. Differentials include gastritis (GERD vs peptic ulcer disease), dyspepsia, or less likely nephrolithiasis (UA WNL). Discussed case with Dr. Lum BabeEniola. - Begin Omeprazole 20 mg daily  - No NSAIDS - Discussed importance of scheduling EGD and colonoscopy with [facility] for further evaluation. She previously cancelled these tests. Patient will contemplate calling to re-schedule.  - Red flag symptoms discussed and reasons to present to ED - Also collected A1C today as patient has not been seen for diabetes in about 6 months and will need to see PCP

## 2015-10-10 ENCOUNTER — Telehealth: Payer: Self-pay | Admitting: Family Medicine

## 2015-10-10 NOTE — Telephone Encounter (Signed)
Called patient to review normal UA and elevated A1C of 6.5. Advised patient to make an appointment with me to discuss diabetes. Patient agreeable and appreciate of the phone call.

## 2015-10-27 ENCOUNTER — Telehealth: Payer: Self-pay | Admitting: Family Medicine

## 2015-10-27 MED ORDER — CYCLOBENZAPRINE HCL 10 MG PO TABS: 10.0000 mg | ORAL_TABLET | Freq: Three times a day (TID) | ORAL | Status: DC | PRN

## 2015-10-27 NOTE — Telephone Encounter (Signed)
Patient calling to get something called in for pain she' been having to left side.

## 2015-10-27 NOTE — Telephone Encounter (Signed)
Spoke with patient about her problem. She is having pain in the left side of her abdomen. She was seen in 4/28 for abdomen pain and was started on PPI. It started before 4/28. She has been taking ibuprofen and tylenol with minimal relief. The PPI is helping with her reflux but not with this pain. She feels like it is more of a muscle. She has been doing strenuous activities as of late. She denies any changes in bowel movements.   She may have a muscle strain and will try flexeril. Also emphasized the importance of getting a colonoscopy.   Myra RudeJeremy E Schmitz, MD PGY-3, [facility] 10/27/2015, 12:22 PM

## 2015-11-04 ENCOUNTER — Ambulatory Visit (INDEPENDENT_AMBULATORY_CARE_PROVIDER_SITE_OTHER): Payer: BLUE CROSS/BLUE SHIELD | Admitting: Family Medicine

## 2015-11-04 ENCOUNTER — Telehealth: Payer: Self-pay | Admitting: Family Medicine

## 2015-11-04 ENCOUNTER — Encounter: Payer: Self-pay | Admitting: Family Medicine

## 2015-11-04 VITALS — BP 159/76 | HR 80 | Temp 98.5°F | Wt 241.0 lb

## 2015-11-04 DIAGNOSIS — I10 Essential (primary) hypertension: Secondary | ICD-10-CM

## 2015-11-04 DIAGNOSIS — R1031 Right lower quadrant pain: Secondary | ICD-10-CM | POA: Diagnosis not present

## 2015-11-04 DIAGNOSIS — R300 Dysuria: Secondary | ICD-10-CM | POA: Diagnosis not present

## 2015-11-04 DIAGNOSIS — R1013 Epigastric pain: Secondary | ICD-10-CM | POA: Diagnosis not present

## 2015-11-04 DIAGNOSIS — G8929 Other chronic pain: Secondary | ICD-10-CM

## 2015-11-04 DIAGNOSIS — K5900 Constipation, unspecified: Secondary | ICD-10-CM | POA: Diagnosis not present

## 2015-11-04 LAB — POCT URINALYSIS DIPSTICK
Bilirubin, UA: NEGATIVE
Blood, UA: NEGATIVE
GLUCOSE UA: NEGATIVE
Ketones, UA: NEGATIVE
LEUKOCYTES UA: NEGATIVE
Nitrite, UA: NEGATIVE
PROTEIN UA: NEGATIVE
Spec Grav, UA: 1.02
UROBILINOGEN UA: 0.2
pH, UA: 5.5

## 2015-11-04 MED ORDER — DOCUSATE SODIUM 100 MG PO CAPS: 100.0000 mg | ORAL_CAPSULE | Freq: Two times a day (BID) | ORAL | Status: DC | PRN

## 2015-11-04 MED ORDER — MELOXICAM 15 MG PO TABS: 15.0000 mg | ORAL_TABLET | Freq: Every day | ORAL | Status: DC | PRN

## 2015-11-04 NOTE — Assessment & Plan Note (Signed)
UA normal and negative for infection. Etiology unclear. ?? Interstitial cystitis. May use pain meds as prescribed prn. Monitor for now. F/U soon with PCP if no improvement in few days or worsening. She agreed with plan.

## 2015-11-04 NOTE — Assessment & Plan Note (Signed)
BP slightly elevated. ?? Related to pain. No dose adjustment made today. Continue monitoring for now. PCP f/u soon for reassessment.

## 2015-11-04 NOTE — Telephone Encounter (Signed)
I was unable to reach patient or leave a telephone message to call back. However her urine test was neg.

## 2015-11-04 NOTE — Assessment & Plan Note (Signed)
Colace recommended prn. She will pick this up at the pharmacy.

## 2015-11-04 NOTE — Patient Instructions (Signed)
It was nice seeing you today. I am sorry you still have some pain. It sounds like muscle pain. I have sent your prescription to the pharmacy. Also get your colonoscopy soon. I have sent a referral to the gastroenterologist. Call soon if no improvement and use colace as needed for constipation.

## 2015-11-04 NOTE — Assessment & Plan Note (Signed)
Sounds mostly MSK than intraabdominal pain. Pain is worsened by extremity movement in certain position. Mobic prescribed prn pain. Patient also counseled on obtaining colonoscopy. Although her symptoms sounds more like MSK, it will be nice to ensure the does not have IBD or other GI abnormality. Referral to GI done. F/U with PCP soon for further management.

## 2015-11-04 NOTE — Progress Notes (Signed)
Subjective:     Patient ID: Brandi Davenport, female   DOB: 01/16/1956, 60 y.o.   MRN: [id number]  Abdominal Pain This is a new problem. The current episode started more than 1 month ago. The onset quality is gradual. The problem occurs constantly. The most recent episode lasted 1 month. The problem has been unchanged (Pain usually aggravated by movement). The pain is located in the LLQ. The pain is at a severity of 3/10. The pain is mild. The quality of the pain is aching. The abdominal pain radiates to the left flank. Associated symptoms include constipation and dysuria. Pertinent negatives include no diarrhea, fever, hematuria, melena, nausea or vomiting. Associated symptoms comments: Urination causes suprapubic pain. The pain is aggravated by movement. Relieved by: rest. She has tried acetaminophen (flexeril. Makes her sleepy so she stopped using it.) for the symptoms. The treatment provided no relief. There is no history of colon cancer.  HTN: Patient compliant with her meds, denies any concern other than pain (abdomen).  Current Outpatient Prescriptions on File Prior to Visit  Medication Sig Dispense Refill  . aspirin 81 MG EC tablet Take 81 mg by mouth daily.      . cyclobenzaprine (FLEXERIL) 10 MG tablet Take 1 tablet (10 mg total) by mouth 3 (three) times daily as needed for muscle spasms. 20 tablet 0  . glimepiride (AMARYL) 2 MG tablet Take 1 tablet (2 mg total) by mouth daily before breakfast. 90 tablet 0  . lisinopril (PRINIVIL,ZESTRIL) 10 MG tablet Take 1 tablet (10 mg total) by mouth daily. 90 tablet 1  . lovastatin (MEVACOR) 40 MG tablet Take 1 tablet (40 mg total) by mouth at bedtime. 90 tablet 0  . meclizine (ANTIVERT) 32 MG tablet Take 1 tablet (32 mg total) by mouth 3 (three) times daily as needed. 30 tablet 0  . metFORMIN (GLUCOPHAGE-XR) 500 MG 24 hr tablet TAKE ONE TABLET BY MOUTH ONCE DAILYWITH BREAKFAST 90 tablet 1  . nitroGLYCERIN (NITROSTAT) 0.4 MG SL tablet Place 1 tablet  (0.4 mg total) under the tongue every 5 (five) minutes as needed for chest pain. 10 tablet 3  . omeprazole (PRILOSEC) 20 MG capsule Take 1 capsule (20 mg total) by mouth daily. 30 capsule 1  . ondansetron (ZOFRAN-ODT) 4 MG disintegrating tablet Take 1 tablet (4 mg total) by mouth every 8 (eight) hours as needed for nausea or vomiting. 20 tablet 0  . sertraline (ZOLOFT) 50 MG tablet Take 1 tablet (50 mg total) by mouth daily. 30 tablet 3  . acetaminophen (TYLENOL) 650 MG CR tablet Take 2 tablets (1,300 mg total) by mouth 2 (two) times daily. (Patient not taking: Reported on 05/11/2015) 60 tablet 3  . Blood Glucose Monitoring Suppl (ONE TOUCH ULTRA 2) W/DEVICE KIT 2 mg by Does not apply route daily. 1 each 0  . glucose blood (ONE TOUCH ULTRA TEST) test strip Use as instructed 100 each 12  . Lancets (ONETOUCH ULTRASOFT) lancets Use as instructed 100 each 12   No current facility-administered medications on file prior to visit.   History reviewed. No pertinent past medical history.    Review of Systems  Constitutional: Negative for fever.  Respiratory: Negative.   Cardiovascular: Negative.   Gastrointestinal: Positive for abdominal pain and constipation. Negative for nausea, vomiting, diarrhea and melena.  Genitourinary: Positive for dysuria. Negative for hematuria.  Musculoskeletal: Negative.   All other systems reviewed and are negative.  Filed Vitals:   11/04/15 0851  BP: 159/76  Pulse: 80  Temp:  98.5 F (36.9 C)  TempSrc: Oral  Weight: 241 lb (109.317 kg)  SpO2: 100%        Objective:   Physical Exam  Constitutional: She is oriented to person, place, and time. She appears well-developed. No distress.  Cardiovascular: Normal rate, regular rhythm and normal heart sounds.   No murmur heard. Pulmonary/Chest: Effort normal and breath sounds normal. No respiratory distress. She has no wheezes. She has no rales.  Abdominal: Soft. Normal appearance and bowel sounds are normal. She  exhibits no ascites and no mass. There is no hepatomegaly. There is tenderness in the right lower quadrant. There is no rigidity, no rebound, no guarding, no CVA tenderness, no tenderness at McBurney's point and negative Murphy's sign.    Musculoskeletal: Normal range of motion.  Neurological: She is alert and oriented to person, place, and time. No cranial nerve deficit.  Nursing note and vitals reviewed.      Assessment:     Abdominal pain Constipation HTN Dysuria      Plan:     Check problem list.

## 2015-12-01 ENCOUNTER — Other Ambulatory Visit: Payer: Self-pay | Admitting: Family Medicine

## 2015-12-01 NOTE — Telephone Encounter (Signed)
Unable to leave VM for patient. If she calls back please have her speak with a nurse/CMA and ask why she is continuing to need to take the mobic. If she is still having abdominal pain then she needs to be seen by GI.   If any questions then please take the best time and phone number to call and I will try to call her back.   Myra RudeJeremy E Schmitz, MD PGY-3, [facility] 12/01/2015, 8:32 AM

## 2016-01-10 ENCOUNTER — Other Ambulatory Visit: Payer: Self-pay | Admitting: *Deleted

## 2016-01-10 DIAGNOSIS — I10 Essential (primary) hypertension: Secondary | ICD-10-CM

## 2016-01-10 DIAGNOSIS — E118 Type 2 diabetes mellitus with unspecified complications: Secondary | ICD-10-CM

## 2016-01-10 NOTE — Telephone Encounter (Signed)
Patient needs refill on her scripts. Brandi Davenport,CMA

## 2016-01-11 MED ORDER — LISINOPRIL 10 MG PO TABS
10.0000 mg | ORAL_TABLET | Freq: Every day | ORAL | 1 refills | Status: DC
Start: 2016-01-11 — End: 2016-03-12

## 2016-01-11 MED ORDER — GLIMEPIRIDE 2 MG PO TABS: 2.0000 mg | ORAL_TABLET | Freq: Every day | ORAL | 0 refills | Status: DC

## 2016-01-23 ENCOUNTER — Ambulatory Visit (INDEPENDENT_AMBULATORY_CARE_PROVIDER_SITE_OTHER): Payer: BLUE CROSS/BLUE SHIELD | Admitting: *Deleted

## 2016-01-23 ENCOUNTER — Ambulatory Visit (INDEPENDENT_AMBULATORY_CARE_PROVIDER_SITE_OTHER): Payer: BLUE CROSS/BLUE SHIELD | Admitting: Family Medicine

## 2016-01-23 ENCOUNTER — Encounter: Payer: Self-pay | Admitting: Family Medicine

## 2016-01-23 VITALS — BP 145/70 | HR 77 | Temp 98.4°F | Ht 67.0 in | Wt 244.8 lb

## 2016-01-23 VITALS — BP 160/100 | HR 77 | Temp 98.4°F | Ht 67.0 in

## 2016-01-23 DIAGNOSIS — Z0130 Encounter for examination of blood pressure without abnormal findings: Secondary | ICD-10-CM

## 2016-01-23 DIAGNOSIS — Z136 Encounter for screening for cardiovascular disorders: Secondary | ICD-10-CM

## 2016-01-23 DIAGNOSIS — I10 Essential (primary) hypertension: Secondary | ICD-10-CM

## 2016-01-23 DIAGNOSIS — R519 Headache, unspecified: Secondary | ICD-10-CM

## 2016-01-23 DIAGNOSIS — R51 Headache: Secondary | ICD-10-CM | POA: Diagnosis not present

## 2016-01-23 MED ORDER — KETOROLAC TROMETHAMINE 60 MG/2ML IM SOLN: 60.0000 mg | Freq: Once | INTRAMUSCULAR | Status: DC

## 2016-01-23 MED ORDER — KETOROLAC TROMETHAMINE 60 MG/2ML IM SOLN
60.0000 mg | Freq: Once | INTRAMUSCULAR | Status: AC
  Administered 2016-01-23: 60 mg via INTRAMUSCULAR

## 2016-01-23 NOTE — Progress Notes (Signed)
   Brandi Davenport in nurse clinic for blood pressure check.  Brandi Davenport reported headache x 3 days and blurred vision.  Pt is taking blood pressure medication as prescribed.  Pt denies chest pain, SOB or numbness/tingling of arms/legs.  Advised Brandi Davenport she should be seen.  Brandi Davenport to see Dr. Sydnee Cabaliallo this morning.  Clovis PuMartin, Jaymari Cromie L, RN   Today's Vitals   01/23/16 0934  BP: (!) 160/100  Pulse: 77  Temp: 98.4 F (36.9 C)  TempSrc: Oral  SpO2: 99%  Height: 5\' 7"  (1.702 m)  PainSc: 10-Worst pain ever  PainLoc: Head

## 2016-01-23 NOTE — Progress Notes (Signed)
   Subjective:    Patient ID: Brandi Davenport, female    DOB: 01-20-1956, 60 y.o.   MRN: [id number]   CC: Intractable headaches  HPI: Patient presents for a same day appointment to discuss intractable headaches since Friday (01/20/16). Patient report that headaches has gradually worsens since Friday and have been similar to ones experienced in the past. Patient describes headaches has bilateral, sharp pain 10/10, with some light sensitivity with no relief after taking tylenol. Patient attributing headaches to blood pressure medication, recognizing that lisinopril tab picked up from pharmacy on Friday look smaller and and headaches started right after taking the pills. Patient denies, nausea, vomiting, dizziness, vision troubles, tinnitus, chest pain and SOB.   Smoking status reviewed   ROS: all other systems were reviewed and are negative other than in the HPI   Past medical history, surgical, family, and social history reviewed and updated in the EMR as appropriate.  Objective:  BP (!) 145/70   Pulse 77   Temp 98.4 F (36.9 C) (Oral)   Ht 5\' 7"  (1.702 m)   Wt 244 lb 12.8 oz (111 kg)   BMI 38.34 kg/m  Vitals and nursing note reviewed General: Uncomfortable but in no acute distress, eyes are closed but able to participate in exam and answer questions Cardiac: RRR, normal heart sounds, no murmurs. 2+ radial and PT pulses bilaterally Respiratory: CTAB, normal effort, No wheezes, rales or rhonchi Abdomen: soft, nontender, nondistended, no hepatic or splenomegaly, +BS Extremities: no edema or cyanosis. WWP. Skin: warm and dry, no rashes noted Neuro: alert and oriented x4, no focal deficits Psych: Normal affect and mood   Assessment & Plan:    Headache Patient with history of headaches inconsistent with migraines headaches. Etiology could be 2/2 to uncontrolled hypertension though less likely. Patient was given 60 mg IM toradol while in office with improvement within 20 min. Could  consider neurology referral with increase frequency and/or lack of adequate treatment outcome.  HYPERTENSION, BENIGN SYSTEMIC Patient blood pressure elevated today on admission (160/100) with considerable decrease on remeasurment (145/70) after toradol injection. Most likely increased BP secondary to pain. --Will continue to monitor --Continue Lisinopril 10 mg daily    Lovena NeighboursAbdoulaye Shelagh Rayman, MD Family Medicine Resident PGY-1

## 2016-01-23 NOTE — Progress Notes (Signed)
Spoke with Gala RomneyDoug at [facility] [address] per provider's request to check on possible manufacturer change between refills.  He states that there was no change on either her lisinopril or glimepiride that she picked up on Friday.

## 2016-01-23 NOTE — Assessment & Plan Note (Signed)
Patient with history of headaches inconsistent with migraines headaches. Etiology could be 2/2 to uncontrolled hypertension though less likely. Patient was given 60 mg IM toradol while in office with improvement within 20 min. Could consider neurology referral with increase frequency and/or lack of adequate treatment outcome.

## 2016-01-23 NOTE — Assessment & Plan Note (Addendum)
Patient blood pressure elevated today on admission (160/100) with considerable decrease on remeasurment (145/70) after toradol injection. Most likely increased BP secondary to pain. --Will continue to monitor --Continue Lisinopril 10 mg daily

## 2016-01-26 ENCOUNTER — Telehealth: Payer: Self-pay | Admitting: Internal Medicine

## 2016-01-26 ENCOUNTER — Telehealth: Payer: Self-pay | Admitting: Family Medicine

## 2016-01-26 NOTE — Telephone Encounter (Signed)
Will forward to last MD seen 

## 2016-01-26 NOTE — Telephone Encounter (Signed)
Pt came in the other day and received a shot for pain for headaches. Pt states the headache is coming back and would like something for the pain. [facility] uses [facility] on [address] Please Advise. Thanks! ep

## 2016-01-26 NOTE — Telephone Encounter (Signed)
Called patient to address questions about her headaches. Talk to a family member, patient was not available. Will try again.    By Lovena NeighboursAbdoulaye Georganna Maxson, MD

## 2016-03-12 ENCOUNTER — Ambulatory Visit (INDEPENDENT_AMBULATORY_CARE_PROVIDER_SITE_OTHER): Payer: BLUE CROSS/BLUE SHIELD | Admitting: Internal Medicine

## 2016-03-12 VITALS — BP 163/87 | HR 84 | Temp 98.2°F | Ht 67.0 in | Wt 242.4 lb

## 2016-03-12 DIAGNOSIS — I10 Essential (primary) hypertension: Secondary | ICD-10-CM

## 2016-03-12 DIAGNOSIS — Z23 Encounter for immunization: Secondary | ICD-10-CM

## 2016-03-12 DIAGNOSIS — K219 Gastro-esophageal reflux disease without esophagitis: Secondary | ICD-10-CM | POA: Diagnosis not present

## 2016-03-12 DIAGNOSIS — E118 Type 2 diabetes mellitus with unspecified complications: Secondary | ICD-10-CM | POA: Diagnosis not present

## 2016-03-12 LAB — BASIC METABOLIC PANEL WITH GFR
BUN: 12 mg/dL (ref 7–25)
CHLORIDE: 102 mmol/L (ref 98–110)
CO2: 28 mmol/L (ref 20–31)
Calcium: 9.4 mg/dL (ref 8.6–10.4)
Creat: 0.62 mg/dL (ref 0.50–1.05)
GFR, Est African American: >89 mL/min (ref >=60)
Glucose, Bld: 95 mg/dL (ref 65–99)
Potassium: 4.3 mmol/L (ref 3.5–5.3)
SODIUM: 141 mmol/L (ref 135–146)

## 2016-03-12 LAB — POCT GLYCOSYLATED HEMOGLOBIN (HGB A1C): Hemoglobin A1C: 6.4

## 2016-03-12 LAB — LIPID PANEL
CHOL/HDL RATIO: 2.6 ratio (ref <=5.0)
Cholesterol: 156 mg/dL (ref 125–200)
HDL: 59 mg/dL (ref >=46)
LDL CALC: 82 mg/dL (ref <130)
TRIGLYCERIDES: 73 mg/dL (ref <150)
VLDL: 15 mg/dL (ref <30)

## 2016-03-12 MED ORDER — LISINOPRIL 10 MG PO TABS: 20.0000 mg | ORAL_TABLET | Freq: Every day | ORAL | 3 refills | Status: DC

## 2016-03-12 MED ORDER — RANITIDINE HCL 150 MG PO CAPS: 150.0000 mg | ORAL_CAPSULE | Freq: Two times a day (BID) | ORAL | 0 refills | Status: DC

## 2016-03-12 NOTE — Assessment & Plan Note (Signed)
Symptoms and signs consistent with reflux. Previously used prilosec in the past, does not feel it works for reflux  - Will prescribe Ranitidine BID

## 2016-03-12 NOTE — Progress Notes (Signed)
   [facility] Noralee CharsAsiyah Rosene Pilling, MD Phone: [phone]  Reason For Visit:   GI Reflux  - 1 week - Reflux right after patient eats food, especially dinner  - Patient states starts in epigastric area, patient develops gas and burps. Has a burning sensation in her throat.   - Does not take Prilosec  - Has hx of reflux, similar episode  - Worse with spicy foods and ketchup  - Mal[place]an for indigestion- helped some  - No chest pain/pressure, radiation of pain to neck or arm, SOB, diaphoresis   CHRONIC HTN: Reports Current Meds -Lisinopril 10 mg  Reports good compliance, did not take meds today. Tolerating well, w/o complaints. Lifestyle - Walking 3 times daily on job, doing a lot of exercise, limiting salt intake  Denies CP, dyspnea, HA, dizziness / lightheadedness - Swelling in legs every once in a while   DIABETES Issues: None. Patient does not take metformin. Can not tolerate it   Disease Monitoring: Checks blood sugars  Blood Sugar ranges- CBG 104  Max 125 Min 88   Polyuria/phagia/dipsia-none  Visual problems- No issues eyes, going to make an appointment to see eye doctor   Medications:  Compliance- Glimepiride  Hypoglycemic symptoms-1 episode of hypoglycemia last month, cbg 60 when on metformin and glimepiride. Orange juice brought it back up.    Past Medical History Reviewed problem list.  Medications- reviewed and updated No additions to family history Social history- patient is a non smoker  Objective: BP (!) 163/87 (BP Location: Left Arm, Patient Position: Sitting, Cuff Size: Large)   Pulse 84   Temp 98.2 F (36.8 C) (Oral)   Ht 5\' 7"  (1.702 m)   Wt 242 lb 6.4 oz (110 kg)   SpO2 100%   BMI 37.97 kg/m  Gen: NAD, alert, cooperative with exam Cardio: regular rate and rhythm, S1S2 heard, no murmurs appreciated Pulm: clear to auscultation bilaterally, no wheezes, rhonchi or rales Skin: dry, intact, no rashes or lesions Neuro: Strength and  sensation grossly intact   Assessment/Plan: See problem based a/p  HYPERTENSION, BENIGN SYSTEMIC Uncontrolled Blood pressure  - Will increase lisinopril from 10 mg to 20 mg  - Follow up in the next 2-3 week for health maintenance   GERD (gastroesophageal reflux disease) Symptoms and signs consistent with reflux. Previously used prilosec in the past, does not feel it works for reflux  - Will prescribe Ranitidine BID   DM type 2 (diabetes mellitus, type 2) A1C 6.4, recent hx of hypoglycemia. - Will trial off of glimepiride - Check A1C in 3 months - Discussed if patient has elevated CBGs in the 200s, please call to restart medication

## 2016-03-12 NOTE — Assessment & Plan Note (Signed)
Uncontrolled Blood pressure  - Will increase lisinopril from 10 mg to 20 mg  - Follow up in the next 2-3 week for health maintenance

## 2016-03-12 NOTE — Assessment & Plan Note (Signed)
A1C 6.4, recent hx of hypoglycemia. - Will trial off of glimepiride - Check A1C in 3 months - Discussed if patient has elevated CBGs in the 200s, please call to restart medication

## 2016-03-12 NOTE — Patient Instructions (Signed)
Diabetes Your diabetes is 6.4 controlled Your goal is to have an A1c < 7.0 Medicine Changes: I want you stop glimepiride for a trial to see how well your body tolerates no blood sugar medications  Homework: Continue to check blood sugars  Call us if: you have any morning blood sugars above 200   Come back to see us in: 2-3 weeks for a health maintenance/physical exam   High Blood Pressure Your goal is to have a BP average < 140/80 Medicine Changes: Increase lisinopril from 10 mg to 20 mg (please take 2 pills)   For your reflux,  Please take ranitidine twice daily

## 2016-03-16 ENCOUNTER — Other Ambulatory Visit: Payer: Self-pay | Admitting: Family Medicine

## 2016-03-16 DIAGNOSIS — Z1231 Encounter for screening mammogram for malignant neoplasm of breast: Secondary | ICD-10-CM

## 2016-03-27 ENCOUNTER — Ambulatory Visit: Payer: BLUE CROSS/BLUE SHIELD

## 2016-04-02 ENCOUNTER — Other Ambulatory Visit: Payer: Self-pay | Admitting: *Deleted

## 2016-04-02 ENCOUNTER — Encounter: Payer: BLUE CROSS/BLUE SHIELD | Admitting: Internal Medicine

## 2016-04-02 NOTE — Telephone Encounter (Signed)
Refill request for 90 day supply.  Martin, Tamika L, RN  

## 2016-04-03 MED ORDER — RANITIDINE HCL 150 MG PO CAPS: 150.0000 mg | ORAL_CAPSULE | Freq: Two times a day (BID) | ORAL | 0 refills | Status: DC

## 2016-04-06 ENCOUNTER — Ambulatory Visit
Admission: RE | Admit: 2016-04-06 | Discharge: 2016-04-06 | Disposition: A | Discharge status: Home / Self Care | Payer: BLUE CROSS/BLUE SHIELD | Source: Ambulatory Visit | Attending: Family Medicine | Admitting: Family Medicine

## 2016-04-06 DIAGNOSIS — Z1231 Encounter for screening mammogram for malignant neoplasm of breast: Secondary | ICD-10-CM

## 2016-04-24 ENCOUNTER — Encounter: Payer: Self-pay | Admitting: Internal Medicine

## 2016-04-24 ENCOUNTER — Ambulatory Visit (INDEPENDENT_AMBULATORY_CARE_PROVIDER_SITE_OTHER): Payer: BLUE CROSS/BLUE SHIELD | Admitting: Internal Medicine

## 2016-04-24 VITALS — BP 156/90 | HR 96 | Temp 98.2°F | Wt 246.0 lb

## 2016-04-24 DIAGNOSIS — H60393 Other infective otitis externa, bilateral: Secondary | ICD-10-CM | POA: Diagnosis not present

## 2016-04-24 DIAGNOSIS — H9203 Otalgia, bilateral: Secondary | ICD-10-CM

## 2016-04-24 DIAGNOSIS — I10 Essential (primary) hypertension: Secondary | ICD-10-CM

## 2016-04-24 MED ORDER — HYDROCORTISONE-ACETIC ACID 1-2 % OT SOLN: 3.0000 [drp] | Freq: Two times a day (BID) | OTIC | 0 refills | Status: DC

## 2016-04-24 MED ORDER — HYDROCHLOROTHIAZIDE 12.5 MG PO CAPS: 12.5000 mg | ORAL_CAPSULE | Freq: Every day | ORAL | 1 refills | Status: DC

## 2016-04-24 NOTE — Patient Instructions (Signed)
Ms. Orland Jarredroxler,  I recommend continuing lisinopril at increased dose of 20 mg and starting hydrochlorothiazide 12.5 mg. Please make an appointment in about 2 weeks to check on your blood pressure control.  I have prescribed ear drops that contain a cleaning solution and steroid that will hopefully provide you with relief.  Best, Dr. Sampson GoonFitzgerald

## 2016-04-24 NOTE — Progress Notes (Signed)
[facility] Progress Note  Subjective:  Brandi Davenport is a 60 y.o. female who presents for SDA for ear pain. Pain is bilateral and has been occurring for about 2 weeks. Pain described as more of an irritation and itching. Worse when trying to sleep. Has been trying to use a washcloth in ears to keep clean. Also will scratch ear canals with bobby pin. Denies trouble hearing or tinnitus. Denies fevers or sick contacts, though grandson has had a cough.   She also complains of headache that responds to tylenol/ibuprofen. BP medication was recently increased to lisinopril 20 mg daily from 10 mg daily. Denies vision changes, chest pain.  Social: Never smoker  Objective: Blood pressure (!) 156/90, pulse 96, temperature 98.2 F (36.8 C), temperature source Oral, weight 246 lb (111.6 kg). Body mass index is 38.53 kg/m. Constitutional: Obese female, in NAD HENT: Flaking of skin and mild erythema of bilateral ear canals. TMs normal bilaterally.  Cardiovascular: RRR, S1, S2, no m/r/g.  Pulmonary/Chest: Effort normal and breath sounds normal. No respiratory distress.  Musculoskeletal: Trace LE edema.  Psychiatric: Normal mood and affect.  Vitals reviewed  Assessment/Plan: Ear pain - Mild otitis externa. Recommended trying acetic acid- hydrocortisone ear drops and avoiding sticking things in her ears  HYPERTENSION, BENIGN SYSTEMIC - Still not at goal of <140/90. Will try adding hctz 12.5 mg in addition to 20 mg lisinopril daily.   Follow-up in 2 weeks for BP recheck.  Brandi GobbleHillary Larwence Tu, MD [facility], PGY-2

## 2016-04-26 DIAGNOSIS — H9209 Otalgia, unspecified ear: Secondary | ICD-10-CM | POA: Insufficient documentation

## 2016-04-26 NOTE — Assessment & Plan Note (Signed)
-   Mild otitis externa. Recommended trying acetic acid- hydrocortisone ear drops and avoiding sticking things in her ears

## 2016-04-26 NOTE — Assessment & Plan Note (Signed)
-   Still not at goal of <140/90. Will try adding hctz 12.5 mg in addition to 20 mg lisinopril daily.

## 2016-05-06 ENCOUNTER — Ambulatory Visit (INDEPENDENT_AMBULATORY_CARE_PROVIDER_SITE_OTHER): Payer: Worker's Compensation

## 2016-05-06 ENCOUNTER — Ambulatory Visit (HOSPITAL_COMMUNITY)
Admission: EM | Admit: 2016-05-06 | Discharge: 2016-05-06 | Disposition: A | Discharge status: Home / Self Care | Payer: Worker's Compensation | Attending: Family Medicine | Admitting: Family Medicine

## 2016-05-06 ENCOUNTER — Encounter (HOSPITAL_COMMUNITY): Payer: Self-pay

## 2016-05-06 DIAGNOSIS — S8001XA Contusion of right knee, initial encounter: Secondary | ICD-10-CM

## 2016-05-06 DIAGNOSIS — S6000XA Contusion of unspecified finger without damage to nail, initial encounter: Secondary | ICD-10-CM

## 2016-05-06 DIAGNOSIS — M25559 Pain in unspecified hip: Secondary | ICD-10-CM

## 2016-05-06 DIAGNOSIS — S7002XA Contusion of left hip, initial encounter: Secondary | ICD-10-CM

## 2016-05-06 MED ORDER — MELOXICAM 7.5 MG PO TABS: 7.5000 mg | ORAL_TABLET | Freq: Every day | ORAL | 0 refills | Status: DC

## 2016-05-06 NOTE — Discharge Instructions (Signed)
Please follow up with your primary care physican in 2 days.

## 2016-05-06 NOTE — ED Triage Notes (Signed)
Pt said she fell this morning at work and tripped over a rug and hurt her right knee and right hand. But also having pain in her left hip as well. Did not take any otc medication.

## 2016-05-06 NOTE — ED Provider Notes (Addendum)
[facility]    CSN: [id number] Arrival date & time: 05/06/16  1203     History   Chief Complaint Chief Complaint  Patient presents with  . Fall    HPI Brandi Davenport is a 60 y.o. female.   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 She was sent home after the accident.      History reviewed. No pertinent past medical history.  Patient Active Problem List   Diagnosis Date Noted  . Ear pain 04/26/2016  . Constipation 11/04/2015  . Dysuria 11/04/2015  . Right hand paresthesia 05/12/2015  . Grief reaction 05/12/2015  . Headache 01/18/2015  . Generalized anxiety disorder 10/11/2014  . Incontinence 10/11/2014  . Dizziness 10/11/2014  . Mass 06/18/2014  . Major depressive disorder, single episode 01/10/2010  . GERD (gastroesophageal reflux disease) 02/25/2008  . HYPERLIPIDEMIA 03/13/2007  . DM type 2 (diabetes mellitus, type 2) ([place]) 08/08/2006  . HYPERTENSION, BENIGN SYSTEMIC 08/08/2006    History reviewed. No pertinent surgical history.  OB History    No data available       Home Medications    Prior to Admission medications   Medication Sig Start Date End Date Taking? Authorizing Provider  acetaminophen (TYLENOL) 650 MG CR tablet Take 2 tablets (1,300 mg total) by mouth 2 (two) times daily. 08/18/10  Yes Silverio Decamp, MD  acetic acid-hydrocortisone (VOSOL-HC) otic solution Place 3 drops into both ears 2 (two) times daily. 04/24/16  Yes Hillary Corinda Gubler, MD  aspirin 81 MG EC tablet Take 81 mg by mouth daily.     Yes Historical Provider, MD  Blood Glucose Monitoring Suppl (ONE TOUCH ULTRA 2) W/DEVICE KIT 2 mg by Does not apply route daily. 01/26/15  Yes Rosemarie Ax, MD  cyclobenzaprine (FLEXERIL) 10 MG tablet Take 1 tablet (10 mg total) by mouth 3 (three) times daily as needed for muscle spasms. 10/27/15  Yes Rosemarie Ax, MD  docusate sodium (COLACE) 100 MG capsule Take 1 capsule (100 mg total) by mouth 2 (two) times daily as needed for mild constipation or moderate constipation. 11/04/15  Yes Kinnie Feil, MD  glucose blood (ONE TOUCH ULTRA TEST) test strip Use as instructed 01/26/15  Yes Rosemarie Ax, MD  hydrochlorothiazide (MICROZIDE) 12.5 MG capsule Take 1 capsule (12.5 mg total) by mouth daily. 04/24/16  Yes Hillary Corinda Gubler, MD  Lancets [facility] ULTRASOFT) lancets Use as instructed 01/26/15  Yes Rosemarie Ax, MD  lisinopril (PRINIVIL,ZESTRIL) 10 MG tablet Take 2 tablets (20 mg total) by mouth daily. 03/12/16  Yes Asiyah Cletis Media, MD  lovastatin (MEVACOR) 40 MG tablet Take 1 tablet (40 mg total) by mouth at bedtime. 09/01/15  Yes Rosemarie Ax, MD  meclizine (ANTIVERT) 32 MG tablet Take 1 tablet (32 mg total) by mouth 3 (three) times daily as needed. 05/11/15  Yes Rosemarie Ax, MD  ranitidine (ZANTAC) 150 MG capsule Take 1 capsule (150 mg total) by mouth 2 (two) times daily. 04/03/16  Yes Asiyah Cletis Media, MD  sertraline (ZOLOFT) 50 MG tablet Take 1 tablet (50 mg total) by mouth daily. 10/11/14  Yes Rosemarie Ax, MD  meloxicam (MOBIC) 7.5 MG tablet Take 1 tablet (7.5 mg total) by mouth daily. 05/06/16   Robyn Haber, MD  nitroGLYCERIN (NITROSTAT) 0.4 MG SL tablet Place 1 tablet (0.4 mg total) under the tongue every  5 (five) minutes as needed for chest pain. 07/26/15 07/25/16  Rosemarie Ax, MD    Family History No family history on file.  Social History Social History  Substance Use Topics  . Smoking status: Never Smoker  . Smokeless tobacco: Never Used  . Alcohol use Not on file     Allergies   Codeine phosphate and Tramadol   Review of Systems Review of Systems  Constitutional: Negative.   HENT: Negative.   Respiratory: Negative.   Cardiovascular: Negative.   Musculoskeletal: Positive for arthralgias, back pain and myalgias.  Neurological:  Negative.      Physical Exam Triage Vital Signs ED Triage Vitals  Enc Vitals Group     BP 05/06/16 1239 133/82     Pulse Rate 05/06/16 1239 85     Resp 05/06/16 1239 16     Temp 05/06/16 1239 98.2 F (36.8 C)     Temp Source 05/06/16 1239 Oral     SpO2 05/06/16 1239 100 %     Weight --      Height --      Head Circumference --      Peak Flow --      Pain Score 05/06/16 1241 8     Pain Loc --      Pain Edu? --      Excl. in [place]? --    No data found.   Updated Vital Signs BP 133/82 (BP Location: Left Arm)   Pulse 85   Temp 98.2 F (36.8 C) (Oral)   Resp 16   SpO2 100%    Physical Exam  Constitutional: She is oriented to person, place, and time. She appears well-developed and well-nourished.  HENT:  Head: Normocephalic.  Right Ear: External ear normal.  Left Ear: External ear normal.  Mouth/Throat: Oropharynx is clear and moist.  Eyes: Conjunctivae and EOM are normal.  Neck: Normal range of motion. Neck supple.  Pulmonary/Chest: Effort normal.  Abdominal: Soft.  Musculoskeletal: She exhibits tenderness. She exhibits no edema or deformity.  Patient can only bend her right knee about 90 before she has pain. There is a small abrasion located just below the lower pole of the patella. There is no effusion. There is no joint laxity.  Patient also has 2 small abrasions over the middle MCP. She is full range of motion of her fingers, no ecchymosis, and no significant swelling.  Patient has some tenderness over the posterior superior iliac crest on the left.  Neurological: She is alert and oriented to person, place, and time. No cranial nerve deficit or sensory deficit. She exhibits normal muscle tone. Coordination normal.  Skin: Skin is warm and dry.  Nursing note and vitals reviewed.    UC Treatments / Results  Labs (all labs ordered are listed, but only abnormal results are displayed) Labs Reviewed - No data to display  EKG  EKG Interpretation None        Radiology Dg Hand Complete Right  Result Date: 05/06/2016 CLINICAL DATA:  Recent fall with right hand pain, initial encounter EXAM: RIGHT HAND - COMPLETE 3+ VIEW COMPARISON:  None. FINDINGS: There is no evidence of fracture or dislocation. There is no evidence of arthropathy or other focal bone abnormality. Soft tissues are unremarkable. IMPRESSION: No acute abnormality noted. Electronically Signed   By: Inez Catalina M.D.   On: 05/06/2016 13:10    Procedures Procedures (including critical care time)  Medications Ordered in UC Medications - No data to display  Initial Impression / Assessment and Plan / UC Course  I have reviewed the triage vital signs and the nursing notes.  Pertinent labs & imaging results that were available during my care of the patient were reviewed by me and considered in my medical decision making (see chart for details).  Clinical Course     Final Clinical Impressions(s) / UC Diagnoses   Final diagnoses:  Contusion of finger of right hand, unspecified finger, initial encounter  Contusion of left hip, initial encounter  Contusion of right knee, initial encounter    New Prescriptions New Prescriptions   MELOXICAM (MOBIC) 7.5 MG TABLET    Take 1 tablet (7.5 mg total) by mouth daily.     Robyn Haber, MD 05/06/16 1348    Robyn Haber, MD 05/06/16 [phone]

## 2016-05-15 ENCOUNTER — Encounter: Payer: Self-pay | Admitting: Internal Medicine

## 2016-05-15 ENCOUNTER — Ambulatory Visit (INDEPENDENT_AMBULATORY_CARE_PROVIDER_SITE_OTHER): Payer: BLUE CROSS/BLUE SHIELD | Admitting: Internal Medicine

## 2016-05-15 VITALS — BP 148/88 | HR 74 | Temp 97.9°F | Ht 67.0 in | Wt 248.0 lb

## 2016-05-15 DIAGNOSIS — I10 Essential (primary) hypertension: Secondary | ICD-10-CM

## 2016-05-15 DIAGNOSIS — Z23 Encounter for immunization: Secondary | ICD-10-CM | POA: Diagnosis not present

## 2016-05-15 DIAGNOSIS — Z114 Encounter for screening for human immunodeficiency virus [HIV]: Secondary | ICD-10-CM | POA: Diagnosis not present

## 2016-05-15 DIAGNOSIS — E118 Type 2 diabetes mellitus with unspecified complications: Secondary | ICD-10-CM

## 2016-05-15 DIAGNOSIS — Z0000 Encounter for general adult medical examination without abnormal findings: Secondary | ICD-10-CM

## 2016-05-15 LAB — POCT GLYCOSYLATED HEMOGLOBIN (HGB A1C): HEMOGLOBIN A1C: 6.8

## 2016-05-15 MED ORDER — LISINOPRIL 40 MG PO TABS: 40.0000 mg | ORAL_TABLET | Freq: Every day | ORAL | 3 refills | Status: DC

## 2016-05-15 NOTE — Progress Notes (Signed)
Brandi Davenport is a 60 y.o. female presents to office today for annual physical exam examination.  Concerns today include:   1.  CHRONIC HTN: Reports none issues  Current Meds - HTCZ  Reports good compliance, took meds today. Tolerating well, w/o complaints. Lifestyle - walks all around at work, continues to improve diet  Denies CP, dyspnea, HA, edema, dizziness / lightheadedness  CHRONIC DM, Type 2: Reports  no concerns CBGs: Avg 120s, Low 95, High 134. Checks CBGs - only does a fasting  Reports good compliance. Tolerating well w/o side-effects Currently on ACEi / ARB  Any hypoglycemia episodes: Positive for feeling jittery, CBG 65. Denies palpations, diaphoresis, fatigue, weakness, jittery Denies polyuria, visual changes, numbness or tingling.  Last eye exam: Not this year  Last dental exam: None  Last colonoscopy: None  Last mammogram: Done 04/06/2016 Last pap smear: Deferring due hysterectomy  Immunizations needed: TDAP today  Refills needed today: Refills   Women's Health  Periods: None since 9989 Sexual activity:  Married  STD Screening: No concerns  Exercise: Walk daily Smoking: None  Alcohol: None Drugs: None Advance directives: given information  Mood: No depression or anxiety   No past medical history on file. Social History   Social History  . Marital status: Legally Separated    Spouse name: N/A  . Number of children: N/A  . Years of education: N/A   Occupational History  . Not on file.   Social History Main Topics  . Smoking status: Never Smoker  . Smokeless tobacco: Never Used  . Alcohol use Not on file  . Drug use: No  . Sexual activity: Not on file   Other Topics Concern  . Not on file   Social History Narrative  . No narrative on file   No past surgical history on file. No family history on file.  ROS: Review of Systems Review of Systems  Constitutional: Negative for chills and fever.  HENT: Negative for congestion and ear  discharge.   Eyes: Negative for blurred vision and double vision.  Respiratory: Negative for cough and sputum production.   Cardiovascular: Negative for chest pain and palpitations.  Gastrointestinal: Negative for heartburn, nausea and vomiting.  Genitourinary: Negative for dysuria and frequency.  Musculoskeletal: Negative for neck pain.  Skin: Negative for rash.  Neurological: Negative for dizziness and headaches.  Psychiatric/Behavioral: Negative for depression.     Vitals:   05/15/16 1549  BP: (!) 148/88  Pulse: 74  Temp: 97.9 F (36.6 C)    Physical exam Physical Exam  Constitutional: She is oriented to person, place, and time. She appears well-developed and well-nourished.  HENT:  Head: Normocephalic.  Eyes: Conjunctivae and EOM are normal. Pupils are equal, round, and reactive to light.  Neck: Normal range of motion. No thyromegaly present.  Cardiovascular: Normal rate, regular rhythm, normal heart sounds and intact distal pulses.   Pulmonary/Chest: Effort normal and breath sounds normal.  Abdominal: Soft. Bowel sounds are normal.  Musculoskeletal: Normal range of motion.  Neurological: She is alert and oriented to person, place, and time.  Skin: Skin is warm and dry.  Psychiatric: She has a normal mood and affect.   Diabetic Foot Exam - Simple   Simple Foot Form Diabetic Foot exam was performed with the following findings:  Yes 05/15/2016  3:25 PM  Visual Inspection No deformities, no ulcerations, no other skin breakdown bilaterally:  Yes Sensation Testing Intact to touch and monofilament testing bilaterally:  Yes Pulse Check Posterior Tibialis  and Dorsalis pulse intact bilaterally:  Yes Comments     Assessment/ Plan: Patient with T2DM and HTN here for annual physical exam.   HYPERTENSION, BENIGN SYSTEMIC Not controlled  - Medications include: Lisinopril 20 mg--> will increase to 40 mg - Last BMET within normal limits  - Follow up in 2 months      DM  type 2 (diabetes mellitus, type 2) A1C 6.8 today, hypoglycemic to 65 in the last month, will watch off medication for a month  - Medication management - Glimepiride 2 mg daily - will stop due to hypoglycemia  - Watch for a month without medication, with exercise and diet  - Consider starting Januvia at next visit if CBGs not well controlled  - Please follow up in 1 months  - Currently on statin, ACE, aspirin - Diabetic foot exam performed  - Patient to follow up with opthalmology    Healthcare maintenance Provided information for colonoscopy No need for pap smear due hysterectomy    Jabron Weese Cathlean CowerMikell PGY-2, [facility]

## 2016-05-15 NOTE — Patient Instructions (Addendum)
Please follow up in 1 week if no improvement of gastric pain associated with eating. DO NOT TAKE Ibuprofen or MOBIC. Please stop Glimperide as discussed previous. Please follow up in 1 month for diabetes/HTN. I am going to increase lisnopril to 40 mg, please pick up from pharmacy

## 2016-05-16 LAB — HIV ANTIBODY (ROUTINE TESTING W REFLEX): HIV: NONREACTIVE

## 2016-05-21 NOTE — Assessment & Plan Note (Signed)
Provided information for colonoscopy No need for pap smear due hysterectomy

## 2016-05-21 NOTE — Assessment & Plan Note (Signed)
Not controlled  - Medications include: Lisinopril 20 mg--> will increase to 40 mg - Last BMET within normal limits  - Follow up in 2 months

## 2016-05-21 NOTE — Assessment & Plan Note (Signed)
A1C 6.8 today, hypoglycemic to 65 in the last month, will watch off medication for a month  - Medication management - Glimepiride 2 mg daily - will stop due to hypoglycemia  - Watch for a month without medication, with exercise and diet  - Consider starting Januvia at next visit if CBGs not well controlled  - Please follow up in 1 months  - Currently on statin, ACE, aspirin - Diabetic foot exam performed  - Patient to follow up with opthalmology

## 2016-05-24 ENCOUNTER — Ambulatory Visit: Payer: Self-pay

## 2016-05-31 ENCOUNTER — Ambulatory Visit: Payer: Self-pay | Admitting: Internal Medicine

## 2016-06-07 ENCOUNTER — Telehealth: Payer: Self-pay

## 2016-06-07 MED ORDER — SITAGLIPTIN PHOSPHATE 50 MG PO TABS: 50.0000 mg | ORAL_TABLET | Freq: Every day | ORAL | 0 refills | Status: DC

## 2016-06-07 NOTE — Telephone Encounter (Signed)
Called patient and told her we would start her on Januvia.

## 2016-06-07 NOTE — Telephone Encounter (Signed)
Pt called saying she was dizzy yesterday, took her sugar and it was 220. Would like to get back on glimepiride. Would like Rx called in today. Please call when it has been called in so she can go pick it up. [phone]. Sunday SpillersSharon T Saunders, CMA

## 2016-06-13 ENCOUNTER — Telehealth: Payer: Self-pay | Admitting: *Deleted

## 2016-06-13 NOTE — Telephone Encounter (Signed)
PA was cancelled, patient should be able to pick up medication from pharmacy.  Clovis PuMartin, Tamika L, RN

## 2016-06-13 NOTE — Telephone Encounter (Signed)
Prior Authorization received from [facility] for Januvia 50 mg. PA completed online at www.[email or web address]. PA is pending, review process could take up to 5 business days.   Clovis PuMartin, Tamika L, RN

## 2016-06-20 ENCOUNTER — Other Ambulatory Visit: Payer: Self-pay | Admitting: Internal Medicine

## 2016-06-20 DIAGNOSIS — I10 Essential (primary) hypertension: Secondary | ICD-10-CM

## 2016-06-20 MED ORDER — LOVASTATIN 40 MG PO TABS: 40.0000 mg | ORAL_TABLET | Freq: Every day | ORAL | 0 refills | Status: DC

## 2016-06-21 ENCOUNTER — Other Ambulatory Visit: Payer: Self-pay | Admitting: Family Medicine

## 2016-06-29 ENCOUNTER — Ambulatory Visit (INDEPENDENT_AMBULATORY_CARE_PROVIDER_SITE_OTHER): Payer: BLUE CROSS/BLUE SHIELD | Admitting: Internal Medicine

## 2016-06-29 ENCOUNTER — Encounter: Payer: Self-pay | Admitting: Internal Medicine

## 2016-06-29 DIAGNOSIS — R059 Cough, unspecified: Secondary | ICD-10-CM

## 2016-06-29 DIAGNOSIS — R05 Cough: Secondary | ICD-10-CM

## 2016-06-29 MED ORDER — ONDANSETRON HCL 4 MG PO TABS: 4.0000 mg | ORAL_TABLET | Freq: Three times a day (TID) | ORAL | 0 refills | Status: DC | PRN

## 2016-06-29 MED ORDER — GUAIFENESIN-CODEINE 100-10 MG/5ML PO SOLN: 5.0000 mL | ORAL | 0 refills | Status: DC | PRN

## 2016-06-29 MED ORDER — BENZONATATE 200 MG PO CAPS: 200.0000 mg | ORAL_CAPSULE | Freq: Three times a day (TID) | ORAL | 0 refills | Status: DC | PRN

## 2016-06-29 NOTE — Assessment & Plan Note (Signed)
Suspect viral URI as cause of cough worsened by postnasal drip. Doubt PNA as lung sounds clear and equal, patient afebrile, and no hypoxia. Pain with coughing consistent with msk irritation/inflammation surrounding the ribs. Will prescribe guaifenesin-codeine for cough. Patient has history of nausea with this medication so will also prescribe Zofran. Rx for Tessalon given that patient can take while working. Recommended NSAIDs for msk pain. Recommended Mucinex and anti-histamine for nasal drainage. Patient to return if cough worsens significantly or does not improve over the next 1-2 weeks. Patient also to return for fevers, SOB, and chest pain.

## 2016-06-29 NOTE — Progress Notes (Signed)
   Subjective:    Candace GallusSherline [name] - 61 y.o. female MRN [id number]  Date of birth: 03-18-1956  HPI  Alyda [name] is here for SDA for cough.  Cough: Has been present for almost two weeks and is not improving much. Cough is mostly non-productive. Over the past several days her lower lateral ribs have been hurting each time she coughs. She reports significant nasal drainage and some mild irritation to her throat. She denies SOB, DOE and wheezing. Denies fevers at any time during this illness. Denies chills. Denies LE edema. Denies chest pain. Denies pain radiating to arm, neck or jaw. 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 Patient does not smoke and does not have any known underlying lung disease. Patient has been taking Robitussin and Tylenol without relief.   -  reports that she has never smoked. She has never used smokeless tobacco. - Review of Systems: Per HPI. - Past Medical History: Patient Active Problem List   Diagnosis Date Noted  . Ear pain 04/26/2016  . Constipation 11/04/2015  . Right hand paresthesia 05/12/2015  . Grief reaction 05/12/2015  . Generalized anxiety disorder 10/11/2014  . Incontinence 10/11/2014  . Dizziness 10/11/2014  . Mass 06/18/2014  . Cough 06/18/2011  . Major depressive disorder, single episode 01/10/2010  . GERD (gastroesophageal reflux disease) 02/25/2008  . HYPERLIPIDEMIA 03/13/2007  . DM type 2 (diabetes mellitus, type 2) (HCC) 08/08/2006  . HYPERTENSION, BENIGN SYSTEMIC 08/08/2006   - Medications: reviewed and updated   Objective:   Physical Exam BP 122/70   Pulse 78   Temp 98.2 F (36.8 C) (Oral)   Ht 5\' 7"  (1.702 m)   Wt 241 lb 12.8 oz (109.7 kg)   LMP  (Exact Date)   SpO2 99%   BMI 37.87 kg/m  Gen: NAD, alert, cooperative with exam, appears mildly ill but non-toxic  HEENT: NCAT, PERRL, clear conjunctiva, oropharynx erythematous without exudates, no TTP over frontal or maxillart sinuses, TMs normal  bilaterally  CV: RRR, good S1/S2, no murmur, no edema, capillary refill brisk, mild TTP over anteriolateral lower ribs bilaterally  Resp: CTABL, no wheezes, non-labored, moving air well throughout all lung fields     Assessment & Plan:   Cough Suspect viral URI as cause of cough worsened by postnasal drip. Doubt PNA as lung sounds clear and equal, patient afebrile, and no hypoxia. Pain with coughing consistent with msk irritation/inflammation surrounding the ribs. Will prescribe guaifenesin-codeine for cough. Patient has history of nausea with this medication so will also prescribe Zofran. Rx for Tessalon given that patient can take while working. Recommended NSAIDs for msk pain. Recommended Mucinex and anti-histamine for nasal drainage. Patient to return if cough worsens significantly or does not improve over the next 1-2 weeks. Patient also to return for fevers, SOB, and chest pain.     Marcy Sirenatherine Gavinn Collard, D.O. 06/29/2016, 11:36 AM PGY-2, [facility]

## 2016-06-29 NOTE — Patient Instructions (Signed)
The cough syrup can be sedating so make sure you don't drive or work while taking it. I have prescribed Zofran which can help with nausea.   I have also prescribed Tessalon which helps with cough and is a pill. This is safe to take while working.   I would recommend Ibuprofen for your rib pain. The cough syrup will also help with pain.   Mucinex and and anti-histamine such as Benadryl, Claritin, or Zrytec are good for thinning and drying up the nasal discharge.    If you start having fevers, worsening of your cough despite this medications, or severe chest pain please seek medical treatment again.   Hope you feel better soon!

## 2016-07-23 ENCOUNTER — Telehealth: Payer: Self-pay | Admitting: Internal Medicine

## 2016-07-23 NOTE — Telephone Encounter (Signed)
Patient send by Dr. Earlene PlaterWallace on 06/29/16 for cough. Patient now states that the cough has now returned and she is requesting a refill on the Tessalon and guaifenesin-codeine cough syrup. Please advise.

## 2016-07-24 MED ORDER — BENZONATATE 200 MG PO CAPS: 200.0000 mg | ORAL_CAPSULE | Freq: Three times a day (TID) | ORAL | 0 refills | Status: DC | PRN

## 2016-07-24 NOTE — Telephone Encounter (Signed)
I have refilled patient's Tessalon. Given that her cough resolved and then returned, I would recommend that she be seen for an appointment as something different may be causing the cough than when I saw her 3-4 weeks ago. I can't refill the patient's Guaifenesin-Codeine without patient being seen as this is a controlled substance.  Marcy Sirenatherine Amaree Leeper, D.O. 07/24/2016, 9:39 AM PGY-2, [facility]

## 2016-07-24 NOTE — Telephone Encounter (Signed)
Contacted pt and scheduled her an appointment this Friday 2/16 with Dr. Kennon RoundsHaney since PCP is not available. [name], Rosselyn Martha D, [place]

## 2016-07-25 ENCOUNTER — Other Ambulatory Visit: Payer: Self-pay | Admitting: *Deleted

## 2016-07-25 MED ORDER — SITAGLIPTIN PHOSPHATE 50 MG PO TABS: 50.0000 mg | ORAL_TABLET | Freq: Every day | ORAL | 2 refills | Status: DC

## 2016-07-25 NOTE — Telephone Encounter (Signed)
Please call patient at tell them to make an appointment with me in the next month

## 2016-07-27 ENCOUNTER — Encounter: Payer: Self-pay | Admitting: Student

## 2016-07-27 ENCOUNTER — Ambulatory Visit (INDEPENDENT_AMBULATORY_CARE_PROVIDER_SITE_OTHER): Payer: BLUE CROSS/BLUE SHIELD | Admitting: Student

## 2016-07-27 DIAGNOSIS — R05 Cough: Secondary | ICD-10-CM | POA: Diagnosis not present

## 2016-07-27 DIAGNOSIS — R059 Cough, unspecified: Secondary | ICD-10-CM

## 2016-07-27 NOTE — Assessment & Plan Note (Signed)
Cough with congestion consistent with URI, no red flag symptoms by history or on physical exam - will continue current cough regimen - Alka seltzer cold and flu recommended - will follow with PCP as needed

## 2016-07-27 NOTE — Patient Instructions (Signed)
Follow up with your PCP as needed Take Alka- seltzer cold and flu for cold symptoms If you have questions or concerns, call the officer at [phone]

## 2016-07-27 NOTE — Progress Notes (Signed)
   Subjective:    Patient ID: Brandi GallusSherline Davenport, female    DOB: [birth year], 61 y.o.   MRN: [id number]   CC: cough  HPI: 61 y/o F presents for cough   Cough - seen on 1/19 for this and was started on tessalon perles and chertussin - she feels this has not helped ans still has cough - she denies SOB, chest pain, fevers - she has had general fatigue  - she has has congestion with runny nose  Smoking status reviewed  Review of Systems  Per HPI   Objective:  BP 132/80   Pulse 92   Temp 98.3 F (36.8 C) (Oral)   Ht 5\' 7"  (1.702 m)   Wt 240 lb (108.9 kg)   SpO2 99%   BMI 37.59 kg/m  Vitals and nursing note reviewed  General: NAD Cardiac: RRR Respiratory: CTAB, normal effort Skin: warm and dry, no rashes noted Neuro: alert and oriented, no focal deficits   Assessment & Plan:    Cough Cough with congestion consistent with URI, no red flag symptoms by history or on physical exam - will continue current cough regimen - Alka seltzer cold and flu recommended - will follow with PCP as needed    Cristine Daw A. Kennon RoundsHaney MD, MS Family Medicine Resident PGY-3 Pager [phone]

## 2016-07-27 NOTE — Telephone Encounter (Signed)
Pt informed and appt made. Fleeger, Jessica Dawn, CMA  

## 2016-08-15 ENCOUNTER — Telehealth: Payer: Self-pay | Admitting: *Deleted

## 2016-08-15 ENCOUNTER — Ambulatory Visit (INDEPENDENT_AMBULATORY_CARE_PROVIDER_SITE_OTHER): Payer: BLUE CROSS/BLUE SHIELD | Admitting: Internal Medicine

## 2016-08-15 ENCOUNTER — Encounter: Payer: Self-pay | Admitting: Internal Medicine

## 2016-08-15 VITALS — BP 128/76 | HR 69 | Temp 98.2°F | Wt 238.0 lb

## 2016-08-15 DIAGNOSIS — G44209 Tension-type headache, unspecified, not intractable: Secondary | ICD-10-CM | POA: Diagnosis not present

## 2016-08-15 DIAGNOSIS — F329 Major depressive disorder, single episode, unspecified: Secondary | ICD-10-CM | POA: Diagnosis not present

## 2016-08-15 DIAGNOSIS — E118 Type 2 diabetes mellitus with unspecified complications: Secondary | ICD-10-CM | POA: Diagnosis not present

## 2016-08-15 DIAGNOSIS — R42 Dizziness and giddiness: Secondary | ICD-10-CM

## 2016-08-15 LAB — CBC
HEMATOCRIT: 36.5 % (ref 35.0–45.0)
HEMOGLOBIN: 11.7 g/dL (ref 11.7–15.5)
MCH: 25.8 pg — ABNORMAL LOW (ref 27.0–33.0)
MCHC: 32.1 g/dL (ref 32.0–36.0)
MCV: 80.4 fL (ref 80.0–100.0)
MPV: 9.6 fL (ref 7.5–12.5)
Platelets: 305 10*3/uL (ref 140–400)
RBC: 4.54 MIL/uL (ref 3.80–5.10)
RDW: 15.2 % — AB (ref 11.0–15.0)
WBC: 4.6 10*3/uL (ref 3.8–10.8)

## 2016-08-15 LAB — BASIC METABOLIC PANEL WITH GFR
BUN: 17 mg/dL (ref 7–25)
CO2: 29 mmol/L (ref 20–31)
Calcium: 9.5 mg/dL (ref 8.6–10.4)
Chloride: 104 mmol/L (ref 98–110)
Creat: 0.67 mg/dL (ref 0.50–1.05)
GFR, Est African American: >89 mL/min (ref >=60)
GLUCOSE: 145 mg/dL — AB (ref 65–99)
POTASSIUM: 4.4 mmol/L (ref 3.5–5.3)
Sodium: 140 mmol/L (ref 135–146)

## 2016-08-15 LAB — TSH: TSH: 1.04 mIU/L

## 2016-08-15 LAB — POCT GLYCOSYLATED HEMOGLOBIN (HGB A1C): Hemoglobin A1C: 7

## 2016-08-15 MED ORDER — SITAGLIPTIN PHOSPHATE 50 MG PO TABS: 100.0000 mg | ORAL_TABLET | Freq: Every day | ORAL | 2 refills | Status: DC

## 2016-08-15 MED ORDER — TRAZODONE HCL 50 MG PO TABS
50.0000 mg | ORAL_TABLET | Freq: Every evening | ORAL | 3 refills | Status: DC | PRN
Start: 2016-08-15 — End: 2022-12-20

## 2016-08-15 MED ORDER — SITAGLIPTIN PHOSPHATE 100 MG PO TABS: 100.0000 mg | ORAL_TABLET | Freq: Every day | ORAL | 3 refills | Status: DC

## 2016-08-15 NOTE — Patient Instructions (Addendum)
I am glad you are feeling better overall. I want you to try to get more exercise as I think some of the dizziness you feel is due to not exercising your body. Try to get some more sleep. I have prescribed you some trazodone. I also want you to increase  Janvuia to 2 pills. Please follow up in a couple of months.

## 2016-08-15 NOTE — Progress Notes (Signed)
[facility] Kerrin Mo, MD Phone: [phone]  Reason For Visit: Follow up   Dizziness  - States having dizziness, started Monday - Associated with sitting to standing  - No hearing loss  - No focal weakness or decrease in sensation  - patient has had similar episodes of this in the past, used meclizine, dizziness improve - No nausea or vomiting associated with this  - Does not feel like the room is spinning around  - Drink well - Has not been able to sleep well at night - for the past week has not been sleeping   CHRONIC DM, Type 2: Reports no concerns, stopped Glimepiride, started Januvia 50 mg  CBGs: CBG- 120,130  Meds: Januvai  Reports good compliance. Tolerating well w/o side-effects Currently on ACEi / ARB  Lifestyle: Diet  -drinking sodas/sweet tea  / Exercise - going to start back walking when it is warmer Any hypoglycemia episodes: Denies palpations, diaphoresis, fatigue, weakness, jittery Denies polyuria, visual changes, numbness or tingling.  Major Depression  - Denies any issues with feeling sad or anxious lately - Patient was previously staying on [address], apparently someone died in the house. Patient had a lot of anxiety associated with guarding in this house. She states since she is she feels significantly better.  - Denies any SI  - Has been taking Zoloft no issues with this medication. denies any side effects   Headache  - Noted having temporal headaches, mentioned right at end of the visit. No nausea, focal changes associated with this issue -Headaches previously in the past  - Has not tried anything for the headache  - Not awaking from sleep, no nausea/vomiting, sudden weight loss, fever or chills    Past Medical History Reviewed problem list.  Medications- reviewed and updated No additions to family history Social history- patient is a non smoker  Objective: BP 128/76   Pulse 69   Temp 98.2 F (36.8 C) (Oral)    Wt 238 lb (108 kg)   SpO2 99%   BMI 37.28 kg/m  Gen: NAD, alert, cooperative with exam HENT: pain along temporal area bilaterally Cardio: regular rate and rhythm, S1S2 heard, no murmurs appreciated Pulm: clear to auscultation bilaterally, no wheezes, rhonchi or rales GI: soft, non-tender, non-distended, bowel sounds present, no hepatomegaly, no splenomegaly Neuro: Cn 2-7 intact Strength equal & normal in upper & lower extremities finger to nose wnl, no changes in sensation  HINT test performed - no abnormalities, no nystagmus  Skin: dry, intact, no rashes or lesions   Assessment/Plan: See problem based a/p  Dizziness Likely with orthostatic versus deconditioning versus decreased sleep over the past week. Normal neurologic exam. Normal hints exam. Unlikely BPPV or meniere's given lack of vertigo, or hearing loss, though provided with meclizine in th past  -Orthostatics normal - Provided sleep aid and discussed the importance of being active as well as keeping hydrated  DM type 2 (diabetes mellitus, type 2) A1C 7.0  Increased Januvia 100 mg daily  Continue ACE  Need to discuss opthalmology appointment at next visit   Major depressive disorder, single episode Well controlled per patient. No complaints.  Continue Zoloft    Acute non intractable tension-type headache Likely tension headache (headaches in the past), however temporal location with some tenders along temporal location bilaterally, unlikely temporal arteritis given bilateral nature, however will obtain ESR. No vision changes. Normal neurologic exam. No med tried. No red flags. - tylenol as needed  - will  check ESR

## 2016-08-15 NOTE — Telephone Encounter (Signed)
[facility] requesting to change Januvia 50 mg 2 tablets daily to 100 mg once daily.  Januvia 50 mg 2 tablets are not covered by insurance.  Clovis PuMartin, Pearly Bartosik L, RN

## 2016-08-15 NOTE — Telephone Encounter (Signed)
Sent in a new prescription for 100 mg Januvia, instead of 2 pills

## 2016-08-16 LAB — SEDIMENTATION RATE: Sed Rate: 20 mm/hr (ref 0–30)

## 2016-08-17 DIAGNOSIS — G44209 Tension-type headache, unspecified, not intractable: Secondary | ICD-10-CM | POA: Insufficient documentation

## 2016-08-17 NOTE — Assessment & Plan Note (Signed)
A1C 7.0  Increased Januvia 100 mg daily  Continue ACE  Need to discuss opthalmology appointment at next visit

## 2016-08-17 NOTE — Assessment & Plan Note (Addendum)
Likely tension headache (headaches in the past), however temporal location with some tenders along temporal location bilaterally, unlikely temporal arteritis given bilateral nature, however will obtain ESR. No vision changes. Normal neurologic exam. No med tried. No red flags. - tylenol as needed  - will check ESR

## 2016-08-17 NOTE — Assessment & Plan Note (Signed)
Well controlled per patient. No complaints.  Continue Zoloft

## 2016-08-17 NOTE — Assessment & Plan Note (Addendum)
Likely with orthostatic versus deconditioning versus decreased sleep over the past week. Normal neurologic exam. Normal hints exam. Unlikely BPPV or meniere's given lack of vertigo, or hearing loss, though provided with meclizine in th past  -Orthostatics normal - Provided sleep aid and discussed the importance of being active as well as keeping hydrated

## 2016-08-27 ENCOUNTER — Telehealth: Payer: Self-pay | Admitting: Internal Medicine

## 2016-08-27 NOTE — Telephone Encounter (Signed)
Pt was changed to Brandi Davenport about a week ago. She has had bad headache and dizzyness since then. She just doesn't feel right after she takes the medication. Her sugars are still running around 200. These are taken before she eats.  She is very concerned

## 2016-08-27 NOTE — Telephone Encounter (Signed)
Called patient to discuss blood sugars. Patient indicates that her blood sugars are around 100s, with today's CBG before eating this morning of  113 and then go up to 180/190 after eating. Reassured patient. Patient also noted to have a headache which she states improves with Tylenol, states she has a history of headaches, and was told by a doctor that has migraines. Denies this being the worst headache or  Sudden severe onset. Indicated that if patient does not improve with Tylenol she can take 200 mg of ibuprofen, however would not do this more than once to twice a week. If patient continues to have headaches she should make an appointment to seem me for evaluation.

## 2016-08-28 ENCOUNTER — Other Ambulatory Visit: Payer: Self-pay | Admitting: *Deleted

## 2016-08-28 DIAGNOSIS — I10 Essential (primary) hypertension: Secondary | ICD-10-CM

## 2016-08-29 ENCOUNTER — Encounter: Payer: Self-pay | Admitting: Internal Medicine

## 2016-08-29 MED ORDER — HYDROCHLOROTHIAZIDE 12.5 MG PO CAPS: 12.5000 mg | ORAL_CAPSULE | Freq: Every day | ORAL | 1 refills | Status: DC

## 2016-09-04 ENCOUNTER — Encounter: Payer: Self-pay | Admitting: Internal Medicine

## 2016-09-17 ENCOUNTER — Other Ambulatory Visit: Payer: Self-pay | Admitting: *Deleted

## 2016-09-17 MED ORDER — OMEPRAZOLE 20 MG PO CPDR: 20.0000 mg | DELAYED_RELEASE_CAPSULE | Freq: Every day | ORAL | 1 refills | Status: DC

## 2016-09-19 ENCOUNTER — Encounter: Payer: Self-pay | Admitting: Internal Medicine

## 2016-09-19 ENCOUNTER — Ambulatory Visit (INDEPENDENT_AMBULATORY_CARE_PROVIDER_SITE_OTHER): Payer: BLUE CROSS/BLUE SHIELD | Admitting: Internal Medicine

## 2016-09-19 DIAGNOSIS — E118 Type 2 diabetes mellitus with unspecified complications: Secondary | ICD-10-CM | POA: Diagnosis not present

## 2016-09-19 DIAGNOSIS — G44209 Tension-type headache, unspecified, not intractable: Secondary | ICD-10-CM | POA: Diagnosis not present

## 2016-09-19 NOTE — Progress Notes (Deleted)
   [name] [name], MD Phone: [phone]  Reason For Visit:   # -   Past Medical History Reviewed problem list.  Medications- reviewed and updated No additions to family history Social history- patient is a *** smoker  Objective: BP 128/75 (BP Location: Left Arm, Patient Position: Sitting, Cuff Size: Normal)   Pulse 78   Temp 98.1 F (36.7 C) (Oral)   Ht  (1.702 m)   Wt 238 lb 6.4 oz (108.1 kg)   SpO2 99%   BMI 37.34 kg/m  Gen: NAD, alert, cooperative with exam HEENT: Normal    Neck: No masses palpated. No lymphadenopathy    Ears: Tympanic membranes intact, normal light reflex, no erythema, no bulging    Eyes: PERRLA, EOMI    Nose: nasal turbinates moist    Throat: moist mucus membranes, no erythema Cardio: regular rate and rhythm, S1S2 heard, no murmurs appreciated Pulm: clear to auscultation bilaterally, no wheezes, rhonchi or rales GI: soft, non-tender, non-distended, bowel sounds present, no hepatomegaly, no splenomegaly GU: external vaginal tissue ***, cervix ***, *** punctate lesions on cervix appreciated, *** discharge from cervical os, *** bleeding, *** cervical motion tenderness, *** abdominal/ adnexal masses Extremities: warm, well perfused, No edema, cyanosis or clubbing;  MSK: Normal gait and station Skin: dry, intact, no rashes or lesions Neuro: Strength and sensation grossly intact   Assessment/Plan: See problem based a/p

## 2016-09-19 NOTE — Progress Notes (Signed)
   [name] [name], MD Phone: [phone]  Reason For Visit: Fatigue, F/U T2DM   # Fatigue  - Feels sluggish, patient feels like she wants to sleep all time  - Worried about the medications contributing, however has no new medications  - Still having headaches - tylenol helps resolve the headaches, these headaches are a constant nagging low grade pain, usually occur at work, bilateral frontal headache. Has this headache most days.  -Indicates that she started having all of these issues on January 17. -She states that she knows this because this was the day that her and her husband got into a huge argument and then had no resolution. They have been living together however they do not speak to each other at all. Nor interact with each other.  -She states that since this issue started she is only been able to sleep 3-4 hours every night. -She indicates feeling frustrated because he has erectile dysfunction and they have not been able to be sexually active for over a year  CHRONIC DM, Type 2: CBGs: Avg 130s  Low  120, High 190 (1x) Checks CBGs, morning fasting CBG Meds: Januvia 100 mg, previously on glipizide 2 mg BID - however was having hypoglycemia with this medication  Reports  good compliance. Tolerating well w/o side-effects Currently on ACEi / ARB  Any hypoglycemia episodes: Denies palpations, diaphoresis, fatigue, weakness, jittery Denies polyuria, visual changes, numbness or tingling.  Past Medical History Reviewed problem list.  Medications- reviewed and updated No additions to family history Social history- patient is a non- smoker  Objective: BP 128/75 (BP Location: Left Arm, Patient Position: Sitting, Cuff Size: Normal)   Pulse 78   Temp 98.1 F (36.7 C) (Oral)   Ht  (1.702 m)   Wt 238 lb 6.4 oz (108.1 kg)   SpO2 99%   BMI 37.34 kg/m  Gen: NAD, alert, cooperative with exam Cardio: regular rate and rhythm, S1S2 heard, no murmurs  appreciated Pulm: clear to auscultation bilaterally, no wheezes, rhonchi or rales Skin: dry, intact, no rashes or lesions  Assessment/Plan: See problem based a/p  Acute non intractable tension-type headache Headaches and fatigue likely due to stress associated with partner argument and lack of sleep over the last 3-4 months. -Discussed stopping television at night, patient to try to read a book. -Discussed going to sleep at a later time so that she does not wake up in the middle of the night -Discussed possibility of counseling, patient wants to hold off on this for now -Patient to try 50 mg of trazodone to help with sleep  -patient follow-up in one month    DM type 2 (diabetes mellitus, type 2) A1C 7.0 > slight increased from 6.8 at last visit. Patient was switched to Januvia over the past several months. Can not take metformin  - Will follow up in two weeks to determine next steps

## 2016-09-19 NOTE — Patient Instructions (Signed)
Please follow up in 2 weeks. I want to increase your dose of trazodone. Try not to watch tv if you wake up a night

## 2016-09-24 NOTE — Assessment & Plan Note (Signed)
Headaches and fatigue likely due to stress associated with partner argument and lack of sleep over the last 3-4 months. -Discussed stopping television at night, patient to try to read a book. -Discussed going to sleep at a later time so that she does not wake up in the middle of the night -Discussed possibility of counseling, patient wants to hold off on this for now -Patient to try 50 mg of trazodone to help with sleep  -patient follow-up in one month

## 2016-09-24 NOTE — Assessment & Plan Note (Signed)
A1C 7.0 > slight increased from 6.8 at last visit. Patient was switched to Januvia over the past several months. Can not take metformin  - Will follow up in two weeks to determine next steps

## 2016-10-01 ENCOUNTER — Other Ambulatory Visit: Payer: Self-pay | Admitting: *Deleted

## 2016-10-02 NOTE — Telephone Encounter (Signed)
2nd request.  Starsha Morning L, RN  

## 2016-10-03 ENCOUNTER — Ambulatory Visit (INDEPENDENT_AMBULATORY_CARE_PROVIDER_SITE_OTHER): Payer: BLUE CROSS/BLUE SHIELD | Admitting: Internal Medicine

## 2016-10-03 ENCOUNTER — Encounter: Payer: Self-pay | Admitting: Internal Medicine

## 2016-10-03 VITALS — BP 132/70 | HR 76 | Temp 98.2°F | Wt 236.8 lb

## 2016-10-03 DIAGNOSIS — E1342 Other specified diabetes mellitus with diabetic polyneuropathy: Secondary | ICD-10-CM | POA: Diagnosis not present

## 2016-10-03 DIAGNOSIS — E118 Type 2 diabetes mellitus with unspecified complications: Secondary | ICD-10-CM | POA: Diagnosis not present

## 2016-10-03 DIAGNOSIS — E1140 Type 2 diabetes mellitus with diabetic neuropathy, unspecified: Secondary | ICD-10-CM | POA: Insufficient documentation

## 2016-10-03 DIAGNOSIS — G44209 Tension-type headache, unspecified, not intractable: Secondary | ICD-10-CM

## 2016-10-03 MED ORDER — GABAPENTIN 100 MG PO CAPS: 100.0000 mg | ORAL_CAPSULE | Freq: Three times a day (TID) | ORAL | 0 refills | Status: DC

## 2016-10-03 MED ORDER — EMPAGLIFLOZIN 10 MG PO TABS: 10.0000 mg | ORAL_TABLET | Freq: Every day | ORAL | 0 refills | Status: DC

## 2016-10-03 MED ORDER — LOVASTATIN 40 MG PO TABS: 40.0000 mg | ORAL_TABLET | Freq: Every day | ORAL | 0 refills | Status: DC

## 2016-10-03 NOTE — Patient Instructions (Signed)
I want you to start taking Jardiance to help with your diabetes. For the foot burning, I want you to try gabapentin - this is likely neuropathic pain

## 2016-10-03 NOTE — Progress Notes (Signed)
   [facility] [name], MD Phone: [phone]  Reason For Visit: F/U   # CHRONIC DM, Type 2: Reports no concerns. Has been taking her Januvia 100 mg daily CBGs: AM CBGs 120-150s Checks Fasting CBGs Reports good compliance. Tolerating well w/o side-effects Currently on ACEi / ARB  Any hypoglycemia episodes: Denies palpations, diaphoresis, fatigue, weakness, jittery Denies polyuria, visual changes, numbness or tingling.  Headache and and sleep issues  # Previously patient was seen by me for headache and sleep issues -She now denies any headache and fatigue  -She's been taking the trazodone and feels much better now she is sleeping through the night -She is also stopped watching TV at night while trying to go to bed. -She is still not speaking to her husband indicates that the relationship between them is no better.   Neuropathy  - Indicates having burning sensation on bilateral feet - Has been going on for several weeks - Denies any history of gabapentin for neuropathic pain in the past  Past Medical History Reviewed problem list.  Medications- reviewed and updated No additions to family history Social history- patient is a non-smoker  Objective: BP 132/70   Pulse 76   Temp 98.2 F (36.8 C) (Oral)   Wt 236 lb 12.8 oz (107.4 kg)   SpO2 99%   BMI 37.09 kg/m  Gen: NAD, alert, cooperative with exam Cardio: regular rate and rhythm, S1S2 heard, no murmurs appreciated Pulm: clear to auscultation bilaterally, no wheezes, rhonchi or rales Extremities: No Obvious abnormalities on feet or lower extremities, patient with good strength, good sensation in feet. Skin: dry, intact, no rashes or lesions   Assessment/Plan: See problem based a/p  DM type 2 (diabetes mellitus, type 2) A1C 7.0, fasting CBGs 120-150. Currently on Januvia. Patient would like to have a little bit stricter control of her blood sugars. Therefore were going to start her on  Jardiance assuming her insurance will pay for it, otherwise we will continue as before.  - Will follow up in about 1 month  - Briefly discussed with Dr. Raymondo Band   Diabetic neuropathy [facility]) Bilateral feet burning ongoing for about two week - likely polyneuropathy. Sensation in feet intake  -Start low dose gabapentin   Acute non intractable tension-type headache Tension headache, now resolved with improved sleeping habits and the addition of trazodone

## 2016-10-03 NOTE — Progress Notes (Deleted)
   [name] [name], MD Phone: [phone]  Reason For Visit:   #   [name] [name], MD Phone: [phone]  Reason For Visit:   # *** -   Past Medical History Reviewed problem list.  Medications- reviewed and updated No additions to family history Social history- patient is a *** smoker  Objective: BP 132/70   Pulse 76   Temp 98.2 F (36.8 C) (Oral)   Wt 236 lb 12.8 oz (107.4 kg)   SpO2 99%   BMI 37.09 kg/m  Gen: NAD, alert, cooperative with exam HEENT: Normal    Neck: No masses palpated. No lymphadenopathy    Ears: Tympanic membranes intact, normal light reflex, no erythema, no bulging    Eyes: PERRLA, EOMI    Nose: nasal turbinates moist    Throat: moist mucus membranes, no erythema Cardio: regular rate and rhythm, S1S2 heard, no murmurs appreciated Pulm: clear to auscultation bilaterally, no wheezes, rhonchi or rales GI: soft, non-tender, non-distended, bowel sounds present, no hepatomegaly, no splenomegaly GU: external vaginal tissue ***, cervix ***, *** punctate lesions on cervix appreciated, *** discharge from cervical os, *** bleeding, *** cervical motion tenderness, *** abdominal/ adnexal masses Extremities: warm, well perfused, No edema, cyanosis or clubbing;  MSK: Normal gait and station Skin: dry, intact, no rashes or lesions Neuro: Strength and sensation grossly intact   Assessment/Plan: See problem based a/p  -   Past Medical History Reviewed problem list.  Medications- reviewed and updated No additions to family history Social history- patient is a *** smoker  Objective: BP 132/70   Pulse 76   Temp 98.2 F (36.8 C) (Oral)   Wt 236 lb 12.8 oz (107.4 kg)   SpO2 99%   BMI 37.09 kg/m  Gen: NAD, alert, cooperative with exam HEENT: Normal    Neck: No masses palpated. No lymphadenopathy    Ears: Tympanic membranes intact, normal light reflex, no erythema, no bulging  Eyes: PERRLA, EOMI    Nose: nasal turbinates moist    Throat: moist mucus membranes, no erythema Cardio: regular rate and rhythm, S1S2 heard, no murmurs appreciated Pulm: clear to auscultation bilaterally, no wheezes, rhonchi or rales GI: soft, non-tender, non-distended, bowel sounds present, no hepatomegaly, no splenomegaly GU: external vaginal tissue ***, cervix ***, *** punctate lesions on cervix appreciated, *** discharge from cervical os, *** bleeding, *** cervical motion tenderness, *** abdominal/ adnexal masses Extremities: warm, well perfused, No edema, cyanosis or clubbing;  MSK: Normal gait and station Skin: dry, intact, no rashes or lesions Neuro: Strength and sensation grossly intact   Assessment/Plan: See problem based a/p

## 2016-10-05 NOTE — Assessment & Plan Note (Signed)
Tension headache, now resolved with improved sleeping habits and the addition of trazodone

## 2016-10-05 NOTE — Assessment & Plan Note (Signed)
A1C 7.0, fasting CBGs 120-150. Currently on Januvia. Patient would like to have a little bit stricter control of her blood sugars. Therefore were going to start her on Jardiance assuming her insurance will pay for it, otherwise we will continue as before.  - Will follow up in about 1 month  - Briefly discussed with Dr. Raymondo Band

## 2016-10-05 NOTE — Assessment & Plan Note (Signed)
Bilateral feet burning ongoing for about two week - likely polyneuropathy. Sensation in feet intake  -Start low dose gabapentin

## 2016-11-01 ENCOUNTER — Encounter: Payer: Self-pay | Admitting: Family Medicine

## 2016-11-01 ENCOUNTER — Ambulatory Visit (INDEPENDENT_AMBULATORY_CARE_PROVIDER_SITE_OTHER): Payer: BLUE CROSS/BLUE SHIELD | Admitting: Family Medicine

## 2016-11-01 ENCOUNTER — Other Ambulatory Visit: Payer: Self-pay | Admitting: *Deleted

## 2016-11-01 VITALS — BP 118/78 | HR 88 | Temp 98.1°F | Wt 231.0 lb

## 2016-11-01 DIAGNOSIS — I10 Essential (primary) hypertension: Secondary | ICD-10-CM

## 2016-11-01 DIAGNOSIS — G2589 Other specified extrapyramidal and movement disorders: Secondary | ICD-10-CM | POA: Diagnosis not present

## 2016-11-01 DIAGNOSIS — M7581 Other shoulder lesions, right shoulder: Secondary | ICD-10-CM

## 2016-11-01 MED ORDER — HYDROCHLOROTHIAZIDE 12.5 MG PO CAPS: 12.5000 mg | ORAL_CAPSULE | Freq: Every day | ORAL | 1 refills | Status: DC

## 2016-11-01 MED ORDER — NAPROXEN 500 MG PO TABS: ORAL_TABLET | ORAL | 0 refills | Status: DC

## 2016-11-01 NOTE — Patient Instructions (Signed)
It was a pleasure seeing you today in our clinic. Today we discussed your right shoulder pain. This injury and the pain involved may take a long time to heal/resolve, so be patient. Here is the treatment plan I would like for you to follow:  For most musculoskeletal pain we physicians tend to recommend the "R.I.C.E." method of therapy. This stands for "Rest", "Ice", "Compression", and "Elevation". - Rest: I would like for you to take it easy for a period of time. This may be a period of days or weeks, everyone is different. Allow pain to be your guide. Achiness and soreness is to be expected, but true pain is to be avoided. - Ice: Using an ice pack 2-4 times a day for ~20 minutes each time can help with pain, swelling, and healing. - Compression: Using a compression sleeve or ACE Bandage (both available at most pharmacies) can help with swelling and pain. Having these on through the day is ideal, but do not sleep with these in place. - Elevation: It is not always possible to elevate an injury due to it's location on your body. However, whenever possible it would be beneficial to elevate musculoskeletal injuries above the level of the heart. (Example: propping an injured ankle up on pillows at night while asleep)  Naproxen and Tylenol: For most musculoskeletal pain I tell patients to alternate taking naproxen and Tylenol. Begin taking the naproxen 2 times a day (scheduled), then take the Tylenol inbetween each of the tylenol dosings as needed for additional pain (this allows for Tylenol to be taken up to 3 times a day).  Dosing: - 500mg  of naproxen 2 times a day (near max dose).  - 650-1000mg  of Tylenol 3 times a day (max dose).

## 2016-11-01 NOTE — Assessment & Plan Note (Signed)
Although patient had a limited exam due to pain there was some evidence of scapular dyskinesis. Patient was also noted to have a poor posture with forward tilted scapula. This likely has a direct effect on her most recent onset of shoulder pain, as poor scapular function can lead to impingement of the rotator cuff causing irritation and fraying of the tendon. - Physical therapy referral placed

## 2016-11-01 NOTE — Assessment & Plan Note (Signed)
Patient is here presenting with right-sided shoulder pain consistent with rotator cuff tendinitis, likely localized to the supraspinatus. Patient had a positive Neer test and empty can test. Strength was intact and passive range of motion was intact as well helping to rule out subacute full-thickness tear and adhesive capsulitis. Patient likely aggravated this tendon due to chronic use. She states that she had been "mopping a lot" while at work. She denies any injury or trauma. - Patient was offered a subacromial steroid injection but refused. - Naproxen 500 mg twice a day 5 days. Then 500 mg twice a day as needed after that. - Referral to physical therapy placed

## 2016-11-01 NOTE — Progress Notes (Signed)
   HPI  CC: Right shoulder pain. Started Monday. Aching and pain. Doing a lot of mopping for work. Has had this in the past (not sure when though). No prior injuries. Worse at night. Sleeps on the right side. Taking ibuprofen; helps. Denies recent trauma.  ROS: Denies fever, chills, weakness, numbness, paresthesias, swelling, erythema, or ecchymosis.  CC, SH/smoking status, and VS noted  Objective: BP 118/78   Pulse 88   Temp 98.1 F (36.7 C) (Oral)   Wt 231 lb (104.8 kg)   BMI 36.18 kg/m  Gen: NAD, alert, cooperative. CV: Well-perfused. Resp: Non-labored. Neuro: Sensation intact throughout. Shoulder, right: negative evidence of bony deformity, asymmetry, or muscle atrophy; negative for tenderness over long head of biceps (bicipital groove). negative for [facility] tenderness. Limited active range of motion due to pain, full passive range of motion, strength limited with forward flexion and abduction due to pain. IR and ER within normal limits. Thumb to T12. Scapular function unable to be fully assessed due to pain with abduction and forward flexion, however some evidence of abnormal scapular function able to be appreciated and forward positioned posture noted. Sensation intact. Peripheral pulses intact.  Special Tests:   - Crossarm test negative   - Empty can positive   - Hawkins unable to tolerate   - Neer test positive   - Yergason's negative   Assessment and plan:  Right rotator cuff tendinitis Patient is here presenting with right-sided shoulder pain consistent with rotator cuff tendinitis, likely localized to the supraspinatus. Patient had a positive Neer test and empty can test. Strength was intact and passive range of motion was intact as well helping to rule out subacute full-thickness tear and adhesive capsulitis. Patient likely aggravated this tendon due to chronic use. She states that she had been "mopping a lot" while at work. She denies any injury or trauma. - Patient  was offered a subacromial steroid injection but refused. - Naproxen 500 mg twice a day 5 days. Then 500 mg twice a day as needed after that. - Referral to physical therapy placed  Scapular dyskinesis Although patient had a limited exam due to pain there was some evidence of scapular dyskinesis. Patient was also noted to have a poor posture with forward tilted scapula. This likely has a direct effect on her most recent onset of shoulder pain, as poor scapular function can lead to impingement of the rotator cuff causing irritation and fraying of the tendon. - Physical therapy referral placed   Orders Placed This Encounter  Procedures  . Ambulatory referral to Physical Therapy    Referral Priority:   Routine    Referral Type:   Physical Medicine    Referral Reason:   Specialty Services Required    Requested Specialty:   Physical Therapy    Number of Visits Requested:   1    Meds ordered this encounter  Medications  . naproxen (NAPROSYN) 500 MG tablet    Sig: Take 1 tablet twice a day for 5 days. Then take 1 tablet twice a day as needed after that.    Dispense:  60 tablet    Refill:  0     Kathee DeltonIan D Zena Vitelli, MD,MS,  PGY3 11/01/2016 5:37 PM

## 2016-11-02 ENCOUNTER — Other Ambulatory Visit: Payer: Self-pay | Admitting: *Deleted

## 2016-11-02 DIAGNOSIS — E1342 Other specified diabetes mellitus with diabetic polyneuropathy: Secondary | ICD-10-CM

## 2016-11-02 DIAGNOSIS — E118 Type 2 diabetes mellitus with unspecified complications: Secondary | ICD-10-CM

## 2016-11-02 MED ORDER — SITAGLIPTIN PHOSPHATE 100 MG PO TABS: 100.0000 mg | ORAL_TABLET | Freq: Every day | ORAL | 3 refills | Status: DC

## 2016-11-02 MED ORDER — EMPAGLIFLOZIN 10 MG PO TABS: 10.0000 mg | ORAL_TABLET | Freq: Every day | ORAL | 0 refills | Status: DC

## 2016-11-02 MED ORDER — GABAPENTIN 100 MG PO CAPS: 100.0000 mg | ORAL_CAPSULE | Freq: Three times a day (TID) | ORAL | 0 refills | Status: DC

## 2016-11-15 ENCOUNTER — Ambulatory Visit: Payer: BLUE CROSS/BLUE SHIELD

## 2016-11-17 ENCOUNTER — Other Ambulatory Visit: Payer: Self-pay | Admitting: Internal Medicine

## 2016-11-17 DIAGNOSIS — I10 Essential (primary) hypertension: Secondary | ICD-10-CM

## 2016-11-21 ENCOUNTER — Encounter: Payer: Self-pay | Admitting: Physical Therapy

## 2016-11-21 ENCOUNTER — Ambulatory Visit: Payer: BLUE CROSS/BLUE SHIELD | Attending: Family Medicine | Admitting: Physical Therapy

## 2016-11-21 DIAGNOSIS — M6281 Muscle weakness (generalized): Secondary | ICD-10-CM | POA: Diagnosis present

## 2016-11-21 DIAGNOSIS — M25611 Stiffness of right shoulder, not elsewhere classified: Secondary | ICD-10-CM | POA: Diagnosis present

## 2016-11-21 DIAGNOSIS — R293 Abnormal posture: Secondary | ICD-10-CM | POA: Diagnosis present

## 2016-11-21 DIAGNOSIS — M25511 Pain in right shoulder: Secondary | ICD-10-CM

## 2016-11-21 NOTE — Therapy (Signed)
[facility] [address] [place], [place], [address] Phone: [phone]   Fax:  [phone]  Physical Therapy Evaluation  Patient Details  Name: Brandi Davenport MRN: [id number] Date of Birth: 20-Sep-1955 Referring Provider: Carney Living, MD  Encounter Date: 11/21/2016      PT End of Session - 11/21/16 1402    Visit Number 1   Number of Visits 13   Date for PT Re-Evaluation 01/02/17   PT Start Time 1315   PT Stop Time 1401   PT Time Calculation (min) 46 min   Activity Tolerance Patient tolerated treatment well   Behavior During Therapy [facility] for tasks assessed/performed      Past Medical History:  Diagnosis Date  . Diabetes mellitus without complication (HCC)   . Hyperlipidemia   . Hypertension     Past Surgical History:  Procedure Laterality Date  . ABDOMINAL HYSTERECTOMY    . tooth removal    . TUBAL LIGATION      There were no vitals filed for this visit.       Subjective Assessment - 11/21/16 1256    Subjective pt is a 61 y.o F with CC of R shoulder pain that started 7-8 days ago with no specific MOI, pt thinks it is due to using the arm alot at work. pain started initially in the shoulder but is progressing to the lower part of the R arm. denies N/T, and no popping/ clicking grinding. bothers most with reaching and laying down.    Limitations Lifting   How long can you sit comfortably? unlimited   How long can you stand comfortably? unlimited   How long can you walk comfortably? unlimited   Diagnostic tests no   Patient Stated Goals to decrease pain, improve motion. return to normal,    Currently in Pain? Yes   Pain Score 9   at worst 10/10   Pain Location Shoulder   Pain Orientation Right   Pain Descriptors / Indicators Sore;Nagging   Pain Onset In the past 7 days   Pain Frequency Constant   Aggravating Factors  laying down, reaching/ lifting with the RUE   Pain Relieving Factors tylenol. ice  pack, icy hot   Effect of Pain on Daily Activities getting dressed, doing her hair, work related lifting/ carrying            [facility] PT Assessment - 11/21/16 1259      Assessment   Medical Diagnosis R rotator cuff tendinitis, scapular dyskinesia   Referring Provider Carney Living, MD   Onset Date/Surgical Date --  7-8 days ago    Hand Dominance Right   Next MD Visit make one PRN   Prior Therapy no     Precautions   Precautions None     Restrictions   Weight Bearing Restrictions No     Balance Screen   Has the patient fallen in the past 6 months No   Has the patient had a decrease in activity level because of a fear of falling?  No   Is the patient reluctant to leave their home because of a fear of falling?  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, part time: cleaning office   Vocation Requirements lifting/ pushing, pulling, cleaning   Leisure movies, church, spending time with family     Cognition   Overall Cognitive Status Within Functional Limits for tasks assessed     Observation/Other Assessments   Focus on Therapeutic Outcomes (FOTO)  43% limited  predicted 25% limited     Posture/Postural Control   Posture/Postural Control Postural limitations   Postural Limitations Rounded Shoulders;Forward head     ROM / Strength   AROM / PROM / Strength AROM;Strength;PROM     AROM   AROM Assessment Site Shoulder   Right/Left Shoulder Right;Left   Right Shoulder Extension 52 Degrees  ERP   Right Shoulder Flexion 115 Degrees  ERP   Right Shoulder ABduction 96 Degrees   ERP   Right Shoulder Internal Rotation --  to stomach, assessed in nuetral   Right Shoulder External Rotation 64 Degrees  assessed in neutral    Left Shoulder Extension 62 Degrees   Left Shoulder Flexion 141 Degrees   Left Shoulder ABduction 105 Degrees   Left Shoulder Internal Rotation 82 Degrees  assessed at 90/90   Left Shoulder External Rotation 90 Degrees  assessed 90/90     PROM   PROM Assessment Site Shoulder   Right/Left Shoulder Right   Right Shoulder Flexion 116 Degrees  pt guarding at end range   Right Shoulder ABduction 110 Degrees   Right Shoulder Internal Rotation 60 Degrees   Right Shoulder External Rotation 72 Degrees  end range soreness     Strength   Strength Assessment Site Shoulder   Right/Left Shoulder Right;Left   Right Shoulder Flexion 3/5  pain during testing   Right Shoulder Extension 4/5   Right Shoulder ABduction 3+/5   Right Shoulder Internal Rotation 4-/5   Right Shoulder External Rotation 3+/5   Left Shoulder Flexion 4+/5   Left Shoulder Extension 5/5   Left Shoulder ABduction 4+/5   Left Shoulder Internal Rotation 4+/5   Left Shoulder External Rotation 4+/5   Right Hand Grip (lbs) 49  55, 46, 46   Left Hand Grip (lbs) 51  52,45,56     Palpation   Palpation comment TTP along along the R supraspinatus, infraspinatus and at the greater tubercle. tender at the sub-acromial space     Special Tests    Special Tests Rotator Cuff Impingement;Biceps/Labral Tests   Rotator Cuff Impingment tests Painful Arc of Motion;other;Full Can test;Empty Can test;Hawkins- Kennedy test     Hawkins-Kennedy test   Findings Negative   Side Right     Empty Can test   Findings Positive   Side Right   Comment worse than open cane     Full Can test   Findings Positive   Side Right     Painful Arc of Motion   Findings Positive   Side Right     other   Findings Positive   Side Right   Comments scapular assist testing            Objective  measurements completed on examination: See above findings.                  PT Education - 11/21/16 1402    Education provided Yes   Education Details evaluation findings, POC, goals, HEP with proper form/ rationale, anatomy of the area involved   Person(s) Educated Patient   Methods Explanation;Verbal  cues;Demonstration  used skeleton for explanation   Comprehension Verbalized understanding;Returned demonstration;Verbal cues required          PT Short Term Goals - 11/21/16 1408      PT SHORT TERM GOAL #1   Title pt to be I with inital HEP (12/12/2016)   Time 3   Period Weeks   Status New     PT SHORT TERM GOAL #2   Title pt to demo proper posture and lifting/ carrying biomechanics to prevent and reduce R shoulder pain (12/12/2016)   Time 3   Period Weeks   Status New     PT SHORT TERM GOAL #3   Title pt will increase Shoulder mobility by >/= 10 degrees with flexion/ abduction to promote functional shoulder mobility with </= 7/10 pain (12/12/2016)   Time 3   Period Weeks   Status New     PT SHORT TERM GOAL #4   Title increase R hand grip strength by >/= 6# to demo improving R shoulder function (12/12/2016)   Time 3   Period Weeks   Status New           PT Long Term Goals - 11/21/16 1410      PT LONG TERM GOAL #1   Title pt to improve R shoulder AROM flexion/ abduction to >/= 130 degrees and IR/ER to >/= 90 degrees for funtional mobility required for ADLs and personal hygiene (01/02/2017)   Time 6   Period Weeks   Status New     PT LONG TERM GOAL #2   Title increase R shoulder strength to >/= 4/5 with </ = 2/10 pain in all planes for strength required for ADLs (01/02/2017)   Time 6   Period Weeks   Status New     PT LONG TERM GOAL #3   Title she will be able to lift / carry and push/ pull >/= 10# with </=2/10 pain for work related tasks and ADLs (01/02/2017)   Time 6   Period Weeks   Status New     PT LONG TERM GOAL #4   Title Increase FOTO score to  </= 25% limited to demo improvement in function (01/02/2017)   Time 6   Period Weeks   Status New     PT LONG TERM GOAL #5   Title pt to be I with all HEP given as of last visit (01/02/2017)   Time 6   Period Weeks   Status New                Plan - 11/21/16 1403    Clinical Impression Statement pt presents to OPPT with CC of R shoulder pain that started insidously over 7-8 days ago. She demos limited mobility with guarding and pain noted at end or availabe ROM. limited strength in all planes with pain during testing compared bil. special testing is consist with dx of scapular dyskinisa with probable impingement. TTP at the sub-acromal space and at the supraspinatus / inraspinatus as well as the greater tubercle. She would benefit from physical therapy to reduce pain/ guarding, promote proper scapular humeral mobility/ mechanics, and increase strength to return pt to PLOF by addressing the deficits listed.   Clinical Presentation Stable   Clinical Decision Making Low   Rehab Potential Good   PT Frequency 2x / week   PT Duration 6 weeks   PT Treatment/Interventions ADLs/Self Care Home Management;Cryotherapy;Electrical Stimulation;Iontophoresis 4mg /ml Dexamethasone;Moist Heat;Ultrasound;Therapeutic activities;Therapeutic exercise;Patient/family education;Dry needling;Taping;Manual techniques   PT  Next Visit Plan assess/ review HEP, scapular mobs in all dir with emphasis on upward rotation, manual to calm down tightness, scapular stability strengthening, RC strengthening.    PT Home Exercise Plan wand flexion/ abduction, rows, upper trap stretch, Rows   Consulted and Agree with Plan of Care Patient      Patient will benefit from skilled therapeutic intervention in order to improve the following deficits and impairments:  Pain, Improper body mechanics, Decreased range of motion, Decreased strength, Increased fascial restricitons, Postural dysfunction, Decreased endurance, Decreased  activity tolerance  Visit Diagnosis: Acute pain of right shoulder - Plan: PT plan of care cert/re-cert  Stiffness of right shoulder, not elsewhere classified - Plan: PT plan of care cert/re-cert  Muscle weakness (generalized) - Plan: PT plan of care cert/re-cert  Abnormal posture - Plan: PT plan of care cert/re-cert     Problem List Patient Active Problem List   Diagnosis Date Noted  . Right rotator cuff tendinitis 11/01/2016  . Scapular dyskinesis 11/01/2016  . Diabetic neuropathy (HCC) 10/03/2016  . Constipation 11/04/2015  . Grief reaction 05/12/2015  . Generalized anxiety disorder 10/11/2014  . Incontinence 10/11/2014  . Mass 06/18/2014  . Major depressive disorder, single episode 01/10/2010  . GERD (gastroesophageal reflux disease) 02/25/2008  . HYPERLIPIDEMIA 03/13/2007  . DM type 2 (diabetes mellitus, type 2) (HCC) 08/08/2006  . HYPERTENSION, BENIGN SYSTEMIC 08/08/2006   Lulu RidingKristoffer Dan Dissinger PT, DPT, LAT, ATC  11/21/16  2:15 PM      [facility] [address] [place], [place], [address] Phone: [phone]   Fax:  [phone]  Name: Brandi Davenport MRN: [id number] Date of Birth: March 20, 1956

## 2016-12-03 ENCOUNTER — Other Ambulatory Visit: Payer: Self-pay | Admitting: *Deleted

## 2016-12-03 DIAGNOSIS — E118 Type 2 diabetes mellitus with unspecified complications: Secondary | ICD-10-CM

## 2016-12-03 MED ORDER — EMPAGLIFLOZIN 10 MG PO TABS: 10.0000 mg | ORAL_TABLET | Freq: Every day | ORAL | 0 refills | Status: DC

## 2016-12-04 ENCOUNTER — Ambulatory Visit: Payer: BLUE CROSS/BLUE SHIELD | Admitting: Physical Therapy

## 2016-12-04 ENCOUNTER — Encounter: Payer: Self-pay | Admitting: Physical Therapy

## 2016-12-04 DIAGNOSIS — M25511 Pain in right shoulder: Secondary | ICD-10-CM

## 2016-12-04 DIAGNOSIS — R293 Abnormal posture: Secondary | ICD-10-CM

## 2016-12-04 DIAGNOSIS — M25611 Stiffness of right shoulder, not elsewhere classified: Secondary | ICD-10-CM

## 2016-12-04 DIAGNOSIS — M6281 Muscle weakness (generalized): Secondary | ICD-10-CM

## 2016-12-04 NOTE — Therapy (Signed)
[facility] [address] [place], [place], [address] Phone: [phone]   Fax:  [phone]  Physical Therapy Treatment  Patient Details  Name: Brandi Davenport MRN: [id number] Date of Birth: Oct 31, 1955 Referring Provider: Carney Living, MD  Encounter Date: 12/04/2016      PT End of Session - 12/04/16 1642    Visit Number 2   Number of Visits 13   Date for PT Re-Evaluation 01/02/17   PT Start Time 1547   PT Stop Time 1642   PT Time Calculation (min) 55 min   Activity Tolerance Patient limited by pain   Behavior During Therapy [facility] for tasks assessed/performed      Past Medical History:  Diagnosis Date  . Diabetes mellitus without complication (HCC)   . Hyperlipidemia   . Hypertension     Past Surgical History:  Procedure Laterality Date  . ABDOMINAL HYSTERECTOMY    . tooth removal    . TUBAL LIGATION      There were no vitals filed for this visit.      Subjective Assessment - 12/04/16 1551    Subjective 8/10 now.  Able to do the exercises.  they hurt a little when she first gets started.   I hurt at night   Currently in Pain? Yes   Pain Score 9    Pain Location Shoulder   Pain Orientation Right   Pain Descriptors / Indicators Sore;Nagging   Pain Frequency Intermittent   Aggravating Factors  laying down, reaching cleaning   Pain Relieving Factors icy hot, tyenol,  cold pack   Effect of Pain on Daily Activities unable to work her second jonb,  sleep interrupted.    Multiple Pain Sites --  Back pain 8/10,  Left knee pain 9/10                         [facility] Adult PT Treatment/Exercise - 12/04/16 0001      Self-Care   Self-Care Posture   Posture sleepinf supine and on side,  sitting with rolled towel and lumbar support. ,  Patient able to relax ib supported position.      Shoulder Exercises: Supine   Protraction 10 reps  cane   Horizontal ABduction 10 reps   Horizontal ABduction  Limitations AA, no increased pain   Flexion 10 reps   Flexion Limitations cane, cued to avoid increased pain   Other Supine Exercises red band extension from about 40 degrees 2 X stopped due to pain.     Moist Heat Therapy   Number Minutes Moist Heat 15 Minutes   Moist Heat Location Shoulder     Electrical Stimulation   Electrical Stimulation Location shoulder   Electrical Stimulation Action IFC   Electrical Stimulation Parameters 6   Electrical Stimulation Goals Pain     Manual Therapy   Manual Therapy Scapular mobilization;Soft tissue mobilization   Manual therapy comments multiple painful areaes noted,  gentle scapular mobs,  pain increased to 10/10   Soft tissue mobilization light pressure and triggerpoint release .  tissue softened however patient noted increased pain of 10/10.     Scapular Mobilization small movements  hands medial and inferior, tender with presses,  medial lateral glides                 PT Education - 12/04/16 1641    Education provided Yes   Education Details Ways to exercise. Emphasis to avoid pain increase.  Stop with a mild stretch, avoid going into pain   Person(s) Educated Patient   Methods Explanation;Verbal cues   Comprehension Verbalized understanding          PT Short Term Goals - 11/21/16 1408      PT SHORT TERM GOAL #1   Title pt to be I with inital HEP (12/12/2016)   Time 3   Period Weeks   Status New     PT SHORT TERM GOAL #2   Title pt to demo proper posture and lifting/ carrying biomechanics to prevent and reduce R shoulder pain (12/12/2016)   Time 3   Period Weeks   Status New     PT SHORT TERM GOAL #3   Title pt will increase Shoulder mobility by >/= 10 degrees with flexion/ abduction to promote functional shoulder mobility with </= 7/10 pain (12/12/2016)   Time 3   Period Weeks   Status New     PT SHORT TERM GOAL #4   Title increase R hand grip strength by >/= 6# to demo improving R shoulder function (12/12/2016)    Time 3   Period Weeks   Status New           PT Long Term Goals - 11/21/16 1410      PT LONG TERM GOAL #1   Title pt to improve R shoulder AROM flexion/ abduction to >/= 130 degrees and IR/ER to >/= 90 degrees for funtional mobility required for ADLs and personal hygiene (01/02/2017)   Time 6   Period Weeks   Status New     PT LONG TERM GOAL #2   Title increase R shoulder strength to >/= 4/5 with </ = 2/10 pain in all planes for strength required for ADLs (01/02/2017)   Time 6   Period Weeks   Status New     PT LONG TERM GOAL #3   Title she will be able to lift / carry and push/ pull >/= 10# with </=2/10 pain for work related tasks and ADLs (01/02/2017)   Time 6   Period Weeks   Status New     PT LONG TERM GOAL #4   Title Increase FOTO score to </= 25% limited to demo improvement in function (01/02/2017)   Time 6   Period Weeks   Status New     PT LONG TERM GOAL #5   Title pt to be I with all HEP given as of last visit (01/02/2017)   Time 6   Period Weeks   Status New               Plan - 12/04/16 1643    Clinical Impression Statement Pain increased to 10/10 pain with manual.  Tender painful the whole upper quadrant.  Pain reduced to 0/10 post modalities.      PT Treatment/Interventions ADLs/Self Care Home Management;Cryotherapy;Electrical Stimulation;Iontophoresis 4mg /ml Dexamethasone;Moist Heat;Ultrasound;Therapeutic activities;Therapeutic exercise;Patient/family education;Dry needling;Taping;Manual techniques   PT Next Visit Plan contnue to review HEP add as able, consider isometrics, painfree.  Consider UE ranger.  ADL handout.    PT Home Exercise Plan wand flexion/ abduction, rows, upper trap stretch, Rows   Consulted and Agree with Plan of Care Patient      Patient will benefit from skilled therapeutic intervention in order to improve the following deficits and impairments:  Pain, Improper body mechanics, Decreased range of motion, Decreased strength,  Increased fascial restricitons, Postural dysfunction, Decreased endurance, Decreased activity tolerance  Visit Diagnosis: Acute pain of right shoulder  Stiffness of right shoulder, not elsewhere classified  Muscle weakness (generalized)  Abnormal posture     Problem List Patient Active Problem List   Diagnosis Date Noted  . Right rotator cuff tendinitis 11/01/2016  . Scapular dyskinesis 11/01/2016  . Diabetic neuropathy (HCC) 10/03/2016  . Constipation 11/04/2015  . Grief reaction 05/12/2015  . Generalized anxiety disorder 10/11/2014  . Incontinence 10/11/2014  . Mass 06/18/2014  . Major depressive disorder, single episode 01/10/2010  . GERD (gastroesophageal reflux disease) 02/25/2008  . HYPERLIPIDEMIA 03/13/2007  . DM type 2 (diabetes mellitus, type 2) (HCC) 08/08/2006  . HYPERTENSION, BENIGN SYSTEMIC 08/08/2006    Aunesty Tyson PTA 12/04/2016, 4:47 PM  [facility] [address] [place], [place], [address] Phone: [phone]   Fax:  [phone]  Name: Mishal Probert MRN: [id number] Date of Birth: 1955/09/27

## 2016-12-05 ENCOUNTER — Other Ambulatory Visit: Payer: Self-pay | Admitting: *Deleted

## 2016-12-05 DIAGNOSIS — E1342 Other specified diabetes mellitus with diabetic polyneuropathy: Secondary | ICD-10-CM

## 2016-12-05 MED ORDER — GABAPENTIN 100 MG PO CAPS: 100.0000 mg | ORAL_CAPSULE | Freq: Three times a day (TID) | ORAL | 0 refills | Status: DC

## 2016-12-06 ENCOUNTER — Encounter: Payer: Self-pay | Admitting: Physical Therapy

## 2016-12-06 ENCOUNTER — Ambulatory Visit: Payer: BLUE CROSS/BLUE SHIELD | Admitting: Physical Therapy

## 2016-12-06 DIAGNOSIS — M25511 Pain in right shoulder: Secondary | ICD-10-CM

## 2016-12-06 DIAGNOSIS — R293 Abnormal posture: Secondary | ICD-10-CM

## 2016-12-06 DIAGNOSIS — M25611 Stiffness of right shoulder, not elsewhere classified: Secondary | ICD-10-CM

## 2016-12-06 DIAGNOSIS — M6281 Muscle weakness (generalized): Secondary | ICD-10-CM

## 2016-12-06 NOTE — Therapy (Signed)
[facility] [address] [place], [place], [address] Phone: [phone]   Fax:  [phone]  Physical Therapy Treatment  Patient Details  Name: Brandi Davenport MRN: [id number] Date of Birth: [birth year] Referring Provider: Carney Living, MD  Encounter Date: 12/06/2016      PT End of Session - 12/06/16 1630    Visit Number 3   Number of Visits 13   Date for PT Re-Evaluation 01/02/17   PT Start Time 1549   PT Stop Time 1630   PT Time Calculation (min) 41 min   Activity Tolerance Patient tolerated treatment well   Behavior During Therapy [facility] for tasks assessed/performed      Past Medical History:  Diagnosis Date  . Diabetes mellitus without complication (HCC)   . Hyperlipidemia   . Hypertension     Past Surgical History:  Procedure Laterality Date  . ABDOMINAL HYSTERECTOMY    . tooth removal    . TUBAL LIGATION      There were no vitals filed for this visit.      Subjective Assessment - 12/06/16 1558    Subjective "I am doing pretty good, only having 6/10"    Currently in Pain? Yes   Pain Score 6    Pain Location Shoulder   Pain Orientation Right   Pain Type Chronic pain   Pain Onset More than a month ago   Pain Frequency Intermittent            [facility] PT Assessment - 12/06/16 1617      AROM   Right Shoulder Extension 60 Degrees   Right Shoulder Flexion 130 Degrees   Right Shoulder ABduction 112 Degrees   Right Shoulder Internal Rotation 28 Degrees  assessed at 90/90   Right Shoulder External Rotation 70 Degrees  assessed at 90/90                     [facility] Adult PT Treatment/Exercise - 12/06/16 1602      Shoulder Exercises: Supine   Protraction Strengthening;12 reps;Both  using dowel x 2 sets     Shoulder Exercises: Standing   External Rotation Strengthening;Right;12 reps;Theraband   Theraband Level (Shoulder External Rotation) Level 1 (Yellow)   Internal Rotation  Strengthening;Right;12 reps;Theraband   Theraband Level (Shoulder Internal Rotation) Level 1 (Yellow)   Row Strengthening;Both;12 reps;Theraband   Theraband Level (Shoulder Row) Level 1 (Yellow)     Shoulder Exercises: ROM/Strengthening   UBE (Upper Arm Bike) L1 x 6 min   foward 3 min, back 3 min   Other ROM/Strengthening Exercises bil shoulder flexoin with dowel in supine     Shoulder Exercises: Stretch   Other Shoulder Stretches 2 x 30 sec hold   levator scapulae   Other Shoulder Stretches 2 x 30 sec hold upper trap                PT Education - 12/06/16 1630    Education provided Yes   Education Details reviewed and updated HEP for shoulder IR/ ER    Person(s) Educated Patient   Methods Explanation;Verbal cues;Handout   Comprehension Verbalized understanding;Verbal cues required          PT Short Term Goals - 11/21/16 1408      PT SHORT TERM GOAL #1   Title pt to be I with inital HEP (12/12/2016)   Time 3   Period Weeks   Status New     PT SHORT TERM GOAL #2  Title pt to demo proper posture and lifting/ carrying biomechanics to prevent and reduce R shoulder pain (12/12/2016)   Time 3   Period Weeks   Status New     PT SHORT TERM GOAL #3   Title pt will increase Shoulder mobility by >/= 10 degrees with flexion/ abduction to promote functional shoulder mobility with </= 7/10 pain (12/12/2016)   Time 3   Period Weeks   Status New     PT SHORT TERM GOAL #4   Title increase R hand grip strength by >/= 6# to demo improving R shoulder function (12/12/2016)   Time 3   Period Weeks   Status New           PT Long Term Goals - 11/21/16 1410      PT LONG TERM GOAL #1   Title pt to improve R shoulder AROM flexion/ abduction to >/= 130 degrees and IR/ER to >/= 90 degrees for funtional mobility required for ADLs and personal hygiene (01/02/2017)   Time 6   Period Weeks   Status New     PT LONG TERM GOAL #2   Title increase R shoulder strength to >/= 4/5 with </  = 2/10 pain in all planes for strength required for ADLs (01/02/2017)   Time 6   Period Weeks   Status New     PT LONG TERM GOAL #3   Title Brandi Davenport will be able to lift / carry and push/ pull >/= 10# with </=2/10 pain for work related tasks and ADLs (01/02/2017)   Time 6   Period Weeks   Status New     PT LONG TERM GOAL #4   Title Increase FOTO score to </= 25% limited to demo improvement in function (01/02/2017)   Time 6   Period Weeks   Status New     PT LONG TERM GOAL #5   Title pt to be I with all HEP given as of last visit (01/02/2017)   Time 6   Period Weeks   Status New               Plan - 12/06/16 1652    Clinical Impression Statement Brandi Davenport reports 6/10 intially today. focused on scapular stabilizers and began strengthening rotator cuff muscles which Brandi Davenport performed well and was given to take home for HEP.  Brandi Davenport is progressing well with ROM in all planes and additionally reported 0/10 pain post session.    PT Next Visit Plan contnue to review HEP scap stabilizers, rotator cuff,  Consider UE ranger.  ADL handout.    PT Home Exercise Plan wand flexion/ abduction, rows, upper trap stretch, Rows, IR/ER and rows with yellow band   Consulted and Agree with Plan of Care Patient      Patient will benefit from skilled therapeutic intervention in order to improve the following deficits and impairments:  Pain, Improper body mechanics, Decreased range of motion, Decreased strength, Increased fascial restricitons, Postural dysfunction, Decreased endurance, Decreased activity tolerance  Visit Diagnosis: Acute pain of right shoulder  Stiffness of right shoulder, not elsewhere classified  Muscle weakness (generalized)  Abnormal posture     Problem List Patient Active Problem List   Diagnosis Date Noted  . Right rotator cuff tendinitis 11/01/2016  . Scapular dyskinesis 11/01/2016  . Diabetic neuropathy (HCC) 10/03/2016  . Constipation 11/04/2015  . Grief reaction  05/12/2015  . Generalized anxiety disorder 10/11/2014  . Incontinence 10/11/2014  . Mass 06/18/2014  . Major depressive  disorder, single episode 01/10/2010  . GERD (gastroesophageal reflux disease) 02/25/2008  . HYPERLIPIDEMIA 03/13/2007  . DM type 2 (diabetes mellitus, type 2) (HCC) 08/08/2006  . HYPERTENSION, BENIGN SYSTEMIC 08/08/2006   Lulu RidingKristoffer Leamon PT, DPT, LAT, ATC  12/06/16  4:58 PM      [facility] [address] [place], [place], [address] Phone: [phone]   Fax:  [phone]  Name: Brandi Davenport MRN: [id number] Date of Birth: 1955/07/03

## 2016-12-10 ENCOUNTER — Ambulatory Visit: Payer: BLUE CROSS/BLUE SHIELD | Attending: Family Medicine | Admitting: Physical Therapy

## 2016-12-10 ENCOUNTER — Encounter: Payer: Self-pay | Admitting: Physical Therapy

## 2016-12-10 DIAGNOSIS — M25611 Stiffness of right shoulder, not elsewhere classified: Secondary | ICD-10-CM

## 2016-12-10 DIAGNOSIS — M25511 Pain in right shoulder: Secondary | ICD-10-CM

## 2016-12-10 DIAGNOSIS — R293 Abnormal posture: Secondary | ICD-10-CM

## 2016-12-10 DIAGNOSIS — M6281 Muscle weakness (generalized): Secondary | ICD-10-CM | POA: Diagnosis present

## 2016-12-10 NOTE — Therapy (Addendum)
[place], [place], [address] Phone: [phone]   Fax:  [phone]  Physical Therapy Treatment / Discharge Note  Patient Details  Name: Brandi Davenport MRN: [id number] Date of Birth: 1956/03/06 Referring Provider: Lind Covert, MD  Encounter Date: 12/10/2016      PT End of Session - 12/10/16 1627    Visit Number 4   Number of Visits 13   Date for PT Re-Evaluation 01/02/17   PT Start Time 6962   PT Stop Time 1627   PT Time Calculation (min) 41 min   Activity Tolerance Patient tolerated treatment well   Behavior During Therapy [facility] for tasks assessed/performed      Past Medical History:  Diagnosis Date  . Diabetes mellitus without complication ([place])   . Hyperlipidemia   . Hypertension     Past Surgical History:  Procedure Laterality Date  . ABDOMINAL HYSTERECTOMY    . tooth removal    . TUBAL LIGATION      There were no vitals filed for this visit.      Subjective Assessment - 12/10/16 1550    Subjective "no pain today, only soreness when I am pushing down using the mop"   Currently in Pain? No/denies   Pain Score 0-No pain   Pain Location Shoulder   Pain Orientation Right                         [facility] Adult PT Treatment/Exercise - 12/10/16 1550      Self-Care   Posture avoiding hiking shoulder when pushing mop down at work     Shoulder Exercises: Standing   Protraction Strengthening;Right;10 reps;Theraband  x 2 sets   Theraband Level (Shoulder Protraction) Level 3 (Green)   External Rotation Strengthening;Right;12 reps;Theraband   Theraband Level (Shoulder External Rotation) Level 3 (Green)   Internal Rotation Strengthening;Right;15 reps   Theraband Level (Shoulder Internal Rotation) Level 3 (Green)   Extension Strengthening;Right;15 reps;Theraband   Theraband Level (Shoulder Extension) Level 3 (Green)   Other Standing Exercises standing tricep press  13#  to mimick pressing mop down   Other Standing Exercises sliding arms up the wall and lifting off the wall 2 x 15   for lower trap activation.     Shoulder Exercises: ROM/Strengthening   UBE (Upper Arm Bike) L1 x 6 min  changing direction at 3 min     Shoulder Exercises: Stretch   Other Shoulder Stretches 2 x 30 sec hold   upper trap   Other Shoulder Stretches 2 x 30 sec hold upper trap     Manual Therapy   Manual therapy comments manual trigger point release over the R upper trap                  PT Short Term Goals - 11/21/16 1408      PT SHORT TERM GOAL #1   Title pt to be I with inital HEP (12/12/2016)   Time 3   Period Weeks   Status New     PT SHORT TERM GOAL #2   Title pt to demo proper posture and lifting/ carrying biomechanics to prevent and reduce R shoulder pain (12/12/2016)   Time 3   Period Weeks   Status New     PT SHORT TERM GOAL #3   Title pt will increase Shoulder mobility by >/= 10 degrees with flexion/ abduction to promote functional shoulder mobility with </= 7/10 pain (  12/12/2016)   Time 3   Period Weeks   Status New     PT SHORT TERM GOAL #4   Title increase R hand grip strength by >/= 6# to demo improving R shoulder function (12/12/2016)   Time 3   Period Weeks   Status New           PT Long Term Goals - 11/21/16 1410      PT LONG TERM GOAL #1   Title pt to improve R shoulder AROM flexion/ abduction to >/= 130 degrees and IR/ER to >/= 90 degrees for funtional mobility required for ADLs and personal hygiene (01/02/2017)   Time 6   Period Weeks   Status New     PT LONG TERM GOAL #2   Title increase R shoulder strength to >/= 4/5 with </ = 2/10 pain in all planes for strength required for ADLs (01/02/2017)   Time 6   Period Weeks   Status New     PT LONG TERM GOAL #3   Title she will be able to lift / carry and push/ pull >/= 10# with </=2/10 pain for work related tasks and ADLs (01/02/2017)   Time 6   Period Weeks   Status New      PT LONG TERM GOAL #4   Title Increase FOTO score to </= 25% limited to demo improvement in function (01/02/2017)   Time 6   Period Weeks   Status New     PT LONG TERM GOAL #5   Title pt to be I with all HEP given as of last visit (01/02/2017)   Time 6   Period Weeks   Status New               Plan - 12/10/16 1627    Clinical Impression Statement pt continus to progress with treatment reporting no pain today, and only has pain with mopping. pt utilizes upper trap activation with mopping which re-created activity and pt was able to correct utilizing triceps which she reported no pain. continued shoulder scapular stability exercises which she reported no increase in pain.   PT Treatment/Interventions ADLs/Self Care Home Management;Cryotherapy;Electrical Stimulation;Iontophoresis 2m/ml Dexamethasone;Moist Heat;Ultrasound;Therapeutic activities;Therapeutic exercise;Patient/family education;Dry needling;Taping;Manual techniques   PT Next Visit Plan contnue to review HEP scap stabilizers, rotator cuff,  Consider UE ranger.  ADL handout.    Consulted and Agree with Plan of Care Patient      Patient will benefit from skilled therapeutic intervention in order to improve the following deficits and impairments:  Pain, Improper body mechanics, Decreased range of motion, Decreased strength, Increased fascial restricitons, Postural dysfunction, Decreased endurance, Decreased activity tolerance  Visit Diagnosis: Acute pain of right shoulder  Stiffness of right shoulder, not elsewhere classified  Muscle weakness (generalized)  Abnormal posture     Problem List Patient Active Problem List   Diagnosis Date Noted  . Right rotator cuff tendinitis 11/01/2016  . Scapular dyskinesis 11/01/2016  . [place] ([place] 10/03/2016  . Constipation 11/04/2015  . Grief reaction 05/12/2015  . Generalized anxiety disorder 10/11/2014  . Incontinence 10/11/2014  . Mass 06/18/2014  .  Major depressive disorder, single episode 01/10/2010  . GERD (gastroesophageal reflux disease) 02/25/2008  . HYPERLIPIDEMIA 03/13/2007  . DM type 2 (diabetes mellitus, type 2) ([place] 08/08/2006  . HYPERTENSION, BENIGN SYSTEMIC 08/08/2006   KStarr LakePT, DPT, LAT, ATC  12/10/16  4:31 PM      [facility] [address]  [place], [address] Phone: [phone]   Fax:  [phone]  Name: Shawneen Deetz MRN: [id number] Date of Birth: 07-Feb-1956      PHYSICAL THERAPY DISCHARGE SUMMARY  Visits from Start of Care: 4  Current functional level related to goals / functional outcomes: See goals   Remaining deficits: As of last attended visit pt was doing well with minimal pain/ and mild limited shoulder ROM.    Education / Equipment: HEP, theraband.   Plan: Patient agrees to discharge.  Patient goals were partially met. Patient is being discharged due to not returning since the last visit.  ?????     Quashawn Jewkes PT, DPT, LAT, ATC  01/24/17  10:43 AM

## 2016-12-11 ENCOUNTER — Ambulatory Visit: Payer: BLUE CROSS/BLUE SHIELD | Admitting: Physical Therapy

## 2016-12-14 ENCOUNTER — Encounter: Payer: Self-pay | Admitting: Family Medicine

## 2016-12-14 ENCOUNTER — Ambulatory Visit (INDEPENDENT_AMBULATORY_CARE_PROVIDER_SITE_OTHER): Payer: BLUE CROSS/BLUE SHIELD | Admitting: Family Medicine

## 2016-12-14 ENCOUNTER — Ambulatory Visit: Payer: Worker's Compensation | Admitting: Internal Medicine

## 2016-12-14 VITALS — BP 110/64 | HR 92 | Temp 98.8°F | Ht 67.0 in | Wt 226.0 lb

## 2016-12-14 DIAGNOSIS — Z1211 Encounter for screening for malignant neoplasm of colon: Secondary | ICD-10-CM | POA: Diagnosis not present

## 2016-12-14 DIAGNOSIS — M7581 Other shoulder lesions, right shoulder: Secondary | ICD-10-CM

## 2016-12-14 DIAGNOSIS — E118 Type 2 diabetes mellitus with unspecified complications: Secondary | ICD-10-CM | POA: Diagnosis not present

## 2016-12-14 LAB — POCT GLYCOSYLATED HEMOGLOBIN (HGB A1C): Hemoglobin A1C: 6.7

## 2016-12-14 MED ORDER — SITAGLIPTIN PHOSPHATE 100 MG PO TABS: 100.0000 mg | ORAL_TABLET | Freq: Every day | ORAL | 3 refills | Status: DC

## 2016-12-14 MED ORDER — EMPAGLIFLOZIN 10 MG PO TABS: 10.0000 mg | ORAL_TABLET | Freq: Every day | ORAL | 0 refills | Status: DC

## 2016-12-14 NOTE — Patient Instructions (Addendum)
It was a pleasure meeting her today. As per discussion, her diabetes management is going very well. I will refill your medications as requested. You also have a note allowing him to return to work. He will also be given instructions at the front of lab for your colon cancer screening. Please follow up in 6 months for diabetes recheck. Please feel free to call us back if he have any issues.  Thomes DinningBrad Thompson, MD, MS FAMILY MEDICINE RESIDENT - PGY1 12/14/2016 2:18 PM

## 2016-12-14 NOTE — Assessment & Plan Note (Signed)
Patient is currently at goal with her A1c under 7. We will continue her Jardiance and Januvia. Patient to follow-up in 6 months for recheck of A1c.

## 2016-12-14 NOTE — Progress Notes (Signed)
    Subjective:  Brandi Davenport is a 61 y.o. female [facility] To follow up with changes in diabetes management.   HPI:  Diabetes mellitus: Chronic: Improving Patient presents for follow-up on recent changes to diabetes management specifically the addition of Jardiance. She reports that she is tolerating this well except for slight increase in urination and frequency. However, she denies any increased urgency, dysuria, or noticing any hematuria. She is also lost 5 pounds recently.   Lab Results  Component Value Date   HGBA1C 6.7 12/14/2016   HGBA1C 7.0 08/15/2016   HGBA1C 6.8 05/15/2016    Rotator cuff tear: Patient recently had rotator cuff surgery and outpatient rehabilitation. She is tolerating this well without any pain. She would like to return back to work   ROS: Per HPI.   Social: Patient is not working and would like to return to work.    Objective:  Physical Exam: BP 110/64   Pulse 92   Temp 98.8 F (37.1 C) (Oral)   Ht 5\' 7"  (1.702 m)   Wt 226 lb (102.5 kg)   SpO2 99%   BMI 35.40 kg/m   Gen: NAD, resting comfortably CV: RRR with no murmurs appreciated Pulm: NWOB, CTAB with no crackles, wheezes, or rhonchi MSK: normal ROM in shoulder bilaterally, nontender to palpation  Results for orders placed or performed in visit on 12/14/16 (from the past 72 hour(s))  HgB A1c     Status: None   Collection Time: 12/14/16  1:47 PM  Result Value Ref Range   Hemoglobin A1C 6.7      Assessment/Plan:  DM type 2 (diabetes mellitus, type 2) Patient is currently at goal with her A1c under 7. We will continue her Jardiance and Januvia. Patient to follow-up in 6 months for recheck of A1c.   Right rotator cuff tendinitis Patient denies any pain in regular range of motion. I feel that she can return to work  Screening for colon cancer Patient was advised to get routine colonoscopy for SCREENING. She did not want to pursue this option so she was amenable  to performing fit screening at home.     Brandi DinningBrad Merrissa Giacobbe, MD, MS FAMILY MEDICINE RESIDENT - PGY1 12/14/2016 2:38 PM

## 2016-12-14 NOTE — Assessment & Plan Note (Signed)
Patient denies any pain in regular range of motion. I feel that she can return to work

## 2016-12-14 NOTE — Assessment & Plan Note (Signed)
Patient was advised to get routine colonoscopy for SCREENING. She did not want to pursue this option so she was amenable to performing fit screening at home.

## 2016-12-18 ENCOUNTER — Ambulatory Visit: Payer: BLUE CROSS/BLUE SHIELD | Admitting: Physical Therapy

## 2016-12-20 ENCOUNTER — Encounter: Payer: Self-pay | Admitting: Physical Therapy

## 2016-12-25 ENCOUNTER — Encounter: Payer: Self-pay | Admitting: Physical Therapy

## 2016-12-27 ENCOUNTER — Encounter: Payer: Self-pay | Admitting: Physical Therapy

## 2017-01-01 ENCOUNTER — Other Ambulatory Visit: Payer: Self-pay | Admitting: *Deleted

## 2017-01-01 MED ORDER — LOVASTATIN 40 MG PO TABS: 40.0000 mg | ORAL_TABLET | Freq: Every day | ORAL | 0 refills | Status: DC

## 2017-01-14 ENCOUNTER — Encounter: Payer: Self-pay | Admitting: Internal Medicine

## 2017-01-14 ENCOUNTER — Ambulatory Visit (INDEPENDENT_AMBULATORY_CARE_PROVIDER_SITE_OTHER): Payer: BLUE CROSS/BLUE SHIELD | Admitting: Internal Medicine

## 2017-01-14 VITALS — BP 122/78 | HR 88 | Temp 98.1°F | Ht 67.0 in | Wt 227.0 lb

## 2017-01-14 DIAGNOSIS — R350 Frequency of micturition: Secondary | ICD-10-CM | POA: Diagnosis not present

## 2017-01-14 LAB — POCT URINALYSIS DIP (MANUAL ENTRY)
BILIRUBIN UA: NEGATIVE mg/dL
Bilirubin, UA: NEGATIVE
Blood, UA: NEGATIVE
Glucose, UA: >=1000 mg/dL — AB
LEUKOCYTES UA: NEGATIVE
NITRITE UA: NEGATIVE
PH UA: 6 (ref 5.0–8.0)
PROTEIN UA: NEGATIVE mg/dL
Spec Grav, UA: >=1.03 — AB (ref 1.010–1.025)
UROBILINOGEN UA: 0.2 U/dL

## 2017-01-14 LAB — GLUCOSE, POCT (MANUAL RESULT ENTRY): POC Glucose: 130 mg/dl — AB (ref 70–99)

## 2017-01-14 NOTE — Progress Notes (Signed)
   [facility] Brandi CharsAsiyah Mikell, MD Phone: [phone]  Reason For Visit: Polyuria    - Patient states for the past week she has had polyuria. She denies any dysuria, vaginal irritation,. She states that she has to go to the bathroom often. She denies any changes in her urine coloration. Patient was recently placed on jardiance about 2 months aog. Last week she had her A1c checked which was 6.7. Patient denies drinking more fluids than she normally does. Diet is unchanged: Breakfast: steak and egg biscuit, diet seven up or diet sun kiss , Lunch: Fish philly combo and diet coke, No snacks. Other than drinking a diet Coke daily she denies having significant caffeine intake. CBGs patient has been checking daily and are around 120 on avg.   Past Medical History Reviewed problem list.  Medications- reviewed and updated No additions to family history Social history- patient is a non- smoker  Objective: BP 122/78 (BP Location: Right Arm, Patient Position: Sitting, Cuff Size: Large)   Pulse 88   Temp 98.1 F (36.7 C) (Oral)   Ht 5\' 7"  (1.702 m)   Wt 227 lb (103 kg)   SpO2 100%   BMI 35.55 kg/m  Gen: NAD, alert, cooperative with exam Cardio: regular rate and rhythm, S1S2 heard, no murmurs appreciated Pulm: clear to auscultation bilaterally, no wheezes, rhonchi or rales GI: soft, non-tender, non-distended, bowel sounds present, no hepatomegaly, no splenomegaly Skin: dry, intact, no rashes or lesions  Assessment/Plan: See problem based a/p  Urinary frequency UA negative for signs of infection, POCT glucose 135 - no concerns about worsening diabetes per hx. Concern that because Jardiance is a diuretic patient is just having significant side effects from this medication  - Stop Jardiance - Follow up in 1 week  - POCT urinalysis dipstick - Glucose (CBG)

## 2017-01-14 NOTE — Progress Notes (Deleted)
   [facility] Brandi CharsAsiyah Raechal Raben, MD Phone: [phone]  Reason For Visit:   # *** -   Past Medical History Reviewed problem list.  Medications- reviewed and updated No additions to family history Social history- patient is a *** smoker  Objective: BP 122/78 (BP Location: Right Arm, Patient Position: Sitting, Cuff Size: Large)   Pulse 88   Temp 98.1 F (36.7 C) (Oral)   SpO2 100%  Gen: NAD, alert, cooperative with exam HEENT: Normal    Neck: No masses palpated. No lymphadenopathy    Ears: Tympanic membranes intact, normal light reflex, no erythema, no bulging    Eyes: PERRLA, EOMI    Nose: nasal turbinates moist    Throat: moist mucus membranes, no erythema Cardio: regular rate and rhythm, S1S2 heard, no murmurs appreciated Pulm: clear to auscultation bilaterally, no wheezes, rhonchi or rales GI: soft, non-tender, non-distended, bowel sounds present, no hepatomegaly, no splenomegaly GU: external vaginal tissue ***, cervix ***, *** punctate lesions on cervix appreciated, *** discharge from cervical os, *** bleeding, *** cervical motion tenderness, *** abdominal/ adnexal masses Extremities: warm, well perfused, No edema, cyanosis or clubbing;  MSK: Normal gait and station Skin: dry, intact, no rashes or lesions Neuro: Strength and sensation grossly intact   Assessment/Plan: See problem based a/p  No problem-specific Assessment & Plan notes found for this encounter.

## 2017-01-14 NOTE — Patient Instructions (Addendum)
I want you to stop the jardiance  for now and follow up with me in 1 week. I want you be careful of  Coke as it has lot of caffeine in it .

## 2017-01-15 ENCOUNTER — Other Ambulatory Visit: Payer: Self-pay | Admitting: *Deleted

## 2017-01-15 DIAGNOSIS — E1342 Other specified diabetes mellitus with diabetic polyneuropathy: Secondary | ICD-10-CM

## 2017-01-15 NOTE — Telephone Encounter (Signed)
Refill request for 90 day supply.  Pepper Wyndham L, RN  

## 2017-01-16 MED ORDER — NAPROXEN 500 MG PO TABS: ORAL_TABLET | ORAL | 0 refills | Status: DC

## 2017-01-16 MED ORDER — GABAPENTIN 100 MG PO CAPS: 100.0000 mg | ORAL_CAPSULE | Freq: Three times a day (TID) | ORAL | 0 refills | Status: DC

## 2017-01-18 DIAGNOSIS — R350 Frequency of micturition: Secondary | ICD-10-CM | POA: Insufficient documentation

## 2017-01-18 NOTE — Assessment & Plan Note (Signed)
UA negative for signs of infection, POCT glucose 135 - no concerns about worsening diabetes per hx. Concern that because Jardiance is a diuretic patient is just having significant side effects from this medication  - Stop Jardiance - Follow up in 1 week  - POCT urinalysis dipstick - Glucose (CBG)

## 2017-01-22 ENCOUNTER — Ambulatory Visit (INDEPENDENT_AMBULATORY_CARE_PROVIDER_SITE_OTHER): Payer: BLUE CROSS/BLUE SHIELD | Admitting: Internal Medicine

## 2017-01-22 ENCOUNTER — Encounter: Payer: Self-pay | Admitting: Internal Medicine

## 2017-01-22 DIAGNOSIS — R350 Frequency of micturition: Secondary | ICD-10-CM

## 2017-01-22 MED ORDER — SENNOSIDES-DOCUSATE SODIUM 8.6-50 MG PO TABS: 1.0000 | ORAL_TABLET | Freq: Every day | ORAL | 1 refills | Status: DC

## 2017-01-22 NOTE — Progress Notes (Signed)
   [facility] Brandi CharsAsiyah Nida Manfredi, MD Phone: [phone]  Reason For Visit: Diabetes and f/u for polyuria   CHRONIC DM, Type 2: Januvia at 100 mg  CBGs: AM blood sugars have been 110-130  Reports  good compliance. Tolerating well w/o side-effects Any hypoglycemia episodes: Denies palpations, diaphoresis, fatigue, weakness, jittery Denies polyuria, visual changes, numbness or tingling.  Polyuria  -Last visit patient was having significant polyuria, normal blood sugars, no signs of infection on UA -Thought to be due to Jardiance as this medication is a diuretic - Advised patient to take stop [place]  - patient states polyuria has improved significantly  - No further issues with urgency or frequency  Past Medical History Reviewed problem list.  Medications- reviewed and updated No additions to family history Social history- patient is a non-smoker  Objective: BP 122/70   Pulse 89   Temp 98.3 F (36.8 C) (Oral)   Ht 5\' 7"  (1.702 m)   Wt 230 lb 9.6 oz (104.6 kg)   SpO2 99%   BMI 36.12 kg/m  Gen: NAD, alert, cooperative with exam Cardio: regular rate and rhythm, S1S2 heard, no murmurs appreciated Pulm: clear to auscultation bilaterally, no wheezes, rhonchi or rales GI: soft, non-tender, non-distended, bowel sounds present, no hepatomegaly, no splenomegaly MSK: Normal gait and station Skin: dry, intact, no rashes or lesions  Assessment/Plan: See problem based a/p  Urinary frequency Improved since stopping Jardiance  No further follow-up required Likely the diuretic effect of Jardiance caused significant discomfort    DM type 2 (diabetes mellitus, type 2) Last A1C 6.7  - Stopped Jardiance due to diuretic effect  -Continue Januvia, blood sugar avg 120s  - Will check A1C in one month  - Needs to follow up with opthamology - plans to make an appointment - Follow up in  1-2 months

## 2017-01-22 NOTE — Patient Instructions (Addendum)
Please get your eye exam as discussed. Follow up with me in October or as needed

## 2017-01-23 NOTE — Assessment & Plan Note (Addendum)
Last A1C 6.7  - Stopped Jardiance due to diuretic effect  -Continue Januvia, blood sugar avg 120s  - Will check A1C in one month  - Needs to follow up with opthamology - plans to make an appointment - Follow up in  1-2 months

## 2017-01-23 NOTE — Assessment & Plan Note (Signed)
Improved since stopping Jardiance  No further follow-up required Likely the diuretic effect of Jardiance caused significant discomfort

## 2017-02-06 ENCOUNTER — Other Ambulatory Visit: Payer: Self-pay | Admitting: Family Medicine

## 2017-02-06 DIAGNOSIS — E118 Type 2 diabetes mellitus with unspecified complications: Secondary | ICD-10-CM

## 2017-02-12 ENCOUNTER — Other Ambulatory Visit: Payer: Self-pay | Admitting: Family Medicine

## 2017-02-12 DIAGNOSIS — E118 Type 2 diabetes mellitus with unspecified complications: Secondary | ICD-10-CM

## 2017-02-19 ENCOUNTER — Other Ambulatory Visit: Payer: Self-pay | Admitting: *Deleted

## 2017-02-19 DIAGNOSIS — E1342 Other specified diabetes mellitus with diabetic polyneuropathy: Secondary | ICD-10-CM

## 2017-02-19 MED ORDER — GABAPENTIN 100 MG PO CAPS: 100.0000 mg | ORAL_CAPSULE | Freq: Three times a day (TID) | ORAL | 0 refills | Status: DC

## 2017-02-19 MED ORDER — NAPROXEN 500 MG PO TABS: ORAL_TABLET | ORAL | 0 refills | Status: DC

## 2017-03-12 ENCOUNTER — Other Ambulatory Visit: Payer: Self-pay | Admitting: *Deleted

## 2017-03-12 DIAGNOSIS — E118 Type 2 diabetes mellitus with unspecified complications: Secondary | ICD-10-CM

## 2017-03-12 MED ORDER — EMPAGLIFLOZIN 10 MG PO TABS: 10.0000 mg | ORAL_TABLET | Freq: Every day | ORAL | 0 refills | Status: DC

## 2017-04-05 ENCOUNTER — Other Ambulatory Visit: Payer: Self-pay | Admitting: *Deleted

## 2017-04-05 DIAGNOSIS — E118 Type 2 diabetes mellitus with unspecified complications: Secondary | ICD-10-CM

## 2017-04-05 MED ORDER — EMPAGLIFLOZIN 10 MG PO TABS: 10.0000 mg | ORAL_TABLET | Freq: Every day | ORAL | 0 refills | Status: DC

## 2017-04-05 MED ORDER — SITAGLIPTIN PHOSPHATE 100 MG PO TABS: 100.0000 mg | ORAL_TABLET | Freq: Every day | ORAL | 3 refills | Status: DC

## 2017-04-05 MED ORDER — LISINOPRIL 40 MG PO TABS: 40.0000 mg | ORAL_TABLET | Freq: Every day | ORAL | 3 refills | Status: DC

## 2017-04-16 ENCOUNTER — Other Ambulatory Visit: Payer: Self-pay | Admitting: Internal Medicine

## 2017-04-16 DIAGNOSIS — E1342 Other specified diabetes mellitus with diabetic polyneuropathy: Secondary | ICD-10-CM

## 2017-04-16 MED ORDER — GABAPENTIN 100 MG PO CAPS: 100.0000 mg | ORAL_CAPSULE | Freq: Three times a day (TID) | ORAL | 2 refills | Status: DC

## 2017-04-24 ENCOUNTER — Other Ambulatory Visit: Payer: Self-pay

## 2017-04-24 ENCOUNTER — Encounter: Payer: Self-pay | Admitting: Family Medicine

## 2017-04-24 ENCOUNTER — Ambulatory Visit: Payer: BLUE CROSS/BLUE SHIELD | Admitting: Family Medicine

## 2017-04-24 VITALS — BP 142/92 | HR 63 | Temp 98.3°F | Ht 67.0 in | Wt 231.0 lb

## 2017-04-24 DIAGNOSIS — E118 Type 2 diabetes mellitus with unspecified complications: Secondary | ICD-10-CM

## 2017-04-24 DIAGNOSIS — I10 Essential (primary) hypertension: Secondary | ICD-10-CM

## 2017-04-24 DIAGNOSIS — Z23 Encounter for immunization: Secondary | ICD-10-CM | POA: Diagnosis not present

## 2017-04-24 LAB — POCT GLYCOSYLATED HEMOGLOBIN (HGB A1C): Hemoglobin A1C: 6.4

## 2017-04-24 MED ORDER — HYDROCHLOROTHIAZIDE 25 MG PO TABS: 25.0000 mg | ORAL_TABLET | Freq: Every day | ORAL | 3 refills | Status: DC

## 2017-04-24 NOTE — Patient Instructions (Addendum)
It was great seeing you today! We have addressed the following issues today  1. Your A1c is 6.4 improved from last time 6.7. Keep up the good work. 2. I will start you on HCTZ 25 mg an increase from your previous dose of 12.5 mg. I want you to come back on Friday  for a BP check with the nurse. Continue to take your lisinopril.  If we did any lab work today, and the results require attention, either me or my nurse will get in touch with you. If everything is normal, you will get a letter in mail and a message via . If you don't hear from us in two weeks, please give us a call. Otherwise, we look forward to seeing you again at your next visit. If you have any questions or concerns before then, please call the clinic at [phone].  Please bring all your medications to every doctors visit  Sign up for My Chart to have easy access to your labs results, and communication with your Primary care physician. Please ask Front Desk for some assistance.   Please check-out at the front desk before leaving the clinic.    Take Care,   Dr. Sydnee Cabaliallo

## 2017-04-25 NOTE — Progress Notes (Signed)
   Subjective:    Patient ID: Brandi Davenport, female    DOB: 04-29-1956, 61 y.o.   MRN: [id number]   CC: Concerns about elevated blood pressure  HPI: Patient is a 61 year old female who presents today with concerns for elevated blood pressure.  Patient reports that for the past 2-3 days she has been having intractable headaches, some dizziness and blurry vision. Patient reports that blood pressure was taken yesterday (11/13) at work and read 179/90.  Today she had a repeat blood pressure at work which read 172/92.  Given his headaches, vision changes and dizziness, patient decided to make an appointment to be seen today in office.  Patient is currently on HCTZ 12.5 mg daily and Lisinopril 40 mg daily.  Patient has been adherent to her medications.  Patient denies any focal neurologic symptoms except for headaches.  Patient denies any chest pain, shortness of breath, abdominal pain, tinnitus, nausea, vomiting.  Smoking status reviewed   ROS: all other systems were reviewed and are negative other than in the HPI   Past Medical History:  Diagnosis Date  . Diabetes mellitus without complication (HCC)   . Hyperlipidemia   . Hypertension     Past Surgical History:  Procedure Laterality Date  . ABDOMINAL HYSTERECTOMY    . tooth removal    . TUBAL LIGATION      Past medical history, surgical, family, and social history reviewed and updated in the EMR as appropriate.  Objective:  BP (!) 142/92   Pulse 63   Temp 98.3 F (36.8 C) (Oral)   Ht 5\' 7"  (1.702 m)   Wt 104.8 kg (231 lb)   SpO2 98%   BMI 36.18 kg/m   Vitals and nursing note reviewed  General: NAD, pleasant, able to participate in exam Cardiac: RRR, normal heart sounds, no murmurs. 2+ radial and PT pulses bilaterally Respiratory: CTAB, normal effort, No wheezes, rales or rhonchi Abdomen: soft, nontender, nondistended, no hepatic or splenomegaly, +BS Extremities: no edema or cyanosis. WWP. Skin: warm and dry, no rashes  noted Neuro: alert and oriented x4, no focal deficits Psych: Normal affect and mood   Assessment & Plan:   #Elevated blood pressure, acute Patient is present with intractable headache dizziness for the past 3 days with 2 out of office blood pressure measurements with systolic in the 170s and diastolic in the 90s.  Patient reports compliance with blood pressure medication (lisinopril 40 mg and HCTZ 12.5 mg).  Today in office blood pressure was 142/92 with a repeat of 137/92.  Symptoms consistent with hypertensive crisis however blood pressure readings in office not concerning enough for inpatient admission.  Will increase blood pressure medication and schedule a nurse visit on Friday for repeat measurement. --Increase HCTZ from 12.5 mg daily to 25 mg --Continue lisinopril 40 mg --Repeat blood pressure check on Friday 11/16 --Follow-up with PCP   Lovena NeighboursAbdoulaye Martavia Tye, MD [facility] PGY-2

## 2017-04-26 ENCOUNTER — Ambulatory Visit (INDEPENDENT_AMBULATORY_CARE_PROVIDER_SITE_OTHER): Payer: BLUE CROSS/BLUE SHIELD | Admitting: *Deleted

## 2017-04-26 VITALS — BP 150/96 | HR 102

## 2017-04-26 DIAGNOSIS — Z0130 Encounter for examination of blood pressure without abnormal findings: Secondary | ICD-10-CM

## 2017-04-26 DIAGNOSIS — I10 Essential (primary) hypertension: Secondary | ICD-10-CM

## 2017-04-26 NOTE — Progress Notes (Signed)
   Patient presents for BP check after provider increased HCTZ to 25 mg, however, patient was confused on directions and has still been taking 12.5 mg. Last dose at 7 AM. Also stopped lisinopril. Last dose 3 days ago.  Vitals:   04/26/17 1513  BP: (!) 162/98  Pulse: (!) 102  SpO2: 97%   BP recheck 150/96 left arm manually with adult cuff  Discussed need for low sodium diet and using Mrs. Dash as alternative to salt Encouraged to choose foods with 5% or less of daily value for sodium. Patient given literature on DASH Eating Plan Patient walks all day at job Patient positive for headaches and blurred vision X 3 days; [facility], chest pain   F/u made for 05/10/17 at 1:50 with PCP  Patient advised to call for med refills at least 7 days before running out so as not to go without.  Reviewed meds on AVS together. Wrote what each med is for and how many times per day med should be taken. Patient very appreciative. Kinnie FeilL. Sherley Leser, RN, BSN

## 2017-04-26 NOTE — Patient Instructions (Signed)
DASH Eating Plan DASH stands for "Dietary Approaches to Stop Hypertension." The DASH eating plan is a healthy eating plan that has been shown to reduce high blood pressure (hypertension). It may also reduce your risk for type 2 diabetes, heart disease, and stroke. The DASH eating plan may also help with weight loss. What are tips for following this plan? General guidelines  Avoid eating more than 2,300 mg (milligrams) of salt (sodium) a day. If you have hypertension, you may need to reduce your sodium intake to 1,500 mg a day.  Limit alcohol intake to no more than 1 drink a day for nonpregnant women and 2 drinks a day for men. One drink equals 12 oz of beer, 5 oz of wine, or 1 oz of hard liquor.  Work with your health care provider to maintain a healthy body weight or to lose weight. Ask what an ideal weight is for you.  Get at least 30 minutes of exercise that causes your heart to beat faster (aerobic exercise) most days of the week. Activities may include walking, swimming, or biking.  Work with your health care provider or diet and nutrition specialist (dietitian) to adjust your eating plan to your individual calorie needs. Reading food labels  Check food labels for the amount of sodium per serving. Choose foods with less than 5 percent of the Daily Value of sodium. Generally, foods with less than 300 mg of sodium per serving fit into this eating plan.  To find whole grains, look for the word "whole" as the first word in the ingredient list. Shopping  Buy products labeled as "low-sodium" or "no salt added."  Buy fresh foods. Avoid canned foods and premade or frozen meals. Cooking  Avoid adding salt when cooking. Use salt-free seasonings or herbs instead of table salt or sea salt. Check with your health care provider or pharmacist before using salt substitutes.  Do not fry foods. Cook foods using healthy methods such as baking, boiling, grilling, and broiling instead.  Cook with  heart-healthy oils, such as olive, canola, soybean, or sunflower oil. Meal planning   Eat a balanced diet that includes: ? 5 or more servings of fruits and vegetables each day. At each meal, try to fill half of your plate with fruits and vegetables. ? Up to 6-8 servings of whole grains each day. ? Less than 6 oz of lean meat, poultry, or fish each day. A 3-oz serving of meat is about the same size as a deck of cards. One egg equals 1 oz. ? 2 servings of low-fat dairy each day. ? A serving of nuts, seeds, or beans 5 times each week. ? Heart-healthy fats. Healthy fats called Omega-3 fatty acids are found in foods such as flaxseeds and coldwater fish, like sardines, salmon, and mackerel.  Limit how much you eat of the following: ? Canned or prepackaged foods. ? Food that is high in trans fat, such as fried foods. ? Food that is high in saturated fat, such as fatty meat. ? Sweets, desserts, sugary drinks, and other foods with added sugar. ? Full-fat dairy products.  Do not salt foods before eating.  Try to eat at least 2 vegetarian meals each week.  Eat more home-cooked food and less restaurant, buffet, and fast food.  When eating at a restaurant, ask that your food be prepared with less salt or no salt, if possible. What foods are recommended? The items listed may not be a complete list. Talk with your dietitian about what   dietary choices are best for you. Grains Whole-grain or whole-wheat bread. Whole-grain or whole-wheat pasta. Brown rice. Oatmeal. Quinoa. Bulgur. Whole-grain and low-sodium cereals. Pita bread. Low-fat, low-sodium crackers. Whole-wheat flour tortillas. Vegetables Fresh or frozen vegetables (raw, steamed, roasted, or grilled). Low-sodium or reduced-sodium tomato and vegetable juice. Low-sodium or reduced-sodium tomato sauce and tomato paste. Low-sodium or reduced-sodium canned vegetables. Fruits All fresh, dried, or frozen fruit. Canned fruit in natural juice (without  added sugar). Meat and other protein foods Skinless chicken or turkey. Ground chicken or turkey. Pork with fat trimmed off. Fish and seafood. Egg whites. Dried beans, peas, or lentils. Unsalted nuts, nut butters, and seeds. Unsalted canned beans. Lean cuts of beef with fat trimmed off. Low-sodium, lean deli meat. Dairy Low-fat (1%) or fat-free (skim) milk. Fat-free, low-fat, or reduced-fat cheeses. Nonfat, low-sodium ricotta or cottage cheese. Low-fat or nonfat yogurt. Low-fat, low-sodium cheese. Fats and oils Soft margarine without trans fats. Vegetable oil. Low-fat, reduced-fat, or light mayonnaise and salad dressings (reduced-sodium). Canola, safflower, olive, soybean, and sunflower oils. Avocado. Seasoning and other foods Herbs. Spices. Seasoning mixes without salt. Unsalted popcorn and pretzels. Fat-free sweets. What foods are not recommended? The items listed may not be a complete list. Talk with your dietitian about what dietary choices are best for you. Grains Baked goods made with fat, such as croissants, muffins, or some breads. Dry pasta or rice meal packs. Vegetables Creamed or fried vegetables. Vegetables in a cheese sauce. Regular canned vegetables (not low-sodium or reduced-sodium). Regular canned tomato sauce and paste (not low-sodium or reduced-sodium). Regular tomato and vegetable juice (not low-sodium or reduced-sodium). Pickles. Olives. Fruits Canned fruit in a light or heavy syrup. Fried fruit. Fruit in cream or butter sauce. Meat and other protein foods Fatty cuts of meat. Ribs. Fried meat. Bacon. Sausage. Bologna and other processed lunch meats. Salami. Fatback. Hotdogs. Bratwurst. Salted nuts and seeds. Canned beans with added salt. Canned or smoked fish. Whole eggs or egg yolks. Chicken or turkey with skin. Dairy Whole or 2% milk, cream, and half-and-half. Whole or full-fat cream cheese. Whole-fat or sweetened yogurt. Full-fat cheese. Nondairy creamers. Whipped toppings.  Processed cheese and cheese spreads. Fats and oils Butter. Stick margarine. Lard. Shortening. Ghee. Bacon fat. Tropical oils, such as coconut, palm kernel, or palm oil. Seasoning and other foods Salted popcorn and pretzels. Onion salt, garlic salt, seasoned salt, table salt, and sea salt. Worcestershire sauce. Tartar sauce. Barbecue sauce. Teriyaki sauce. Soy sauce, including reduced-sodium. Steak sauce. Canned and packaged gravies. Fish sauce. Oyster sauce. Cocktail sauce. Horseradish that you find on the shelf. Ketchup. Mustard. Meat flavorings and tenderizers. Bouillon cubes. Hot sauce and Tabasco sauce. Premade or packaged marinades. Premade or packaged taco seasonings. Relishes. Regular salad dressings. Where to find more information:  National Heart, Lung, and Blood Institute: www.nhlbi.nih.gov  American Heart Association: www.heart.org Summary  The DASH eating plan is a healthy eating plan that has been shown to reduce high blood pressure (hypertension). It may also reduce your risk for type 2 diabetes, heart disease, and stroke.  With the DASH eating plan, you should limit salt (sodium) intake to 2,300 mg a day. If you have hypertension, you may need to reduce your sodium intake to 1,500 mg a day.  When on the DASH eating plan, aim to eat more fresh fruits and vegetables, whole grains, lean proteins, low-fat dairy, and heart-healthy fats.  Work with your health care provider or diet and nutrition specialist (dietitian) to adjust your eating plan to your individual   calorie needs. This information is not intended to replace advice given to you by your health care provider. Make sure you discuss any questions you have with your health care provider. Document Released: 05/17/2011 Document Revised: 05/21/2016 Document Reviewed: 05/21/2016 Elsevier Interactive Patient Education  2017 Elsevier Inc.  

## 2017-05-08 ENCOUNTER — Other Ambulatory Visit: Payer: Self-pay | Admitting: Internal Medicine

## 2017-05-08 DIAGNOSIS — E1342 Other specified diabetes mellitus with diabetic polyneuropathy: Secondary | ICD-10-CM

## 2017-05-08 MED ORDER — GABAPENTIN 100 MG PO CAPS: 100.0000 mg | ORAL_CAPSULE | Freq: Three times a day (TID) | ORAL | 3 refills | Status: DC

## 2017-05-10 ENCOUNTER — Other Ambulatory Visit: Payer: Self-pay

## 2017-05-10 ENCOUNTER — Ambulatory Visit (HOSPITAL_COMMUNITY)
Admission: RE | Admit: 2017-05-10 | Discharge: 2017-05-10 | Disposition: A | Discharge status: Home / Self Care | Payer: BLUE CROSS/BLUE SHIELD | Source: Ambulatory Visit | Attending: Family Medicine | Admitting: Family Medicine

## 2017-05-10 ENCOUNTER — Encounter: Payer: Self-pay | Admitting: Internal Medicine

## 2017-05-10 ENCOUNTER — Ambulatory Visit: Payer: BLUE CROSS/BLUE SHIELD | Admitting: Internal Medicine

## 2017-05-10 VITALS — BP 104/66 | HR 93 | Temp 98.6°F | Ht 67.0 in | Wt 232.2 lb

## 2017-05-10 DIAGNOSIS — R0602 Shortness of breath: Secondary | ICD-10-CM

## 2017-05-10 DIAGNOSIS — I10 Essential (primary) hypertension: Secondary | ICD-10-CM

## 2017-05-10 NOTE — Progress Notes (Signed)
   [facility] Noralee CharsAsiyah Jeananne Bedwell, MD Phone: [phone]  Reason For Visit: Follow up  # CHRONIC HTN: Current Meds - HCTZ 25 mg and Linisopril  Reports good compliance,took meds today. Tolerating well, w/o complaints. Lifestyle -Keeping away  Endorses CP, dyspnea, HA, edema, dizziness / lightheadedness  #SOB/Palpations  - Patient has been real tired  - Patient states she has palpitations and dizziness happens when patient is really busy  - She notes that her heart flutters, feels dizzy, and feels weak in the legs - Nothing in particular makes it worse or better  - Notes some right sided chest pain - does not move anywhere  - Feel more SOB recently, has significantly more difficulty than to use it climbing steps and no significant leg swelling  - No smoking or drinking   Past Medical History Reviewed problem list.  Medications- reviewed and updated No additions to family history Social history- patient is a non-smoker  Objective: BP 104/66   Pulse 93   Temp 98.6 F (37 C) (Oral)   Ht 5\' 7"  (1.702 m)   Wt 232 lb 3.2 oz (105.3 kg)   SpO2 98%   BMI 36.37 kg/m  Gen: NAD, alert, cooperative with exam Cardio: regular rate and rhythm, S1S2 heard, no murmurs appreciated Pulm: clear to auscultation bilaterally, no wheezes, rhonchi or rales GI: soft, non-tender, non-distended, bowel sounds present, no hepatomegaly, no splenomegaly Extremities: warm, well perfused, No edema, cyanosis or clubbing;  Skin: dry, intact, no rashes or lesions  EKG without any significant findings-no ST elevation or depression, no arrhythmias  Assessment/Plan: See problem based a/p  HYPERTENSION, BENIGN SYSTEMIC Blood pressure slightly low today, [facility]  hydrochlorothiazide to 25 mg -Patient with orthostatics today and therefore hydrochlorothiazide decreased again to 12.5 mg from 25 -Currently on 40 mg of  lisinopril   SOB (shortness of breath) on exertion Complaint of shortness of breath and palpitations.  Given  history of worsening shortness of breath and patient with several cardiac risk factors will have her follow-up with cardiology.  EKG without any arrhythmias or significant changes.  Will also obtain blood work check on electrolytes and hemoglobin.  Orthostatics positive today decrease blood pressure medication. - Ambulatory referral to Cardiology - ECHOCARDIOGRAM COMPLETE; Future - CBC - Basic Metabolic Panel - EKG 12-Lead -Follow-up in 2 weeks

## 2017-05-10 NOTE — Patient Instructions (Addendum)
It was nice seeing you today.  We will workup your palpitations and shortness of breath with an EKG, will also check to make sure you are not having significant drops in your blood pressure, I will send in a referral for cardiology and place an order for an echocardiogram which looks at your heart function.  Given you orthostatic in 1-2 decrease her hydrochlorothiazide back down to 12.5 mg or 1 pill. follow-up with me in about 2 weeks

## 2017-05-11 LAB — CBC
HEMATOCRIT: 35 % (ref 34.0–46.6)
HEMOGLOBIN: 11.4 g/dL (ref 11.1–15.9)
MCH: 25.8 pg — ABNORMAL LOW (ref 26.6–33.0)
MCHC: 32.6 g/dL (ref 31.5–35.7)
MCV: 79 fL (ref 79–97)
Platelets: 302 10*3/uL (ref 150–379)
RBC: 4.42 x10E6/uL (ref 3.77–5.28)
RDW: 14.9 % (ref 12.3–15.4)
WBC: 7.2 10*3/uL (ref 3.4–10.8)

## 2017-05-11 LAB — BASIC METABOLIC PANEL
BUN/Creatinine Ratio: 22 (ref 9–23)
BUN: 19 mg/dL (ref 6–24)
CALCIUM: 9.6 mg/dL (ref 8.7–10.2)
CO2: 28 mmol/L (ref 20–29)
CREATININE: 0.87 mg/dL (ref 0.57–1.00)
Chloride: 99 mmol/L (ref 96–106)
GFR calc Af Amer: 84 mL/min/{1.73_m2} (ref >59)
GFR, EST NON AFRICAN AMERICAN: 73 mL/min/{1.73_m2} (ref >59)
Glucose: 152 mg/dL — ABNORMAL HIGH (ref 65–99)
POTASSIUM: 4.5 mmol/L (ref 3.5–5.2)
Sodium: 138 mmol/L (ref 134–144)

## 2017-05-13 ENCOUNTER — Ambulatory Visit (HOSPITAL_COMMUNITY)
Admission: RE | Admit: 2017-05-13 | Discharge: 2017-05-13 | Disposition: A | Discharge status: Home / Self Care | Payer: BLUE CROSS/BLUE SHIELD | Source: Ambulatory Visit | Attending: Family Medicine | Admitting: Family Medicine

## 2017-05-13 DIAGNOSIS — R030 Elevated blood-pressure reading, without diagnosis of hypertension: Secondary | ICD-10-CM | POA: Insufficient documentation

## 2017-05-13 DIAGNOSIS — R51 Headache: Secondary | ICD-10-CM | POA: Insufficient documentation

## 2017-05-13 DIAGNOSIS — R0602 Shortness of breath: Secondary | ICD-10-CM | POA: Insufficient documentation

## 2017-05-13 DIAGNOSIS — R42 Dizziness and giddiness: Secondary | ICD-10-CM | POA: Insufficient documentation

## 2017-05-13 DIAGNOSIS — I071 Rheumatic tricuspid insufficiency: Secondary | ICD-10-CM | POA: Diagnosis not present

## 2017-05-13 DIAGNOSIS — H538 Other visual disturbances: Secondary | ICD-10-CM | POA: Diagnosis not present

## 2017-05-13 NOTE — Assessment & Plan Note (Signed)
Complaint of shortness of breath and palpitations.  Given  history of worsening shortness of breath and patient with several cardiac risk factors will have her follow-up with cardiology.  EKG without any arrhythmias or significant changes.  Will also obtain blood work check on electrolytes and hemoglobin.  Orthostatics positive today decrease blood pressure medication. - Ambulatory referral to Cardiology - ECHOCARDIOGRAM COMPLETE; Future - CBC - Basic Metabolic Panel - EKG 12-Lead -Follow-up in 2 weeks

## 2017-05-13 NOTE — Assessment & Plan Note (Addendum)
Blood pressure slightly low today, [facility]  hydrochlorothiazide to 25 mg -Patient with orthostatics today and therefore hydrochlorothiazide decreased again to 12.5 mg from 25 -Currently on 40 mg of lisinopril

## 2017-05-13 NOTE — Progress Notes (Signed)
  Echocardiogram 2D Echocardiogram has been performed.  Roosvelt MaserLane, Xabi Wittler F 05/13/2017, 8:33 AM

## 2017-05-30 ENCOUNTER — Ambulatory Visit: Payer: BLUE CROSS/BLUE SHIELD | Admitting: Internal Medicine

## 2017-05-30 VITALS — BP 122/80 | HR 81 | Temp 98.3°F | Ht 67.0 in | Wt 230.8 lb

## 2017-05-30 DIAGNOSIS — R0602 Shortness of breath: Secondary | ICD-10-CM | POA: Diagnosis not present

## 2017-05-30 DIAGNOSIS — I10 Essential (primary) hypertension: Secondary | ICD-10-CM | POA: Diagnosis not present

## 2017-05-30 DIAGNOSIS — E118 Type 2 diabetes mellitus with unspecified complications: Secondary | ICD-10-CM | POA: Diagnosis not present

## 2017-05-30 NOTE — Progress Notes (Signed)
   [facility] Brandi CharsAsiyah Ulyses Panico, MD Phone: [phone]  Reason For Visit: Follow-up   #SOB -States SOB has resolved since last visit -Patient still has palpations and dizziness but only when going from one position to another -No chest pain -Overall patient states she is feeling significantly better -Discuss results of her echocardiogram with her -Denies any significant lower extremity swelling  CHRONIC DM, Type 2: CBGs: Patient is concerned as she sometimes has blood sugars 150 or greater over the past 2 weeks.  She has had blood sugars as high as 200.  She denies eating any significant only large meals recently especially before bed.  She is worried about her blood sugars and would like to bring them down Meds: Januvia 100 mg Reports  good compliance. Tolerating well w/o side-effects Currently on ACEi / ARB  Lifestyle: Discussed with patient she can have Splenda based ice cream, indicated that she should try to stay away from sodas specifically and try sparkling water Any hypoglycemia episodes: Denies palpations, diaphoresis, fatigue, weakness, jittery Denies polyuria, visual changes, numbness or tingling.  Past Medical History Reviewed problem list.  Medications- reviewed and updated No additions to family history Social history- patient is a non smoker  Objective: BP 122/80 (BP Location: Right Arm, Patient Position: Sitting, Cuff Size: Normal)   Pulse 81   Temp 98.3 F (36.8 C) (Oral)   Ht 5\' 7"  (1.702 m)   Wt 230 lb 12.8 oz (104.7 kg)   SpO2 99%   BMI 36.15 kg/m  Gen: NAD, alert, cooperative with exam Cardio: regular rate and rhythm, S1S2 heard, no murmurs appreciated Pulm: clear to auscultation bilaterally, no wheezes, rhonchi or rales GI: soft, non-tender, non-distended, bowel sounds present, no hepatomegaly, no splenomegaly Extremities: warm, well perfused, No edema, cyanosis or clubbing;    Assessment/Plan: See problem based a/p  SOB  (shortness of breath) on exertion Echocardiogram within normal limits, EKG within normal limits.  Symptoms of shortness of breath have self resolved.  Patient is euvolemic on examination.  Lab work including CBC and bmet within normal limits -Patient does endorse feeling dizzy and having palpitations -when going from laying to standing or sitting to standing with positive orthostatics at last visit -Reduced blood pressure medication patient has been doing well on this  DM type 2 (diabetes mellitus, type 2) CBGs between 115 200 over the last 2 weeks, previously 120 in the a.m. Will continue Januvia at 100 mg A1c in January Discussed with patient that she can have Splenda ice cream however would continue to exercise and try to maintain her weight and not gain weight  HYPERTENSION, BENIGN SYSTEMIC Decreased blood pressure medication from HCTZ 25 to 12.5 mg due to orthostatic hypotension Continue lisinopril 40 mg  follow-up in January

## 2017-05-30 NOTE — Patient Instructions (Signed)
It was nice to see you today.  You can enjoy your breyers Splenda ice cream, I give you permission.  All up with me in January and will recheck your A1c to make sure it looks good.. Try to increase your sparkling water and decrease your diet drinks.  You need to make an appointment to get your eyes examined please try to do this as soon as you can.  I am happy to hear your shortness of breath is improved.

## 2017-06-04 MED ORDER — HYDROCHLOROTHIAZIDE 25 MG PO TABS: 12.5000 mg | ORAL_TABLET | Freq: Every day | ORAL | 3 refills | Status: DC

## 2017-06-04 NOTE — Assessment & Plan Note (Signed)
Decreased blood pressure medication from HCTZ 25 to 12.5 mg due to orthostatic hypotension Continue lisinopril 40 mg  follow-up in January

## 2017-06-04 NOTE — Assessment & Plan Note (Signed)
Echocardiogram within normal limits, EKG within normal limits.  Symptoms of shortness of breath have self resolved.  Patient is euvolemic on examination.  Lab work including CBC and bmet within normal limits -Patient does endorse feeling dizzy and having palpitations -when going from laying to standing or sitting to standing with positive orthostatics at last visit -Reduced blood pressure medication patient has been doing well on this

## 2017-06-04 NOTE — Assessment & Plan Note (Signed)
CBGs between 115 200 over the last 2 weeks, previously 120 in the a.m. Will continue Januvia at 100 mg A1c in January Discussed with patient that she can have Splenda ice cream however would continue to exercise and try to maintain her weight and not gain weight

## 2017-06-26 ENCOUNTER — Ambulatory Visit: Payer: BLUE CROSS/BLUE SHIELD | Admitting: Internal Medicine

## 2017-06-26 VITALS — BP 120/80 | HR 87 | Temp 98.3°F | Ht 67.0 in | Wt 232.6 lb

## 2017-06-26 DIAGNOSIS — E118 Type 2 diabetes mellitus with unspecified complications: Secondary | ICD-10-CM | POA: Diagnosis not present

## 2017-06-26 DIAGNOSIS — G5601 Carpal tunnel syndrome, right upper limb: Secondary | ICD-10-CM | POA: Diagnosis not present

## 2017-06-26 LAB — POCT GLYCOSYLATED HEMOGLOBIN (HGB A1C): HEMOGLOBIN A1C: 7.2

## 2017-06-26 MED ORDER — NAPROXEN 500 MG PO TABS: 500.0000 mg | ORAL_TABLET | Freq: Two times a day (BID) | ORAL | 0 refills | Status: DC

## 2017-06-26 NOTE — Progress Notes (Signed)
a1c

## 2017-06-26 NOTE — Progress Notes (Signed)
   [facility] Noralee CharsAsiyah Prathik Aman, MD Phone: [phone]  Reason For Visit:   # CHRONIC DM, Type 2: Reports  no concerns Meds: Januvia  Reports good compliance. Tolerating well w/o side-effects Lifestyle: Discussed the importance of diet with patient and at this time Denies polyuria, visual changes, numbness or tingling.   #Hand tingling  Reports tingling in her right fingertips.  She states when she is active cleaning happens in all those fingertips.  Sometimes she wakes up in the morning and her hand is cramped up and it takes a moment for her to loosen it up.  She also indicates having pain in her hand.  This is been going on for several weeks.  She denies any radiation of pain down her arm.  She denies any pain in her shoulder or neck.  No history of trauma  Past Medical History Reviewed problem list.  Medications- reviewed and updated No additions to family history Social history- patient is a non-smoker  Objective: BP 120/80   Pulse 87   Temp 98.3 F (36.8 C)   Ht 5\' 7"  (1.702 m)   Wt 232 lb 9.6 oz (105.5 kg)   SpO2 99%   BMI 36.43 kg/m  Gen: NAD, alert, cooperative with exam Extremities no abnormalities noted on inspection, pain with palpation at the median nerve, along the length of the wrist, pain with Phineal sign though no reproduction of numbness, negative tinel test,  Skin: dry, intact, no rashes or lesions  Diabetic Foot Exam - Simple   Simple Foot Form Diabetic Foot exam was performed with the following findings:  Yes 06/26/2017  3:00 PM  Visual Inspection No deformities, no ulcerations, no other skin breakdown bilaterally:  Yes Sensation Testing Intact to touch and monofilament testing bilaterally:  Yes Pulse Check Posterior Tibialis and Dorsalis pulse intact bilaterally:  Yes Comments    Assessment/Plan: See problem based a/p  DM type 2 (diabetes mellitus, type 2) A1C 7.2  Please continue Januvia Discussed the importance of diet  and decreasing her weight Provided a her with a referral to nutrition  Foot exam obtained   Carpal tunnel syndrome of right wrist Hand tingling and pain.  Either carpal tunnel versus osteoarthritis of the hand.  No swelling therefore not concerning for any gouty episode or other abnormalities.  Patient without any history of trauma therefore unlikely a fracture -Will try a wrist splint -Continue naproxen as needed for hand pain -Follow-up in 1 month if no improvement

## 2017-06-26 NOTE — Patient Instructions (Addendum)
I would try using the wrist splint when you are active at work and as well at night to help with this ailment.  I will also prescribe you naproxen to take as needed  Carpal Tunnel Syndrome Carpal tunnel syndrome is a condition that causes pain in your hand and arm. The carpal tunnel is a narrow area that is on the palm side of your wrist. Repeated wrist motion or certain diseases may cause swelling in the tunnel. This swelling can pinch the main nerve in the wrist (median nerve). Follow these instructions at home: If you have a splint:  Wear it as told by your doctor. Remove it only as told by your doctor.  Loosen the splint if your fingers: ? Become numb and tingle. ? Turn blue and cold.  Keep the splint clean and dry. General instructions  Take over-the-counter and prescription medicines only as told by your doctor.  Rest your wrist from any activity that may be causing your pain. If needed, talk to your employer about changes that can be made in your work, such as getting a wrist pad to use while typing.  If directed, apply ice to the painful area: ? Put ice in a plastic bag. ? Place a towel between your skin and the bag. ? Leave the ice on for 20 minutes, 2-3 times per day.  Keep all follow-up visits as told by your doctor. This is important.  Do any exercises as told by your doctor, physical therapist, or occupational therapist. Contact a doctor if:  You have new symptoms.  Medicine does not help your pain.  Your symptoms get worse. This information is not intended to replace advice given to you by your health care provider. Make sure you discuss any questions you have with your health care provider. Document Released: 05/17/2011 Document Revised: 11/03/2015 Document Reviewed: 10/13/2014 Elsevier Interactive Patient Education  [employer]2018 [employer]

## 2017-06-27 ENCOUNTER — Encounter: Payer: Self-pay | Admitting: Internal Medicine

## 2017-06-27 DIAGNOSIS — G5601 Carpal tunnel syndrome, right upper limb: Secondary | ICD-10-CM | POA: Insufficient documentation

## 2017-06-27 NOTE — Assessment & Plan Note (Signed)
Hand tingling and pain.  Either carpal tunnel versus osteoarthritis of the hand.  No swelling therefore not concerning for any gouty episode or other abnormalities.  Patient without any history of trauma therefore unlikely a fracture -Will try a wrist splint -Continue naproxen as needed for hand pain -Follow-up in 1 month if no improvement

## 2017-06-27 NOTE — Assessment & Plan Note (Addendum)
A1C 7.2  Please continue Januvia Discussed the importance of diet and decreasing her weight Provided a her with a referral to nutrition  Foot exam obtained

## 2017-08-07 ENCOUNTER — Other Ambulatory Visit: Payer: Self-pay | Admitting: Internal Medicine

## 2017-08-07 MED ORDER — RANITIDINE HCL 150 MG PO CAPS: 150.0000 mg | ORAL_CAPSULE | Freq: Two times a day (BID) | ORAL | 0 refills | Status: DC

## 2017-08-07 MED ORDER — SITAGLIPTIN PHOSPHATE 100 MG PO TABS: 100.0000 mg | ORAL_TABLET | Freq: Every day | ORAL | 3 refills | Status: DC

## 2017-08-07 NOTE — Telephone Encounter (Signed)
Patient came to office request refill on Januvia. She is totally out. Patient stated she has left msg. & no return call. Patient also needs refill on Zantac.  [facility] [place] Best phone # if questions  [phone]

## 2017-09-10 ENCOUNTER — Telehealth: Payer: Self-pay | Admitting: Internal Medicine

## 2017-09-10 NOTE — Telephone Encounter (Signed)
Pt wants blue examine gloves to work in because the white powder gloves are causing her to break out. Her boss needs note from her Doctor giving permission to wear these blue gloves because of the skin irritation.

## 2017-09-11 NOTE — Telephone Encounter (Signed)
Please have patient make an appointment to discuss this allergic reaction. Thanks

## 2017-09-12 ENCOUNTER — Telehealth: Payer: Self-pay

## 2017-09-12 NOTE — Telephone Encounter (Signed)
Attempted to call patient to set up an appointment to discuss allergic reaction to gloves per Dr. Cathlean CowerMikell. There was no answer and a voice message was left for patient to call back and schedule an appointment.[name], [name], CMA

## 2017-09-16 ENCOUNTER — Other Ambulatory Visit: Payer: Self-pay | Admitting: Family Medicine

## 2017-09-17 ENCOUNTER — Other Ambulatory Visit: Payer: Self-pay

## 2017-09-17 ENCOUNTER — Ambulatory Visit: Payer: BLUE CROSS/BLUE SHIELD | Admitting: Internal Medicine

## 2017-09-17 ENCOUNTER — Encounter: Payer: Self-pay | Admitting: Internal Medicine

## 2017-09-17 VITALS — BP 114/72 | HR 77 | Temp 98.7°F | Ht 67.0 in | Wt 230.2 lb

## 2017-09-17 DIAGNOSIS — Z91040 Latex allergy status: Secondary | ICD-10-CM | POA: Diagnosis not present

## 2017-09-17 DIAGNOSIS — Z794 Long term (current) use of insulin: Secondary | ICD-10-CM

## 2017-09-17 DIAGNOSIS — Z1231 Encounter for screening mammogram for malignant neoplasm of breast: Secondary | ICD-10-CM

## 2017-09-17 DIAGNOSIS — M5489 Other dorsalgia: Secondary | ICD-10-CM | POA: Insufficient documentation

## 2017-09-17 DIAGNOSIS — E118 Type 2 diabetes mellitus with unspecified complications: Secondary | ICD-10-CM | POA: Diagnosis not present

## 2017-09-17 DIAGNOSIS — M7989 Other specified soft tissue disorders: Secondary | ICD-10-CM | POA: Diagnosis not present

## 2017-09-17 DIAGNOSIS — Z1211 Encounter for screening for malignant neoplasm of colon: Secondary | ICD-10-CM

## 2017-09-17 LAB — POCT GLYCOSYLATED HEMOGLOBIN (HGB A1C): HEMOGLOBIN A1C: 7

## 2017-09-17 MED ORDER — BACLOFEN 5 MG PO TABS: 1.0000 | ORAL_TABLET | Freq: Three times a day (TID) | ORAL | 0 refills | Status: DC

## 2017-09-17 MED ORDER — KETOROLAC TROMETHAMINE 30 MG/ML IJ SOLN
30.0000 mg | Freq: Once | INTRAMUSCULAR | Status: AC
  Administered 2017-09-17: 30 mg via INTRAMUSCULAR

## 2017-09-17 NOTE — Patient Instructions (Signed)
Please follow up for your hand in about two weeks and we will discuss possibly next steps. Your back likely has inflammation this will improve with time

## 2017-09-17 NOTE — Progress Notes (Signed)
   [name], MD Phone: [phone]  Reason For Visit: SDA for Back pain   # Skin irritation  Patient complains of skin irritation. Patient states that the powder is irriatating her hands from the gloves that she is using at work. Patient indicates that the gloves itch and burn when she takes them off. This happens every day. She would like to have a letter allowing her to use the powder free gloves. She is not sure whether these gloves are latex free or not  #Back Pain  Back pain began 5 days ago. Pain is described as staying bilaterally in lower back Patient has tried has been putting heat on it and ice hot pads. Tylenol has not helped  Pain does not radiate anywhere. History of trauma or injury: None  She states she has had this pain previously last year.  She states I prescribed her a Toradol shot and baclofen which helped with the pain.  She has been doing back exercises and states she still has the exercises available to her  Prior history of similar pain: none  History of cancer: none  Weak immune system: none  History of IV drug use: none  History of steroid UJW:[id number]:none   Symptoms Incontinence of bowel or bladder: none  Numbness of leg: none  Fever: none  Rest or Night pain:none  Weight Loss:  None  Rash: none   # Swelling in her DIP and PIP of her right hand  -She states that she will have swelling at times in her DIP and PIP joints of her right hand -She will have aching in these joints -She states that it comes and goes -Denies any history of trauma or erythema -No history of autoimmune diseases  Past Medical History Reviewed problem list.  Medications- reviewed and updated No additions to family history Social history- patient is a non- smoker  Objective: BP 114/72   Pulse 77   Temp 98.7 F (37.1 C) (Oral)   Ht 5\' 7"  (1.702 m)   Wt 230 lb 3.2 oz (104.4 kg)   SpO2 99%   BMI 36.05 kg/m  Gen: NAD, alert, cooperative  with exam Cardio: regular rate and rhythm, S1S2 heard, no murmurs appreciated Pulm: clear to auscultation bilaterally, no wheezes, rhonchi or rales Extremities: possibly swelling and second and third PIP and DIP joint of the right hand, more notable to patient then to myself Back: No abnormalities noted on inspection, no pain with extension or flexion, negative straight leg test, 5 out of 5 strength in bilateral lower extremities, neurovascularly intact Skin: Notable atopic dermatitis on left hand, dorsal surface   Assessment/Plan: See problem based a/p  Latex allergy Provided patient with a note for possible allergy to current gloves at work -discussed that she should be given latex free gloves as well as powder free gloves as this may be causing the irritation on her hands  Back pain without sciatica Likely muscle spasm -will provide a Toradol shot per patient request -will provide muscle relaxant -Patient has seen me previously for this issue and still has exercises that I prescribed at last visit about a year ago -continue these exercises -Follow-up in 2 weeks if no improvement  Swelling of right hand Notes swelling of DIP and PIP(more notable to patient than myself), no trauma, this is possibility osteoarthritis - -Will obtain a hand x-ray -Follow-up with lab work in about 2 weeks if nothing seen on the x-ray

## 2017-09-19 ENCOUNTER — Ambulatory Visit (HOSPITAL_COMMUNITY)
Admission: RE | Admit: 2017-09-19 | Discharge: 2017-09-19 | Disposition: A | Discharge status: Home / Self Care | Payer: BLUE CROSS/BLUE SHIELD | Source: Ambulatory Visit | Attending: Family Medicine | Admitting: Family Medicine

## 2017-09-19 DIAGNOSIS — Z794 Long term (current) use of insulin: Secondary | ICD-10-CM | POA: Insufficient documentation

## 2017-09-19 DIAGNOSIS — E118 Type 2 diabetes mellitus with unspecified complications: Secondary | ICD-10-CM | POA: Diagnosis present

## 2017-09-19 DIAGNOSIS — M7989 Other specified soft tissue disorders: Secondary | ICD-10-CM | POA: Diagnosis present

## 2017-09-19 DIAGNOSIS — Z91040 Latex allergy status: Secondary | ICD-10-CM | POA: Insufficient documentation

## 2017-09-19 LAB — FECAL OCCULT BLOOD, IMMUNOCHEMICAL: Fecal Occult Bld: NEGATIVE

## 2017-09-19 NOTE — Assessment & Plan Note (Signed)
Provided patient with a note for possible allergy to current gloves at work -discussed that she should be given latex free gloves as well as powder free gloves as this may be causing the irritation on her hands

## 2017-09-19 NOTE — Assessment & Plan Note (Signed)
Likely muscle spasm -will provide a Toradol shot per patient request -will provide muscle relaxant -Patient has seen me previously for this issue and still has exercises that I prescribed at last visit about a year ago -continue these exercises -Follow-up in 2 weeks if no improvement

## 2017-09-19 NOTE — Assessment & Plan Note (Signed)
Notes swelling of DIP and PIP(more notable to patient than myself), no trauma, this is possibility osteoarthritis - -Will obtain a hand x-ray -Follow-up with lab work in about 2 weeks if nothing seen on the x-ray

## 2017-10-03 ENCOUNTER — Ambulatory Visit: Payer: BLUE CROSS/BLUE SHIELD

## 2017-10-04 ENCOUNTER — Ambulatory Visit: Payer: BLUE CROSS/BLUE SHIELD | Admitting: Internal Medicine

## 2017-10-10 ENCOUNTER — Encounter: Payer: Self-pay | Admitting: Internal Medicine

## 2017-10-10 ENCOUNTER — Ambulatory Visit: Payer: BLUE CROSS/BLUE SHIELD | Admitting: Internal Medicine

## 2017-10-10 VITALS — BP 115/70 | HR 90 | Temp 98.3°F | Ht 67.0 in | Wt 231.8 lb

## 2017-10-10 DIAGNOSIS — M5489 Other dorsalgia: Secondary | ICD-10-CM | POA: Diagnosis not present

## 2017-10-10 DIAGNOSIS — M549 Dorsalgia, unspecified: Secondary | ICD-10-CM

## 2017-10-10 MED ORDER — PREDNISONE 50 MG PO TABS: 50.0000 mg | ORAL_TABLET | Freq: Every day | ORAL | 0 refills | Status: DC

## 2017-10-10 NOTE — Patient Instructions (Addendum)
I want you to try prednisone for 5 days to see if that helps. Please either call or come in for a visit if no improvement in 1 week and we will set you up with orthopedics

## 2017-10-10 NOTE — Progress Notes (Signed)
   [name] [name], MD Phone: [phone]  Reason For Visit: Follow up back pain  # Back Pain  -Patient presenting for continued back pain.  She states that her back pain is specifically on the left side around the buttocks.  She was previously seen by myself on 4/9 and prescribed a muscle relaxant.  She states she has not improved very much since that last visit.  She is also very sad and depressed because her brother passed away 3 to 4 days ago very suddenly.  She feels like this could possibly contribute to some of her continued pain.  Pain does radiate from her buttocks towards her left side.  no numbness.  No bowel or bladder incontinence.  No fevers.  No dysuria.   Past Medical History Reviewed problem list.  Medications- reviewed and updated No additions to family history Social history- patient is a non-smoker  Objective: BP 115/70 (BP Location: Left Arm, Patient Position: Sitting, Cuff Size: Large)   Pulse 90   Temp 98.3 F (36.8 C) (Oral)   Ht  (1.702 m)   Wt 231 lb 12.8 oz (105.1 kg)   SpO2 100%   BMI 36.31 kg/m  Gen: NAD, alert, cooperative with exam GI: soft, non-tender, non-distended, bowel sounds present, no hepatomegaly, no splenomegaly Back: No abnormalities noted on inspection, pain with palpation of the sacroiliac joint on the right side, normal range of motion, negative straight leg test, neurovascularly intact, 5 out of 5 lower extremity strength, Skin: dry, intact, no rashes or lesions   Assessment/Plan: See problem based a/p  Back pain without sciatica Will try a short course of prednisone to see if that helps with the pain.  Recommend patient continue Tylenol as well. Follow-up in 2 weeks if no improvement.

## 2017-10-13 ENCOUNTER — Encounter: Payer: Self-pay | Admitting: Internal Medicine

## 2017-10-13 NOTE — Assessment & Plan Note (Addendum)
Will try a short course of prednisone to see if that helps with the pain.  Recommend patient continue Tylenol as well. Follow-up in 2 weeks if no improvemen - Orthopedic referral

## 2017-10-21 ENCOUNTER — Ambulatory Visit: Payer: BLUE CROSS/BLUE SHIELD | Admitting: Internal Medicine

## 2017-10-21 ENCOUNTER — Encounter: Payer: Self-pay | Admitting: Internal Medicine

## 2017-10-21 ENCOUNTER — Ambulatory Visit (HOSPITAL_COMMUNITY)
Admission: RE | Admit: 2017-10-21 | Discharge: 2017-10-21 | Disposition: A | Discharge status: Home / Self Care | Payer: BLUE CROSS/BLUE SHIELD | Source: Ambulatory Visit | Attending: Family Medicine | Admitting: Family Medicine

## 2017-10-21 VITALS — BP 130/80 | HR 91 | Temp 98.3°F | Ht 67.0 in | Wt 231.6 lb

## 2017-10-21 DIAGNOSIS — M5136 Other intervertebral disc degeneration, lumbar region: Secondary | ICD-10-CM | POA: Diagnosis not present

## 2017-10-21 DIAGNOSIS — M5489 Other dorsalgia: Secondary | ICD-10-CM

## 2017-10-21 DIAGNOSIS — M545 Low back pain, unspecified: Secondary | ICD-10-CM

## 2017-10-21 MED ORDER — DICLOFENAC SODIUM 3 % EX CREA: 1.0000 "application " | TOPICAL_CREAM | Freq: Two times a day (BID) | CUTANEOUS | 0 refills | Status: DC

## 2017-10-21 NOTE — Progress Notes (Signed)
   [name] [name], MD Phone: [phone]  Reason For Visit: Follow up for Back Pain    # Back Pain   Back pain began 2 months ago. Patient was initial prescribed Mobic and exercise. She returned and was given a prednisone burst at the second visit. She continues to have left lower back pain. She states that it has not improved at all. 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 Patient denies any pain going down her leg. Patient states the pain does not go down her leg. This is her third visit for this issues. No trauma   Prior history of similar pain: yes, however resolved quickly  History of cancer: None  Weak immune system:  None  History of IV drug use: None  History of steroid use: None   Symptoms Incontinence of bowel or bladder:  States she pees more often, however this has been going on for a long time.  Numbness of leg: None  Fever: None  Rest or Night pain: None  Weight Loss:  None   Past Medical History Reviewed problem list.  Medications- reviewed and updated No additions to family history Social history- patient is a non -smoker  Objective: BP 130/80 (BP Location: Left Arm, Patient Position: Sitting, Cuff Size: Normal)   Pulse 91   Temp 98.3 F (36.8 C) (Oral)   Ht  (1.702 m)   Wt 231 lb 9.6 oz (105.1 kg)   SpO2 100%   BMI 36.27 kg/m  Gen: NAD, alert, cooperative with exam Extremities: warm, well perfused, No edema, cyanosis or clubbing;  MSK: No abnormalities noted inspection, tenderness to palpation of lumbar/sacral area, paraspinal tenderness, 5/5 lower extremity, no dp +2  Skin: dry, intact, no rashes or lesions  Assessment/Plan: See problem based a/p  Back pain without sciatica Continue back pain  Will obtain xrays Tylenol arthritis and diclofenac cream Follow-up in 1 week-discussed referral with patient however she would like to follow-up with me before she sees  orthopedics

## 2017-10-21 NOTE — Patient Instructions (Signed)
Please take tylenol arthritis for the pain. I have also prescribed a cream. Please get xrays and follow up in about 1 week.

## 2017-10-21 NOTE — Assessment & Plan Note (Signed)
Continue back pain  Will obtain xrays Tylenol arthritis and diclofenac cream Follow-up in 1 week-discussed referral with patient however she would like to follow-up with me before she sees orthopedics

## 2017-10-22 ENCOUNTER — Telehealth: Payer: Self-pay

## 2017-10-22 NOTE — Telephone Encounter (Addendum)
Pharmacist at [facility] left message needing clarification/verification of quantity on Diclofenac cream. States 2500 g is a large amount and pack comes in 100 gram quantity.  Call back is [phone].  Ples Specter, RN [facility] RN)

## 2017-10-23 NOTE — Telephone Encounter (Signed)
Called pharmacy resolved issue.

## 2017-10-24 ENCOUNTER — Telehealth: Payer: Self-pay

## 2017-10-24 DIAGNOSIS — M5489 Other dorsalgia: Secondary | ICD-10-CM

## 2017-10-24 MED ORDER — DICLOFENAC SODIUM 1 % TD CREA: 1.0000 | TOPICAL_CREAM | Freq: Two times a day (BID) | TRANSDERMAL | 2 refills | Status: DC | PRN

## 2017-10-24 NOTE — Telephone Encounter (Signed)
Received fax from [facility] pharmacy for PA of Diclofenac gel 3%. Preferred alternative is 1%. Please send alternative or let nurse clinic know you would like to attempt PA.   Ples Specter, RN [facility] RN)

## 2017-10-24 NOTE — Telephone Encounter (Signed)
I have sent in the diclofenac cream that is covered by insurance

## 2017-10-28 ENCOUNTER — Other Ambulatory Visit: Payer: Self-pay

## 2017-10-28 ENCOUNTER — Encounter: Payer: Self-pay | Admitting: Internal Medicine

## 2017-10-28 ENCOUNTER — Ambulatory Visit: Payer: BLUE CROSS/BLUE SHIELD | Admitting: Internal Medicine

## 2017-10-28 VITALS — BP 102/60 | HR 68 | Temp 98.6°F | Ht 67.0 in | Wt 230.0 lb

## 2017-10-28 DIAGNOSIS — E118 Type 2 diabetes mellitus with unspecified complications: Secondary | ICD-10-CM | POA: Diagnosis not present

## 2017-10-28 DIAGNOSIS — M5489 Other dorsalgia: Secondary | ICD-10-CM

## 2017-10-28 DIAGNOSIS — I10 Essential (primary) hypertension: Secondary | ICD-10-CM | POA: Diagnosis not present

## 2017-10-28 NOTE — Assessment & Plan Note (Signed)
A1C 7.0  Continue Januvia

## 2017-10-28 NOTE — Assessment & Plan Note (Signed)
Improving  Discussed radiologic findings - osteoarthritis found in the lumbar xray  Diclonfenac and icy hot  Follow up as needed

## 2017-10-28 NOTE — Patient Instructions (Signed)
You have osteoarthritis which is causing your back pain. Please continue the icy hot. Hopeful the diclofenac gel will be available soon.

## 2017-10-28 NOTE — Progress Notes (Signed)
   [name] [name], MD Phone: [phone]  Reason For Visit: Follow up Lower Back Pain   # Lower Back Pain  - Patient states that pain has improved since our last visit. She would like to try icy hot. She has not been able to get diclofeanac cream for the pharmacy, however we are going to work on that.  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 Tolerating well w/o side-effects Currently on ACEi / ARB Denies polyuria, visual changes, numbness or tingling.  # CHTN  Current Meds - lisinopril and HCTZ  Reports good compliance, took meds today. Tolerating well, w/o complaints. Denies CP, dyspnea, HA,  dizziness / lightheadedness  Past Medical History Reviewed problem list.  Medications- reviewed and updated No additions to family history Social history- patient is a non- smoker  Objective: BP 102/60   Pulse 68   Temp 98.6 F (37 C) (Oral)   Ht  (1.702 m)   Wt 230 lb (104.3 kg)   SpO2 98%   BMI 36.02 kg/m  Gen: NAD, alert, cooperative with exam Cardio: regular rate and rhythm, S1S2 heard, no murmurs appreciated Pulm: clear to auscultation bilaterally, no wheezes, rhonchi or rales Skin: dry, intact, no rashes or lesions   Assessment/Plan: See problem based a/p  Back pain without sciatica Improving  Discussed radiologic findings - osteoarthritis found in the lumbar xray  Diclonfenac and icy hot  Follow up as needed   DM type 2 (diabetes mellitus, type 2) A1C 7.0  Continue Januvia   HYPERTENSION, BENIGN SYSTEMIC Well controlled  Lisinopril and HCTZ  Follow up in 3 months

## 2017-10-28 NOTE — Assessment & Plan Note (Signed)
Well controlled  Lisinopril and HCTZ  Follow up in 3 months

## 2017-10-29 NOTE — Telephone Encounter (Signed)
Voltaren 1% gel also requiring PA. PA submitted via CoverMyMeds. Status pending. Will recheck in 24 hours. Shawna Orleans, RN

## 2017-10-31 ENCOUNTER — Encounter: Payer: Self-pay | Admitting: Internal Medicine

## 2017-11-05 NOTE — Telephone Encounter (Signed)
PA for diclofenac gel was denied. [employer] to fax letter with more information. Will forward to MD Shawna Orleans, RN

## 2017-11-27 ENCOUNTER — Other Ambulatory Visit: Payer: Self-pay

## 2017-11-27 MED ORDER — SITAGLIPTIN PHOSPHATE 100 MG PO TABS: 100.0000 mg | ORAL_TABLET | Freq: Every day | ORAL | 3 refills | Status: DC

## 2017-11-29 ENCOUNTER — Other Ambulatory Visit: Payer: Self-pay | Admitting: Internal Medicine

## 2017-11-29 MED ORDER — SENNOSIDES-DOCUSATE SODIUM 8.6-50 MG PO TABS: 1.0000 | ORAL_TABLET | Freq: Every day | ORAL | 1 refills | Status: DC

## 2017-11-29 MED ORDER — SITAGLIPTIN PHOSPHATE 100 MG PO TABS: 100.0000 mg | ORAL_TABLET | Freq: Every day | ORAL | 3 refills | Status: DC

## 2017-11-29 MED ORDER — LISINOPRIL 40 MG PO TABS: 40.0000 mg | ORAL_TABLET | Freq: Every day | ORAL | 3 refills | Status: AC

## 2017-12-16 ENCOUNTER — Other Ambulatory Visit: Payer: Self-pay

## 2017-12-16 ENCOUNTER — Ambulatory Visit: Payer: BLUE CROSS/BLUE SHIELD | Admitting: Family Medicine

## 2017-12-16 DIAGNOSIS — M5489 Other dorsalgia: Secondary | ICD-10-CM | POA: Diagnosis not present

## 2017-12-16 MED ORDER — MELOXICAM 7.5 MG PO TABS: 7.5000 mg | ORAL_TABLET | Freq: Every day | ORAL | 0 refills | Status: DC

## 2017-12-16 NOTE — Progress Notes (Signed)
   Subjective:   Patient ID: Brandi Davenport    DOB: [birth year], 62 y.o. female   MRN: [id number]  CC: back pain  HPI: Brandi Davenport is a 62 y.o. female who presents to clinic today for left hip and back pain.  Left hip pain  Patient following up for hip pain.  She was seen by Dr. Cathlean CowerMikell about 3 weeks ago for same issue.  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 She was advised to take Tylenol for her arthritis and was prescribed diclofenac which she feels has not provided any relief.  She has also been using IcyHot patches which helps .  She requests something for the pain today.   ROS: No fever, chills, nausea, vomiting.  No shortness of breath.  No weakness, numbness, tingling.   Social: former smoker, quit 30 years ago  Medications reviewed. Objective:   BP 122/72   Pulse 82   Temp 98.1 F (36.7 C) (Oral)   Ht 5\' 7"  (1.702 m)   Wt 231 lb 12.8 oz (105.1 kg)   SpO2 98%   BMI 36.31 kg/m  Vitals and nursing note reviewed.  General: 62 yo female, NAD  CV: regular rate and rhythm without murmurs rubs or gallops, 2+ pedal pulses Lungs: clear to auscultation bilaterally with normal work of breathing Abdomen: soft, +bs  MSK: Back- no obvious deformity, non-tender over lumbar spinous processes or paraspinal musculature, negative straight leg test  Skin: warm, dry, no rash  Extremities: warm and well perfused, normal tone Neuro: alert, ox3, no focal deficits, negative straight leg test   Assessment & Plan:   Back pain without sciatica Stable, no acute worsening. No red flags on exam.  I have personally reviewed her XR findings and discussed these at length with patient today.  She has significant diffuse degenerative disc and facet disease.  Would advise to continue gently stretching exercises.  Continue use of heating pad and Icy Hot for symptomatic relief. Will trial Mobic instead.    -Rx: Mobic  7.5 mg daily, #30; can consider increase to 15 mg daily if needed -return precautions discussed  -f/u with PCP in 4-6 weeks if no improvement    Meds ordered this encounter  Medications  . meloxicam (MOBIC) 7.5 MG tablet    Sig: Take 1 tablet (7.5 mg total) by mouth daily.    Dispense:  30 tablet    Refill:  0   Freddrick MarchYashika Lacole Komorowski, MD [facility], PGY-2 12/26/2017 11:12 AM

## 2017-12-16 NOTE — Patient Instructions (Addendum)
It was nice meeting you today!  You were seen in clinic for follow-up of your left hip pain.  I have reviewed your imaging and it appears your symptoms are most likely due to degenerative disc disease in your lower spine.  I am prescribing you a medication called Mobic to see if this helps.  I recommend continuing to use icy hot patches, heating pads and doing gentle stretching exercises at home.  Please follow-up in 4 to 6 weeks with your PCP if you do not have improvement.    Be well, Freddrick MarchYashika Cevin Rubinstein MD

## 2017-12-26 NOTE — Assessment & Plan Note (Signed)
Stable, no acute worsening. No red flags on exam.  I have personally reviewed her XR findings and discussed these at length with patient today.  She has significant diffuse degenerative disc and facet disease.  Would advise to continue gently stretching exercises.  Continue use of heating pad and Icy Hot for symptomatic relief. Will trial Mobic instead.    -Rx: Mobic 7.5 mg daily, #30; can consider increase to 15 mg daily if needed -return precautions discussed  -f/u with PCP in 4-6 weeks if no improvement

## 2018-02-07 ENCOUNTER — Ambulatory Visit: Payer: BLUE CROSS/BLUE SHIELD | Admitting: Family Medicine

## 2018-02-28 ENCOUNTER — Ambulatory Visit: Payer: BLUE CROSS/BLUE SHIELD | Admitting: Family Medicine

## 2018-02-28 VITALS — BP 126/70 | HR 69 | Temp 98.3°F | Ht 67.0 in | Wt 230.4 lb

## 2018-02-28 DIAGNOSIS — E118 Type 2 diabetes mellitus with unspecified complications: Secondary | ICD-10-CM | POA: Diagnosis not present

## 2018-02-28 DIAGNOSIS — I10 Essential (primary) hypertension: Secondary | ICD-10-CM | POA: Diagnosis not present

## 2018-02-28 DIAGNOSIS — M79644 Pain in right finger(s): Secondary | ICD-10-CM

## 2018-02-28 DIAGNOSIS — M5489 Other dorsalgia: Secondary | ICD-10-CM | POA: Diagnosis not present

## 2018-02-28 DIAGNOSIS — R609 Edema, unspecified: Secondary | ICD-10-CM

## 2018-02-28 LAB — POCT GLYCOSYLATED HEMOGLOBIN (HGB A1C): HbA1c, POC (controlled diabetic range): 6.9 % (ref 0.0–7.0)

## 2018-02-28 MED ORDER — HYDROCHLOROTHIAZIDE 25 MG PO TABS: 25.0000 mg | ORAL_TABLET | Freq: Every day | ORAL | 0 refills | Status: DC

## 2018-02-28 MED ORDER — DICLOFENAC SODIUM 75 MG PO TBEC: 75.0000 mg | DELAYED_RELEASE_TABLET | Freq: Two times a day (BID) | ORAL | 0 refills | Status: DC

## 2018-02-28 MED ORDER — SITAGLIPTIN PHOSPHATE 100 MG PO TABS: 100.0000 mg | ORAL_TABLET | Freq: Every day | ORAL | 3 refills | Status: DC

## 2018-02-28 MED ORDER — LOVASTATIN 40 MG PO TABS: 40.0000 mg | ORAL_TABLET | Freq: Every day | ORAL | 0 refills | Status: DC

## 2018-02-28 NOTE — Patient Instructions (Addendum)
It was great seeing you today!  Unfortunately I think your hip and back pain is mostly coming from your degenerative disc disease in your lumbar spine.  Fortunately there is not a good fix for this.  It is a chronic problem due to old age.  I will change you from the Mobic to diclofenac 75 mg 2 times per day.  I will increase her hydrochlorothiazide dosing as well to help with your swelling.  Your finger pain is a little bit perplexing, we will try this new anti-inflammatory and see if it improves.  Does not we will consider getting an MRI.  Your A1c went from 7.2-6.9.  Graduations on your improvement.

## 2018-03-01 ENCOUNTER — Encounter: Payer: Self-pay | Admitting: Family Medicine

## 2018-03-01 DIAGNOSIS — M79644 Pain in right finger(s): Secondary | ICD-10-CM | POA: Insufficient documentation

## 2018-03-01 DIAGNOSIS — R609 Edema, unspecified: Secondary | ICD-10-CM | POA: Insufficient documentation

## 2018-03-01 NOTE — Assessment & Plan Note (Signed)
Fortunately patient's chronic right buttock pain is referred pain from her lumbar spine.  X-rays show degenerative disc disease and facet arthropathy.  Discussed available treatment options With patient.  Opted to try and treat with anti-inflammatories.  Switch her from Mobic to clinic 35 mg twice daily.  Counseled that because her for treatment of Mobic she has increased risk for stomach ulcers.  [place] alarm signs.

## 2018-03-01 NOTE — Assessment & Plan Note (Signed)
Hemoglobin A1c improved from 7.2-6.9. Already on ACE inhibitor.  Continue Januvia 100 mg tablet

## 2018-03-01 NOTE — Assessment & Plan Note (Signed)
Patient with only trace edema bilateral lower extremities.  Trace edema is intact and her ankle pain somewhat will be unresponsive she has osteoarthritis and x-rays.  Following dose increase her Dyazide to 25 mg daily.  Informed her that that can her legs with compression and elevation will be very key in regards to keeping her swelling down.

## 2018-03-01 NOTE — Progress Notes (Signed)
HPI 62 year old female who presents with increased swelling in bilateral ankles.  States hydrochlorothiazide 12.5 mg and lisinopril 40 mg daily for blood pressure.  Does not feel like hydrochlorothiazide is helping.  She feels like her feet are markedly swollen in the day when she is standing for long time.  This is hurting her ankles so severely that she cannot stand at that time.  She describes at least 2+ pitting edema at this time.  When she is able to elevate her legs or in the mornings she only describes having trace edema.  Patient also having persistent right buttock pain.  It does not radiate down her leg.  Reviewed old x-rays with patient, she has degenerative disc disease in lumbar spine.  Discussed treatment options with patient.  For conservative management with anti-inflammatories, short course.  Did discuss the route of spinal injections with patient.  Also discussed the necessity for weight loss.  CC: Bilateral leg swelling, right buttock pain   ROS:   Review of Systems See HPI for ROS.   CC, SH/smoking status, and VS noted  Objective: BP 126/70   Pulse 69   Temp 98.3 F (36.8 C)   Ht 5\' 7"  (1.702 m)   Wt 230 lb 6.4 oz (104.5 kg)   SpO2 100%   BMI 36.09 kg/m  Gen: NAD, alert, cooperative, and pleasant.  Obese, African-American, female resting comfortably on exam table. CV: RRR, no murmur.  Trace pitting edema bilateral lower extremity. Resp: CTAB, no wheezes, non-labored Abd: SNTND, BS present, no guarding or organomegaly Ext: No edema, warm Neuro: Alert and oriented, Speech clear, No gross deficits R buttock: Point tender near the greater trochanter of the right buttock.  Negative straight leg test.  Personally reviewed lumbar spine x-rays: Shows degenerative disc disease, disc narrowing at L4-L5.  Discussed with patient.  X-ray also shows facet arthropathy in his distributions, along with osteoarthritis between discs.  Assessment and plan:  Back pain without  sciatica Fortunately patient's chronic right buttock pain is referred pain from her lumbar spine.  X-rays show degenerative disc disease and facet arthropathy.  Discussed available treatment options With patient.  Opted to try and treat with anti-inflammatories.  Switch her from Mobic to clinic 35 mg twice daily.  Counseled that because her for treatment of Mobic she has increased risk for stomach ulcers.  [place] alarm signs.  Swelling Patient with only trace edema bilateral lower extremities.  Trace edema is intact and her ankle pain somewhat will be unresponsive she has osteoarthritis and x-rays.  Following dose increase her Dyazide to 25 mg daily.  Informed her that that can her legs with compression and elevation will be very key in regards to keeping her swelling down.  Finger pain, right Patient is unable to flex the index finger right hand.  She has no known traumatic injury prior to this.  She states that this flares up usually when she has arthritis.  She had a hand x-ray which showed no abnormalities, very minimal osteoarthritis.  See how she responds to diclofenac.  Continues might warrant a hand MRI to evaluate flexor ligaments.  DM type 2 (diabetes mellitus, type 2) Hemoglobin A1c improved from 7.2-6.9. Already on ACE inhibitor.  Continue Januvia 100 mg tablet   Orders Placed This Encounter  Procedures  . HgB A1c    Meds ordered this encounter  Medications  . hydrochlorothiazide (HYDRODIURIL) 25 MG tablet    Sig: Take 1 tablet (25 mg total) by mouth daily.  Dispense:  90 tablet    Refill:  0  . lovastatin (MEVACOR) 40 MG tablet    Sig: Take 1 tablet (40 mg total) by mouth at bedtime.    Dispense:  90 tablet    Refill:  0  . DISCONTD: sitaGLIPtin (JANUVIA) 100 MG tablet    Sig: Take 1 tablet (100 mg total) by mouth daily.    Dispense:  30 tablet    Refill:  3  . diclofenac (VOLTAREN) 75 MG EC tablet    Sig: Take 1 tablet (75 mg total) by mouth 2 (two) times daily.     Dispense:  30 tablet    Refill:  0  . sitaGLIPtin (JANUVIA) 100 MG tablet    Sig: Take 1 tablet (100 mg total) by mouth daily.    Dispense:  30 tablet    Refill:  3     Myrene BuddyJacob Auriana Scalia MD PGY-2 Family Medicine Resident  03/01/2018 10:34 AM

## 2018-03-01 NOTE — Assessment & Plan Note (Signed)
Patient is unable to flex the index finger right hand.  She has no known traumatic injury prior to this.  She states that this flares up usually when she has arthritis.  She had a hand x-ray which showed no abnormalities, very minimal osteoarthritis.  See how she responds to diclofenac.  Continues might warrant a hand MRI to evaluate flexor ligaments.

## 2018-04-22 ENCOUNTER — Ambulatory Visit (INDEPENDENT_AMBULATORY_CARE_PROVIDER_SITE_OTHER): Payer: BLUE CROSS/BLUE SHIELD

## 2018-04-22 DIAGNOSIS — Z23 Encounter for immunization: Secondary | ICD-10-CM

## 2018-05-16 ENCOUNTER — Ambulatory Visit: Payer: BLUE CROSS/BLUE SHIELD | Admitting: Family Medicine

## 2018-05-23 ENCOUNTER — Other Ambulatory Visit: Payer: Self-pay

## 2018-05-23 ENCOUNTER — Ambulatory Visit: Payer: BLUE CROSS/BLUE SHIELD | Admitting: Family Medicine

## 2018-05-23 ENCOUNTER — Encounter: Payer: Self-pay | Admitting: Family Medicine

## 2018-05-23 VITALS — BP 120/82 | HR 84 | Temp 98.6°F | Ht 67.0 in | Wt 235.0 lb

## 2018-05-23 DIAGNOSIS — M778 Other enthesopathies, not elsewhere classified: Secondary | ICD-10-CM

## 2018-05-23 DIAGNOSIS — L304 Erythema intertrigo: Secondary | ICD-10-CM

## 2018-05-23 DIAGNOSIS — M7581 Other shoulder lesions, right shoulder: Secondary | ICD-10-CM

## 2018-05-23 MED ORDER — KETOCONAZOLE 2 % EX CREA: 1.0000 "application " | TOPICAL_CREAM | Freq: Every day | CUTANEOUS | 0 refills | Status: AC
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
## 2018-05-26 ENCOUNTER — Encounter: Payer: Self-pay | Admitting: Family Medicine

## 2018-05-26 DIAGNOSIS — M778 Other enthesopathies, not elsewhere classified: Secondary | ICD-10-CM | POA: Insufficient documentation

## 2018-05-26 DIAGNOSIS — L304 Erythema intertrigo: Secondary | ICD-10-CM | POA: Insufficient documentation

## 2018-05-26 DIAGNOSIS — M7581 Other shoulder lesions, right shoulder: Principal | ICD-10-CM
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
## 2018-05-26 NOTE — Assessment & Plan Note (Signed)
Likely has intertrigo secondary to yeast infection.  Explained to patient that the mainstay of treatment will be to keep her areas dry.  Whenever possible strategies to help with that.  Also prescribed ketoconazole ointment.  Apply 1 application topically daily for 4 weeks
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
## 2018-06-08 ENCOUNTER — Other Ambulatory Visit: Payer: Self-pay | Admitting: Internal Medicine

## 2018-06-08 DIAGNOSIS — I10 Essential (primary) hypertension: Secondary | ICD-10-CM

## 2018-06-09 ENCOUNTER — Other Ambulatory Visit: Payer: Self-pay | Admitting: Family Medicine

## 2018-06-09 DIAGNOSIS — I10 Essential (primary) hypertension: Secondary | ICD-10-CM

## 2018-06-09 MED ORDER — HYDROCHLOROTHIAZIDE 25 MG PO TABS: ORAL_TABLET | ORAL | 0 refills | Status: AC

## 2018-06-30 ENCOUNTER — Ambulatory Visit: Payer: BLUE CROSS/BLUE SHIELD

## 2019-03-18 ENCOUNTER — Other Ambulatory Visit: Payer: Self-pay | Admitting: Family Medicine

## 2019-03-19 ENCOUNTER — Other Ambulatory Visit: Payer: Self-pay | Admitting: Family Medicine

## 2019-03-19 MED ORDER — SITAGLIPTIN PHOSPHATE 100 MG PO TABS: 100.0000 mg | ORAL_TABLET | Freq: Every day | ORAL | 0 refills | Status: DC

## 2019-10-06 ENCOUNTER — Other Ambulatory Visit: Payer: Self-pay | Admitting: Family Medicine

## 2020-11-16 LAB — COLOGUARD: COLOGUARD: NEGATIVE

## 2022-06-02 ENCOUNTER — Ambulatory Visit (HOSPITAL_COMMUNITY)
Admission: EM | Admit: 2022-06-02 | Discharge: 2022-06-02 | Disposition: A | Discharge status: Home / Self Care | Payer: BLUE CROSS/BLUE SHIELD | Attending: Emergency Medicine | Admitting: Emergency Medicine

## 2022-06-02 ENCOUNTER — Encounter (HOSPITAL_COMMUNITY): Payer: Self-pay | Admitting: Emergency Medicine

## 2022-06-02 DIAGNOSIS — J069 Acute upper respiratory infection, unspecified: Secondary | ICD-10-CM | POA: Diagnosis not present

## 2022-06-02 DIAGNOSIS — J0140 Acute pansinusitis, unspecified: Secondary | ICD-10-CM | POA: Diagnosis not present

## 2022-06-02 MED ORDER — ALBUTEROL SULFATE HFA 108 (90 BASE) MCG/ACT IN AERS: 2.0000 | INHALATION_SPRAY | RESPIRATORY_TRACT | 0 refills | Status: DC | PRN

## 2022-06-02 MED ORDER — BENZONATATE 100 MG PO CAPS: 100.0000 mg | ORAL_CAPSULE | Freq: Three times a day (TID) | ORAL | 0 refills | Status: DC

## 2022-06-02 MED ORDER — AMOXICILLIN-POT CLAVULANATE 875-125 MG PO TABS: 1.0000 | ORAL_TABLET | Freq: Two times a day (BID) | ORAL | 0 refills | Status: DC

## 2022-06-02 NOTE — ED Provider Notes (Addendum)
[place]   [id number] 06/02/22 Arrival Time: 2297   Chief Complaint  Patient presents with   Cough   Nasal Congestion     SUBJECTIVE: History from: patient.  Brandi Davenport is a 66 y.o. female who who presented to the urgent care with a complaint of chills, fever, cough, congestion, sore throat for the past 6 days.  She reports sinus pressure with yellowish nasal discharge.  Denies sick exposure to COVID, flu or strep.  She reports she tested negative for COVID at home and at work.  Denies recent travel.  Has tried OTC Mucinex with mild relief symptoms are made worse at nighttime..  Denies previous symptoms in the past.   Denies  fatigue,  rhinorrhea,  SOB, wheezing, chest pain, nausea, changes in bowel or bladder habits.    ROS: As per HPI.  All other pertinent ROS negative.      Past Medical History:  Diagnosis Date   Diabetes mellitus without complication ([place])    Hyperlipidemia    Hypertension    Past Surgical History:  Procedure Laterality Date   ABDOMINAL HYSTERECTOMY     tooth removal     TUBAL LIGATION     Allergies  Allergen Reactions   Codeine Phosphate     REACTION: unspecified   Tramadol     Chest pain    No current facility-administered medications on file prior to encounter.   Current Outpatient Medications on File Prior to Encounter  Medication Sig Dispense Refill   acetaminophen (TYLENOL) 650 MG CR tablet Take 2 tablets (1,300 mg total) by mouth 2 (two) times daily. 60 tablet 3   aspirin 81 MG EC tablet Take 81 mg by mouth daily.       Blood Glucose Monitoring Suppl (ONE TOUCH ULTRA 2) W/DEVICE KIT 2 mg by Does not apply route daily. 1 each 0   diclofenac (VOLTAREN) 75 MG EC tablet Take 1 tablet (75 mg total) by mouth 2 (two) times daily. 30 tablet 0   Diclofenac Sodium 1 % CREA Place 1 Dose onto the skin 2 (two) times daily as needed. Apply to back. 120 g 2   gabapentin (NEURONTIN) 100 MG capsule Take 1 capsule (100 mg total) by  mouth 3 (three) times daily. 90 capsule 3   glucose blood (ONE TOUCH ULTRA TEST) test strip Use as instructed 100 each 12   hydrochlorothiazide (HYDRODIURIL) 25 MG tablet TAKE 1/2 (ONE-HALF) TABLET BY MOUTH DAILY 45 tablet 0   JANUVIA 100 MG tablet Take 1 tablet by mouth once daily 30 tablet 0   ketoconazole (NIZORAL) 2 % cream Apply 1 application topically daily. 15 g 0   Lancets (ONETOUCH ULTRASOFT) lancets Use as instructed 100 each 12   lisinopril (PRINIVIL,ZESTRIL) 40 MG tablet Take 1 tablet (40 mg total) by mouth daily. 90 tablet 3   lisinopril (PRINIVIL,ZESTRIL) 40 MG tablet Take 1 tablet (40 mg total) by mouth daily. 90 tablet 3   lovastatin (MEVACOR) 40 MG tablet Take 1 tablet (40 mg total) by mouth at bedtime. 90 tablet 0   ranitidine (ZANTAC) 150 MG capsule Take 1 capsule (150 mg total) by mouth 2 (two) times daily. 60 capsule 0   senna-docusate (SENOKOT-S) 8.6-50 MG tablet Take 1 tablet by mouth daily. 30 tablet 1   traZODone (DESYREL) 50 MG tablet Take 1 tablet (50 mg total) by mouth at bedtime as needed for sleep. 30 tablet 3   Social History   Socioeconomic History   Marital status: Legally  Separated    Spouse name: Not on file   Number of children: Not on file   Years of education: Not on file   Highest education level: Not on file  Occupational History   Not on file  Tobacco Use   Smoking status: Never   Smokeless tobacco: Never  Substance and Sexual Activity   Alcohol use: No   Drug use: No   Sexual activity: Not on file  Other Topics Concern   Not on file  Social History Narrative   Not on file   Social Determinants of Health   Financial Resource Strain: Not on file  Food Insecurity: Not on file  Transportation Needs: Not on file  Physical Activity: Not on file  Stress: Not on file  Social Connections: Not on file  Intimate Partner Violence: Not on file   No family history on file.  OBJECTIVE:  Vitals:   06/02/22 1545  BP: (!) 141/82  Pulse: 92   Resp: 20  Temp: 99 F (37.2 C)  TempSrc: Oral  SpO2: 98%     General appearance: alert; appears fatigued, but nontoxic; speaking in full sentences and tolerating own secretions HEENT: NCAT; Ears: EACs clear, TMs pearly gray; Eyes: PERRL.  EOM grossly intact. Sinuses: Maxillary sinus tender; Nose: nares patent without rhinorrhea, Throat: oropharynx clear, tonsils non erythematous or enlarged, uvula midline  Neck: supple without LAD Lungs: unlabored respirations, symmetrical air entry; cough: moderate; no respiratory distress; CTAB Heart: regular rate and rhythm.  Radial pulses 2+ symmetrical bilaterally Skin: warm and dry Psychological: alert and cooperative; normal mood and affect  LABS:  No results found for this or any previous visit (from the past 24 hour(s)).   ASSESSMENT & PLAN:  1. URI with cough and congestion   2. Acute non-recurrent pansinusitis     Meds ordered this encounter  Medications   albuterol (VENTOLIN HFA) 108 (90 Base) MCG/ACT inhaler    Sig: Inhale 2 puffs into the lungs every 4 (four) hours as needed for wheezing or shortness of breath.    Dispense:  18 g    Refill:  0   amoxicillin-clavulanate (AUGMENTIN) 875-125 MG tablet    Sig: Take 1 tablet by mouth every 12 (twelve) hours.    Dispense:  14 tablet    Refill:  0   benzonatate (TESSALON) 100 MG capsule    Sig: Take 1 capsule (100 mg total) by mouth every 8 (eight) hours.    Dispense:  21 capsule    Refill:  0   Discharge instructions  Get plenty of rest and push fluids Augmentin was prescribed/take as directed Tessalon Perles prescribed for cough Albuterol was prescribed Use zyrtec for nasal congestion, runny nose, and/or sore throat Use flonase for nasal congestion and runny nose Use medications daily for symptom relief Use OTC medications like ibuprofen or tylenol as needed fever or pain Follow up with PCP in 1-2 days via phone or e-visit for recheck and to ensure symptoms are  improving Call or go to the ED if you have any new or worsening symptoms such as fever, worsening cough, shortness of breath, chest tightness, chest pain, turning blue, changes in mental status, etc...      Reviewed expectations re: course of current medical issues. Questions answered. Outlined signs and symptoms indicating need for more acute intervention. Patient verbalized understanding. After Visit Summary given.          Emerson Monte, FNP 06/02/22 1617    Krayton Wortley, Darrelyn Hillock,  FNP 06/02/22 1618    Emerson Monte, FNP 06/02/22 [phone]

## 2022-06-02 NOTE — Discharge Instructions (Addendum)
Get plenty of rest and push fluids Augmentin was prescribed/take as directed Tessalon Perles prescribed for cough Albuterol was prescribed Use zyrtec for nasal congestion, runny nose, and/or sore throat Use flonase for nasal congestion and runny nose Use medications daily for symptom relief Use OTC medications like ibuprofen or tylenol as needed fever or pain Follow up with PCP in 1-2 days via phone or e-visit for recheck and to ensure symptoms are improving Call or go to the ED if you have any new or worsening symptoms such as fever, worsening cough, shortness of breath, chest tightness, chest pain, turning blue, changes in mental status, etc..Marland Kitchen

## 2022-06-02 NOTE — ED Triage Notes (Signed)
Since MOnday having cough, congestion chills, sore throat, and aches. Took Mucinex which didn't help.

## 2022-06-14 ENCOUNTER — Encounter: Payer: Self-pay | Admitting: Nurse Practitioner

## 2022-06-14 ENCOUNTER — Ambulatory Visit: Payer: BLUE CROSS/BLUE SHIELD | Admitting: Nurse Practitioner

## 2022-06-14 VITALS — BP 122/70 | HR 66 | Temp 97.3°F

## 2022-06-14 DIAGNOSIS — J0100 Acute maxillary sinusitis, unspecified: Secondary | ICD-10-CM

## 2022-06-14 MED ORDER — FLUTICASONE PROPIONATE 50 MCG/ACT NA SUSP: 2.0000 | Freq: Every day | NASAL | 0 refills | Status: DC

## 2022-06-14 MED ORDER — PREDNISONE 10 MG PO TABS: ORAL_TABLET | ORAL | 0 refills | Status: AC

## 2022-06-14 NOTE — Progress Notes (Signed)
Acute Office Visit  Subjective:     Patient ID: Edwina Barth, female    DOB: 1956/05/04, 67 y.o.   MRN: [id number]  Chief Complaint  Patient presents with   URI   Patient presents today for c/o of ongoing cough, congestion, and sinus pain.  Initially seen in urgent care 12/23 for fevers, chills, cough, congestion x 6 days.  She tested negative for COVID. Was treated with albuterol, Augmentin, and tessalon perles.   Patient reports antibiotic  seemed to work for fevers but she continues to experience congestion, cough with sputum production, and rhinorrhea. Completed abx on Saturday ( 5-6 days ago). Reports Tessalon perles didn't work with cough and currently she is using mucinex only. .  Patient had hx of type 2 DM, well controlled on medications. Last A1c was 6.9, 3-4 months ago. She states her blood sugar was 92 this morning.    Review of Systems  Constitutional:  Negative for chills, fever and malaise/fatigue.  HENT:  Positive for congestion and sinus pain. Negative for ear pain, hearing loss and sore throat.   Eyes:  Negative for blurred vision.  Respiratory:  Positive for cough and sputum production. Negative for shortness of breath and wheezing.   Cardiovascular:  Negative for chest pain.  Gastrointestinal:  Negative for abdominal pain, constipation, diarrhea, nausea and vomiting.  Genitourinary:  Negative for dysuria, frequency and urgency.  Musculoskeletal:  Negative for joint pain.  Neurological:  Negative for dizziness, weakness and headaches.        Objective:    BP 122/70   Pulse 66   Temp (!) 97.3 F (36.3 C)   SpO2 98%    Physical Exam Constitutional:      General: She is not in acute distress. HENT:     Head: Normocephalic.     Right Ear: There is no impacted cerumen.     Left Ear: There is no impacted cerumen.     Nose:     Right Sinus: Maxillary sinus tenderness present. No frontal sinus tenderness.     Left Sinus: Maxillary sinus tenderness  present. No frontal sinus tenderness.     Mouth/Throat:     Mouth: Mucous membranes are moist.     Pharynx: Oropharynx is clear. No oropharyngeal exudate or posterior oropharyngeal erythema.  Cardiovascular:     Rate and Rhythm: Normal rate and regular rhythm.     Heart sounds: Normal heart sounds.  Pulmonary:     Effort: Pulmonary effort is normal.     Breath sounds: Normal breath sounds.  Musculoskeletal:        General: Normal range of motion.  Lymphadenopathy:     Cervical: No cervical adenopathy.  Skin:    General: Skin is warm.  Neurological:     General: No focal deficit present.     Mental Status: She is alert and oriented to person, place, and time.     No results found for any visits on 06/14/22.      Assessment & Plan:   Problem List Items Addressed This Visit   None Visit Diagnoses     Acute non-recurrent maxillary sinusitis    -  Primary   Relevant Medications   fluticasone (FLONASE) 50 MCG/ACT nasal spray   predniSONE (DELTASONE) 10 MG tablet     Due to continued symptoms despite abx treatment, will treat with prednisone taper. Discussed with patient to check blood sugars closely while on prednisone. Encouraged to monitor sugar and carb intake. Continue mucinex and will  also trial flonase. Can also try delsym for cough. Discussed supportive care measures such as use of steam to help with congestion.   Meds ordered this encounter  Medications   fluticasone (FLONASE) 50 MCG/ACT nasal spray    Sig: Place 2 sprays into both nostrils daily.    Dispense:  16 g    Refill:  0    Order Specific Question:   Supervising Provider    Answer:   Odis Luster [[id number]]   predniSONE (DELTASONE) 10 MG tablet    Sig: Take 4 tablets (40 mg total) by mouth daily with breakfast for 2 days, THEN 3 tablets (30 mg total) daily with breakfast for 2 days, THEN 2 tablets (20 mg total) daily with breakfast for 2 days, THEN 1 tablet (10 mg total) daily with breakfast for 2 days.     Dispense:  20 tablet    Refill:  0    Order Specific Question:   Supervising Provider    Answer:   Odis Luster [[id number]]    Follow-up as needed.   Lurena Joiner, NP

## 2022-09-19 ENCOUNTER — Emergency Department (HOSPITAL_COMMUNITY): Payer: Worker's Compensation

## 2022-09-19 ENCOUNTER — Other Ambulatory Visit: Payer: Self-pay

## 2022-09-19 ENCOUNTER — Encounter (HOSPITAL_COMMUNITY): Payer: Self-pay

## 2022-09-19 ENCOUNTER — Emergency Department (HOSPITAL_COMMUNITY)
Admission: EM | Admit: 2022-09-19 | Discharge: 2022-09-19 | Disposition: A | Discharge status: Home / Self Care | Payer: Worker's Compensation | Attending: Emergency Medicine | Admitting: Emergency Medicine

## 2022-09-19 DIAGNOSIS — S8991XA Unspecified injury of right lower leg, initial encounter: Secondary | ICD-10-CM | POA: Diagnosis present

## 2022-09-19 DIAGNOSIS — E119 Type 2 diabetes mellitus without complications: Secondary | ICD-10-CM | POA: Insufficient documentation

## 2022-09-19 DIAGNOSIS — W19XXXA Unspecified fall, initial encounter: Secondary | ICD-10-CM | POA: Insufficient documentation

## 2022-09-19 DIAGNOSIS — Z7982 Long term (current) use of aspirin: Secondary | ICD-10-CM | POA: Insufficient documentation

## 2022-09-19 DIAGNOSIS — Z79899 Other long term (current) drug therapy: Secondary | ICD-10-CM | POA: Insufficient documentation

## 2022-09-19 DIAGNOSIS — M25561 Pain in right knee: Secondary | ICD-10-CM | POA: Diagnosis not present

## 2022-09-19 DIAGNOSIS — S8391XA Sprain of unspecified site of right knee, initial encounter: Secondary | ICD-10-CM | POA: Insufficient documentation

## 2022-09-19 DIAGNOSIS — Z91040 Latex allergy status: Secondary | ICD-10-CM | POA: Diagnosis not present

## 2022-09-19 DIAGNOSIS — I10 Essential (primary) hypertension: Secondary | ICD-10-CM | POA: Diagnosis not present

## 2022-09-19 DIAGNOSIS — Z7984 Long term (current) use of oral hypoglycemic drugs: Secondary | ICD-10-CM | POA: Diagnosis not present

## 2022-09-19 MED ORDER — IBUPROFEN 600 MG PO TABS: 600.0000 mg | ORAL_TABLET | Freq: Three times a day (TID) | ORAL | 0 refills | Status: AC | PRN

## 2022-09-19 NOTE — ED Triage Notes (Signed)
BIBA from work with mechanical fall falling on right side. Pain to right knee.   Denies blood thinner usage Denies hitting head  Denies neck pain.  A&O x4 on arrival.

## 2022-09-19 NOTE — Discharge Instructions (Signed)
Your x-ray showed some age-related changes to the bones like arthritis but no injuries related to your fall today.  You have likely sprained your knee.  I provided you with a brace to use over the next few days as you recover.  I also provided some exercises that you may find beneficial to help you recover from your knee pain and to rehabilitate it at home.  Please take ibuprofen as prescribed for the next few days to help decrease pain and inflammation.  You may also take Tylenol on top of this.  You may take up to 1000 mg Tylenol every 6 hours.  Do not take more than 4000 mg in 24 hours.  If her symptoms or not getting better within a week, please follow-up with orthopedics or your PCP.  I provided information for an orthopedic you may follow-up with as needed.  If you do not have a PCP currently or would like to change, I also provided information for 2 additional primary care clinics.  For any new injury or worsening symptoms, please return to the nearest emergency department for reevaluation.

## 2022-09-19 NOTE — ED Provider Notes (Signed)
[place] EMERGENCY DEPARTMENT AT [facility] Provider Note   CSN: [id number] Arrival date & time: 09/19/22  0051     History  Chief Complaint  Patient presents with   Knee Pain    Brandi Davenport is a 67 y.o. female with past medical history hypertension, hyperlipidemia, type 2 diabetes who presents to the ED after a mechanical fall complaining of right knee pain.  Patient states that she was at work this morning when she accidentally tripped over her back that was sitting on the floor on ground level and fell hitting her knee.  She has been unable to ambulate since the injury.  She denies a history of problems with her knee.  She denies hitting her head, loss of consciousness, or other injuries.      Home Medications Prior to Admission medications   Medication Sig Start Date End Date Taking? Authorizing Provider  ibuprofen (ADVIL) 600 MG tablet Take 1 tablet (600 mg total) by mouth every 8 (eight) hours as needed for up to 5 days for mild pain or moderate pain. 09/19/22 09/24/22 Yes Mykeal Carrick L, PA-C  albuterol (VENTOLIN HFA) 108 (90 Base) MCG/ACT inhaler Inhale 2 puffs into the lungs every 4 (four) hours as needed for wheezing or shortness of breath. 06/02/22   Avegno, Zachery Dakins, FNP  aspirin 81 MG EC tablet Take 81 mg by mouth daily.      [provider]  Blood Glucose Monitoring Suppl (ONE TOUCH ULTRA 2) W/DEVICE KIT 2 mg by Does not apply route daily. 01/26/15   Myra Rude, MD  diclofenac (VOLTAREN) 75 MG EC tablet Take 1 tablet (75 mg total) by mouth 2 (two) times daily. 02/28/18   Myrene Buddy, MD  fluticasone (FLONASE) 50 MCG/ACT nasal spray Place 2 sprays into both nostrils daily. 06/14/22   Gloris Ham, NP  gabapentin (NEURONTIN) 100 MG capsule Take 1 capsule (100 mg total) by mouth 3 (three) times daily. 05/08/17   Mikell, Antionette Poles, MD  glucose blood (ONE TOUCH ULTRA TEST) test strip Use as instructed 01/26/15   Myra Rude, MD  hydrochlorothiazide (HYDRODIURIL) 25 MG tablet TAKE 1/2 (ONE-HALF) TABLET BY MOUTH DAILY 06/09/18   Myrene Buddy, MD  JANUVIA 100 MG tablet Take 1 tablet by mouth once daily 10/06/19   Myrene Buddy, MD  ketoconazole (NIZORAL) 2 % cream Apply 1 application topically daily. 05/23/18   Myrene Buddy, MD  Lancets [facility] ULTRASOFT) lancets Use as instructed 01/26/15   Myra Rude, MD  lisinopril (PRINIVIL,ZESTRIL) 40 MG tablet Take 1 tablet (40 mg total) by mouth daily. 11/29/17   Casey Burkitt, MD  lovastatin (MEVACOR) 40 MG tablet Take 1 tablet (40 mg total) by mouth at bedtime. 02/28/18   Myrene Buddy, MD  senna-docusate (SENOKOT-S) 8.6-50 MG tablet Take 1 tablet by mouth daily. 11/29/17   Casey Burkitt, MD  traZODone (DESYREL) 50 MG tablet Take 1 tablet (50 mg total) by mouth at bedtime as needed for sleep. 08/15/16   Berton Bon, MD      Allergies    Codeine phosphate, Gabapentin, Metformin, Tramadol, and Latex    Review of Systems   Review of Systems  All other systems reviewed and are negative.   Physical Exam Updated Vital Signs BP (!) 150/73 (BP Location: Right Arm)   Pulse 82   Temp 98.2 F (36.8 C) (Oral)   Resp 18   Wt 106 kg   SpO2 100%   BMI  36.60 kg/m  Physical Exam Vitals and nursing note reviewed.  Constitutional:      General: She is not in acute distress.    Appearance: Normal appearance. She is not ill-appearing or toxic-appearing.  HENT:     Head: Normocephalic and atraumatic.     Mouth/Throat:     Mouth: Mucous membranes are moist.  Eyes:     Conjunctiva/sclera: Conjunctivae normal.  Cardiovascular:     Rate and Rhythm: Normal rate and regular rhythm.     Heart sounds: No murmur heard. Pulmonary:     Effort: Pulmonary effort is normal.     Breath sounds: Normal breath sounds.  Abdominal:     General: Abdomen is flat.     Palpations: Abdomen is soft.  Musculoskeletal:     Cervical back: Normal  range of motion and neck supple. No rigidity.     Comments: Moderate swelling diffusely over right knee with tenderness over the patella, no posterior or medial/lateral tenderness, no tenderness over the right tibia fibula, no tenderness over the right ankle or foot, active flexion and extension of right knee intact, 2+ DP and PT bilateral pulses, no abrasions, lacerations, wounds, or other overlying skin changes on right knee, no calf tenderness, Achilles appears intact, soft compartments and normal sensation, no obvious deformity to bilateral upper and lower extremities and moving bilateral upper and left upper extremity spontaneously without difficulty or eliciting pain  Skin:    General: Skin is warm and dry.     Capillary Refill: Capillary refill takes less than 2 seconds.  Neurological:     Mental Status: She is alert. Mental status is at baseline.  Psychiatric:        Behavior: Behavior normal.     ED Results / Procedures / Treatments   Labs (all labs ordered are listed, but only abnormal results are displayed) Labs Reviewed - No data to display  EKG None  Radiology DG Knee Complete 4 Views Right  Result Date: 09/19/2022 CLINICAL DATA:  Pain EXAM: RIGHT KNEE - COMPLETE 4 VIEW COMPARISON:  05/06/2016. FINDINGS: No evidence of fracture, dislocation, or joint effusion. Mild degenerative changes identified medially with small osteophytes. There are superior and inferior patellar spurs. IMPRESSION: Degenerative changes.  No acute osseous abnormalities identified. Electronically Signed   By: Layla Maw M.D.   On: 09/19/2022 09:01    Procedures Procedures    Medications Ordered in ED Medications - No data to display  ED Course/ Medical Decision Making/ A&P                             Medical Decision Making Amount and/or Complexity of Data Reviewed Radiology: ordered. Decision-making details documented in ED Course.  Risk Prescription drug management.   Medical  Decision Making:   Brandi Davenport is a 67 y.o. female who presented to the ED today with right knee pain detailed above.    Patient's presentation is complicated by their history of injury, age.  Complete initial physical exam performed, notably the patient was in no acute distress.  She had moderate swelling over the right knee.  No obvious deformity.  Neurovascularly intact.  Tenderness over the patella.  No superior or inferior tenderness to this area.   Soft, do not suspect compartment syndrome.  Active extension and flexion intact.  Moving remainder of extremities spontaneously. Reviewed and confirmed nursing documentation for past medical history, family history, social history.    Initial Assessment:  With the patient's presentation of right knee pain, differential diagnosis includes but is not limited to sprain, strain, joint effusion, abrasion, laceration, contusion, fracture, dislocation.  This is most consistent with an acute complicated illness  Initial Plan:  X-ray to evaluate for bony pathology Offered pain management but patient declines Objective evaluation as below reviewed   Initial Study Results:   Radiology:  All images reviewed independently. Agree with radiology report at this time.   DG Knee Complete 4 Views Right  Result Date: 09/19/2022 CLINICAL DATA:  Pain EXAM: RIGHT KNEE - COMPLETE 4 VIEW COMPARISON:  05/06/2016. FINDINGS: No evidence of fracture, dislocation, or joint effusion. Mild degenerative changes identified medially with small osteophytes. There are superior and inferior patellar spurs. IMPRESSION: Degenerative changes.  No acute osseous abnormalities identified. Electronically Signed   By: Layla MawJoshua  Pleasure M.D.   On: 09/19/2022 09:01    Final Assessment and Plan:   67 year old female presents to the ED complaining of right knee pain post mechanical fall.  She has swelling over the knee and tenderness over the patella.  She is neurovascularly intact.   Active range of motion while sitting is intact.  No tenderness over superior or inferior connecting joints.  No wounds noted to the knee.  No obvious deformity.  Otherwise exam benign as stated above.  Soft compartments, do not suspect compartment syndrome.  No skin changes, do not suspect septic joint.  X-ray with degenerative changes but no acute findings.  Suspect right knee sprain.  Patient declined pain management in the ED.  Will discharge her home with ibuprofen and Tylenol with knee brace and have her follow-up with primary care or orthopedics as needed.  Strict ED return precautions given, all questions answered, and stable for discharge.   Clinical Impression:  1. Sprain of right knee, unspecified ligament, initial encounter      Discharge           Final Clinical Impression(s) / ED Diagnoses Final diagnoses:  Sprain of right knee, unspecified ligament, initial encounter    Rx / DC Orders ED Discharge Orders          Ordered    ibuprofen (ADVIL) 600 MG tablet  Every 8 hours PRN        09/19/22 0922              Tonette LedererGowens, Kasaundra Fahrney L, PA-C 09/19/22 0930    Sloan LeiterGray, Samuel A, DO 09/27/22 1538

## 2022-11-08 ENCOUNTER — Ambulatory Visit: Payer: BLUE CROSS/BLUE SHIELD | Admitting: Nurse Practitioner

## 2022-11-08 ENCOUNTER — Encounter: Payer: Self-pay | Admitting: Nurse Practitioner

## 2022-11-08 VITALS — BP 148/70 | HR 76

## 2022-11-08 DIAGNOSIS — H6693 Otitis media, unspecified, bilateral: Secondary | ICD-10-CM

## 2022-11-08 MED ORDER — AMOXICILLIN-POT CLAVULANATE 875-125 MG PO TABS: 1.0000 | ORAL_TABLET | Freq: Two times a day (BID) | ORAL | 0 refills | Status: DC

## 2022-11-08 NOTE — Progress Notes (Signed)
Acute Office Visit  Subjective:     Patient ID: Brandi Davenport, female    DOB: Feb 13, 1956, 67 y.o.   MRN: [id number]  Chief Complaint  Patient presents with   Dizziness    And ear pain    Patient is in today for c/o of dizziness x1 week. Reports dizziness is worse with certain positional changes, such as bending over or getting up in the morning.  Monday night she noticed tinnitus in her right ear. Now endorses bilateral ear pain.  No fevers. Denies hearing loss or discharge from ear.  Hx of seasonal allergies, currently not using flonase or daily antihistamine.   Review of Systems  Constitutional:  Negative for chills and fever.  HENT:  Positive for ear pain and tinnitus. Negative for hearing loss and sore throat.   Respiratory:  Positive for cough (at night).   Cardiovascular:  Negative for chest pain.  Neurological:  Positive for dizziness and headaches (endorses daily headache x 1 year.).        Objective:    BP (!) 148/70   Pulse 76   SpO2 99%    Physical Exam Constitutional:      General: She is not in acute distress. HENT:     Head: Normocephalic.     Right Ear: External ear normal. Tenderness present. There is no impacted cerumen. Tympanic membrane is erythematous.     Left Ear: External ear normal. There is no impacted cerumen. Tympanic membrane is erythematous.     Mouth/Throat:     Mouth: Mucous membranes are moist.     Pharynx: No oropharyngeal exudate or posterior oropharyngeal erythema.  Cardiovascular:     Rate and Rhythm: Normal rate and regular rhythm.     Heart sounds: Normal heart sounds.  Pulmonary:     Effort: Pulmonary effort is normal.     Breath sounds: Normal breath sounds.  Neurological:     General: No focal deficit present.     Mental Status: She is alert and oriented to person, place, and time.     No results found for any visits on 11/08/22.      Assessment & Plan:   Problem List Items Addressed This Visit   None Visit  Diagnoses     Acute otitis media, bilateral    -  Primary   Relevant Medications   amoxicillin-clavulanate (AUGMENTIN) 875-125 MG tablet     Suspect dizziness is secondary to ear infection. Plan to treat with antibiotics. Can also consider restarting flonase and daily antihistamine for seasonal allergies. Patient instructed to follow-up if symptoms worsen or fail to improve.  Vitals stable, except BP which was elevated today. Recommended checking BP daily at home to monitor readings.  Plan to call patient to follow-up on symptoms next week.   Meds ordered this encounter  Medications   amoxicillin-clavulanate (AUGMENTIN) 875-125 MG tablet    Sig: Take 1 tablet by mouth 2 (two) times daily.    Dispense:  20 tablet    Refill:  0    Order Specific Question:   Supervising Provider    Answer:   Erasmo Downer [[id number]]    As needed.   Gloris Ham, NP

## 2022-11-13 ENCOUNTER — Telehealth: Payer: Self-pay | Admitting: Nurse Practitioner

## 2022-11-13 NOTE — Telephone Encounter (Signed)
Called patient to follow-up on symptoms. Reports slight improvement in symptoms. BP has been stable, last reading was 119/81. Patient instructed to follow-up as needed.

## 2022-12-12 ENCOUNTER — Other Ambulatory Visit: Payer: Self-pay | Admitting: Physician Assistant

## 2022-12-12 ENCOUNTER — Ambulatory Visit
Admission: RE | Admit: 2022-12-12 | Discharge: 2022-12-12 | Disposition: A | Discharge status: Home / Self Care | Payer: Self-pay | Source: Ambulatory Visit | Attending: Physician Assistant | Admitting: Physician Assistant

## 2022-12-12 DIAGNOSIS — Z1231 Encounter for screening mammogram for malignant neoplasm of breast: Secondary | ICD-10-CM

## 2022-12-20 ENCOUNTER — Encounter: Payer: Self-pay | Admitting: Nurse Practitioner

## 2022-12-20 ENCOUNTER — Ambulatory Visit: Payer: BLUE CROSS/BLUE SHIELD | Admitting: Nurse Practitioner

## 2022-12-20 VITALS — BP 148/85 | HR 82

## 2022-12-20 DIAGNOSIS — T23172A Burn of first degree of left wrist, initial encounter: Secondary | ICD-10-CM

## 2022-12-20 DIAGNOSIS — H6693 Otitis media, unspecified, bilateral: Secondary | ICD-10-CM

## 2022-12-20 MED ORDER — CIPROFLOXACIN-DEXAMETHASONE 0.3-0.1 % OT SUSP: 4.0000 [drp] | Freq: Two times a day (BID) | OTIC | 0 refills | Status: AC

## 2022-12-20 NOTE — Progress Notes (Signed)
Acute Office Visit  Subjective:     Patient ID: Brandi Davenport, female    DOB: 12-24-1955, 67 y.o.   MRN: [id number]  Chief Complaint  Patient presents with   rash and ear pain     HPI Patient is in today for:  Right ear pain- Patient reports significant improvement in symptoms since treatment of bilateral acute otitis media 11/07/32; however still reports aching of right inner ear and itching of left inner ear. She was previously treated with amoxicillin and reports she completed the course of antibiotics. Denies dizziness at this time.    Left forearm/ wrist pain: started last night. Was cooking and removed pot form stove. Reports hot steam hit wrist and now has pain. She reports putting butter on the burn after it occurred.    BP slightly elevated today. States she tends to get nervous when she is sick which causes her BP to ris e.  Review of Systems  HENT:  Positive for ear pain. Negative for ear discharge, hearing loss and tinnitus.   Skin:        Pain and burning of left wrist.         Objective:    BP (!) 148/85   Pulse 82   SpO2 96%    Physical Exam Constitutional:      General: She is not in acute distress. HENT:     Right Ear: External ear normal.     Left Ear: External ear normal.     Ears:     Comments: Right TM and canal with slight eyrthema Pulmonary:     Effort: Pulmonary effort is normal.  Skin:    Findings: Burn present.          Comments: Swelling and redness to site. No drainage or blisters noted.   Neurological:     Mental Status: She is alert.     No results found for any visits on 12/20/22.      Assessment & Plan:   Problem List Items Addressed This Visit   None Visit Diagnoses     Acute otitis media, bilateral    -  Primary   Relevant Medications   ciprofloxacin-dexamethasone (CIPRODEX) OTIC suspension Due to ongoing symptoms will treat with ciprodex. Follow-up if symptoms fail to improve.     First degree burn of left  wrist, initial encounter      Discussed management of burns. Recommend cool water compress and use of otc aloe vera burn relief.     Meds ordered this encounter  Medications   ciprofloxacin-dexamethasone (CIPRODEX) OTIC suspension    Sig: Place 4 drops into both ears 2 (two) times daily for 7 days.    Dispense:  7.5 mL    Refill:  0    Order Specific Question:   Supervising Provider    Answer:   Erasmo Downer [[id number]]    Return if symptoms worsen or fail to improve.  Gloris Ham, NP

## 2023-01-01 ENCOUNTER — Other Ambulatory Visit: Payer: Self-pay | Admitting: Family Medicine

## 2023-01-01 ENCOUNTER — Ambulatory Visit: Payer: Self-pay

## 2023-01-01 DIAGNOSIS — M25561 Pain in right knee: Secondary | ICD-10-CM

## 2023-02-14 ENCOUNTER — Encounter: Payer: Self-pay | Admitting: Nurse Practitioner

## 2023-02-14 ENCOUNTER — Ambulatory Visit: Payer: BLUE CROSS/BLUE SHIELD | Admitting: Nurse Practitioner

## 2023-02-14 VITALS — BP 130/78 | HR 81

## 2023-02-14 DIAGNOSIS — M545 Low back pain, unspecified: Secondary | ICD-10-CM

## 2023-02-14 MED ORDER — BACLOFEN 10 MG PO TABS: 5.0000 mg | ORAL_TABLET | Freq: Three times a day (TID) | ORAL | 0 refills | Status: DC

## 2023-02-14 MED ORDER — BACLOFEN 10 MG PO TABS: 5.0000 mg | ORAL_TABLET | Freq: Three times a day (TID) | ORAL | 0 refills | Status: DC | PRN

## 2023-02-14 NOTE — Progress Notes (Signed)
Acute Office Visit  Subjective:     Patient ID: Brandi Davenport, female    DOB: Oct 09, 1955, 66 y.o.   MRN: [id number]  Chief Complaint  Patient presents with   Back Pain   Patient with PMH significant for DDD of lumbar spine is in today for c/o back pain.  Reports pain started last Saturday August 31. 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 States pain started shortly after coming home. Reports pain is 10/10. Describes as a burning sensation, endorses constant bilateral lower back pain.  Has been taking tylenol arthritis and extra strength tylenol every 4 hours. Also uses heating pack, biofreeze, and icy hot.  Previously on NSAIDs but reports this was stopped due to stomach issues (hx of ulcers.)  Denies numbness or pain in legs. Denies weakness. Endorses some chronic right knee pain after fall in April 2024, workers comp case.    Review of Systems  Musculoskeletal:  Positive for back pain.  Neurological:  Negative for tingling and weakness.      Objective:    BP 130/78   Pulse 81   SpO2 98%    Physical Exam Constitutional:      General: She is not in acute distress. Cardiovascular:     Rate and Rhythm: Normal rate and regular rhythm.     Heart sounds: Normal heart sounds.  Pulmonary:     Effort: Pulmonary effort is normal.     Breath sounds: Normal breath sounds.  Musculoskeletal:     Lumbar back: Tenderness (bilateral) present. No swelling, edema or signs of trauma. Decreased Davenport of motion.  Neurological:     Mental Status: She is alert.     No results found for any visits on 02/14/23.      Assessment & Plan:   Problem List Items Addressed This Visit   None Visit Diagnoses     Acute bilateral low back pain without sciatica    -  Primary   Relevant Medications   baclofen (LIORESAL) 10 MG tablet     Educated on limiting tylenol to no more than 3g daily. Will try baclofen 5mg  TID PRN. Can also try lidocaine patch to site. Will call  her to follow-up Tuesday.  Discussed red flag symptoms and when to seek care.  Meds ordered this encounter  Medications   DISCONTD: baclofen (LIORESAL) 10 MG tablet    Sig: Take 0.5 tablets (5 mg total) by mouth 3 (three) times daily.    Dispense:  30 each    Refill:  0    Order Specific Question:   Supervising Provider    Answer:   Erasmo Downer [[id number]]   baclofen (LIORESAL) 10 MG tablet    Sig: Take 0.5 tablets (5 mg total) by mouth 3 (three) times daily as needed for muscle spasms.    Dispense:  30 each    Refill:  0    Order Specific Question:   Supervising Provider    Answer:   Erasmo Downer [[id number]]     As needed.   Gloris Ham, NP

## 2023-02-19 ENCOUNTER — Telehealth: Payer: Self-pay | Admitting: Nurse Practitioner

## 2023-02-19 NOTE — Telephone Encounter (Signed)
Called to follow-up on lumbar back pain. No answer, left message requesting call back.

## 2023-03-26 ENCOUNTER — Other Ambulatory Visit: Payer: Self-pay | Admitting: Family Medicine

## 2023-03-26 DIAGNOSIS — M25561 Pain in right knee: Secondary | ICD-10-CM

## 2023-06-21 ENCOUNTER — Ambulatory Visit
Admission: RE | Admit: 2023-06-21 | Discharge: 2023-06-21 | Disposition: A | Discharge status: Home / Self Care | Payer: Worker's Compensation | Source: Ambulatory Visit | Attending: Family Medicine | Admitting: Family Medicine

## 2023-06-21 DIAGNOSIS — M25561 Pain in right knee: Secondary | ICD-10-CM

## 2023-08-13 ENCOUNTER — Ambulatory Visit: Admitting: Nurse Practitioner

## 2023-08-13 ENCOUNTER — Encounter: Payer: Self-pay | Admitting: Nurse Practitioner

## 2023-08-13 VITALS — BP 118/80 | HR 89 | Temp 97.4°F

## 2023-08-13 DIAGNOSIS — J0140 Acute pansinusitis, unspecified: Secondary | ICD-10-CM

## 2023-08-13 MED ORDER — AMOXICILLIN-POT CLAVULANATE 875-125 MG PO TABS: 1.0000 | ORAL_TABLET | Freq: Two times a day (BID) | ORAL | 0 refills | Status: DC

## 2023-08-13 NOTE — Progress Notes (Signed)
 Acute Office Visit  Subjective:     Patient ID: Brandi Davenport, female    DOB: March 10, 1956, 68 y.o.   MRN: [id number]  Chief Complaint  Patient presents with   Headache    Patient is in today for sinus pressure/ headache for 2 weeks. Reports pain around eyes, nose with a lot of drainage. Has nausea due to pain.  No fevers, chills. Denies cough, sore throat, or chest pain   Has tried tylenol at home and cold packs with no relief.   Review of Systems  Constitutional:  Negative for chills and fever.  HENT:  Positive for congestion and sinus pain. Negative for ear pain, hearing loss and sore throat.   Eyes:  Positive for blurred vision (with headaches.).  Respiratory:  Negative for cough, sputum production and shortness of breath.   Cardiovascular:  Negative for chest pain.  Gastrointestinal:  Positive for nausea (with HA).  Neurological:  Positive for headaches.        Objective:    BP 118/80 (BP Location: Left Arm, Patient Position: Sitting, Cuff Size: Normal)   Pulse 89   Temp (!) 97.4 F (36.3 C)   SpO2 98%    Physical Exam Constitutional:      General: She is not in acute distress. HENT:     Head: Normocephalic.     Right Ear: Tympanic membrane, ear canal and external ear normal.     Left Ear: Tympanic membrane and ear canal normal.     Nose: No congestion.     Right Sinus: Maxillary sinus tenderness and frontal sinus tenderness present.     Left Sinus: Maxillary sinus tenderness and frontal sinus tenderness present.     Mouth/Throat:     Pharynx: No oropharyngeal exudate or posterior oropharyngeal erythema.  Eyes:     Pupils: Pupils are equal, round, and reactive to light.  Cardiovascular:     Rate and Rhythm: Normal rate and regular rhythm.     Heart sounds: Normal heart sounds.  Pulmonary:     Effort: Pulmonary effort is normal.     Breath sounds: Normal breath sounds.  Musculoskeletal:        General: Normal Davenport of motion.     Cervical back: No  rigidity.  Neurological:     Mental Status: She is alert and oriented to person, place, and time.  Psychiatric:        Mood and Affect: Mood normal.     No results found for any visits on 08/13/23.      Assessment & Plan:   Problem List Items Addressed This Visit   None Visit Diagnoses       Acute non-recurrent pansinusitis    -  Primary   Relevant Medications   amoxicillin-clavulanate (AUGMENTIN) 875-125 MG tablet  Will go ahead and treat with abx. Also recommended supportive care measures including use of flonase daily and mucinex.     Meds ordered this encounter  Medications   amoxicillin-clavulanate (AUGMENTIN) 875-125 MG tablet    Sig: Take 1 tablet by mouth 2 (two) times daily.    Dispense:  20 tablet    Refill:  0    Supervising Provider:   Erasmo Downer [[id number]]    Follow-up if symptoms fail to improve.   Gloris Ham, NP

## 2023-09-04 ENCOUNTER — Encounter (HOSPITAL_COMMUNITY): Payer: Self-pay

## 2023-09-04 ENCOUNTER — Ambulatory Visit (HOSPITAL_COMMUNITY)
Admission: EM | Admit: 2023-09-04 | Discharge: 2023-09-04 | Disposition: A | Discharge status: Home / Self Care | Attending: Emergency Medicine | Admitting: Emergency Medicine

## 2023-09-04 DIAGNOSIS — M5441 Lumbago with sciatica, right side: Secondary | ICD-10-CM | POA: Diagnosis not present

## 2023-09-04 DIAGNOSIS — G8929 Other chronic pain: Secondary | ICD-10-CM

## 2023-09-04 MED ORDER — DEXAMETHASONE SODIUM PHOSPHATE 10 MG/ML IJ SOLN
10.0000 mg | Freq: Once | INTRAMUSCULAR | Status: AC
  Administered 2023-09-04: 10 mg via INTRAMUSCULAR

## 2023-09-04 MED ORDER — DEXAMETHASONE SODIUM PHOSPHATE 10 MG/ML IJ SOLN
INTRAMUSCULAR | Status: AC
  Filled 2023-09-04: qty 1

## 2023-09-04 NOTE — Discharge Instructions (Addendum)
 We have given you a steroid injection in clinic to help with your low back pain.  Your symptoms are consistent with sciatica.  Please follow-up with an orthopedic for further evaluation and intervention, they may be able to offer physical therapy which has been shown to be beneficial.  You can alternate between 800 mg of ibuprofen and 500 mg of Tylenol every 4-6 hours for any breakthrough pain.  Seek immediate care for any numbness, inner leg numbness, incontinence, or new concerning symptoms at the nearest emergency department for further evaluation and advanced imaging.

## 2023-09-04 NOTE — ED Triage Notes (Signed)
 Patient c/o right lower back pain that radiates down the right leg. X 2 days.  Patient denies any injuries or heavy lifting.  Patient has been taking Tylenol for pain with very little relief.

## 2023-09-04 NOTE — ED Provider Notes (Signed)
 [facility]    CSN: [id number] Arrival date & time: 09/04/23  6962      History   Chief Complaint Chief Complaint  Patient presents with   Back Pain    HPI Brandi Davenport is a 68 y.o. female.   Patient presents to clinic over concern of chronic low back pain with numbness and tingling heading down to her right butt cheek, right thigh and ending in her right posterior knee.  She has had chronic low back pain since a fall in 2024.  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 Pain in right glute started on three days ago and has been worsening.  Her low back pain and right sided leg pain have been so severe that she had to call out of work today.  She is unable to walk without pain.  She has tried Tylenol without any relief.  Denies any inner leg numbness, recent trauma, falls or any new injuries.  Without incontinence.  History of DM2, last A1c drawn in December was 7.2, controlled on glipizide.  The history is provided by the patient and medical records.  Back Pain   Past Medical History:  Diagnosis Date   Diabetes mellitus without complication (HCC)    Hyperlipidemia    Hypertension     Patient Active Problem List   Diagnosis Date Noted   Tendinitis of right shoulder 05/26/2018   Intertrigo 05/26/2018   Swelling 03/01/2018   Finger pain, right 03/01/2018   Latex allergy 09/19/2017   Encounter for screening mammogram for breast cancer 09/17/2017   Back pain without sciatica 09/17/2017   Carpal tunnel syndrome of right wrist 06/27/2017   SOB (shortness of breath) on exertion 05/10/2017   Diabetic neuropathy (HCC) 10/03/2016   Constipation 11/04/2015   Grief reaction 05/12/2015   Generalized anxiety disorder 10/11/2014   Major depressive disorder, single episode 01/10/2010   GERD (gastroesophageal reflux disease) 02/25/2008    HYPERLIPIDEMIA 03/13/2007   DM type 2 (diabetes mellitus, type 2) (HCC) 08/08/2006   HYPERTENSION, BENIGN SYSTEMIC 08/08/2006    Past Surgical History:  Procedure Laterality Date   ABDOMINAL HYSTERECTOMY     tooth removal     TUBAL LIGATION      OB History   No obstetric history on file.      Home Medications    Prior to Admission medications   Medication Sig Start Date End Date Taking? Authorizing Provider  aspirin 81 MG EC tablet Take 81 mg by mouth daily.      [provider]  Blood Glucose Monitoring Suppl (ONE TOUCH ULTRA 2) W/DEVICE KIT 2 mg by Does not apply route daily. 01/26/15   Myra Rude, MD  diltiazem (CARDIZEM CD) 180 MG 24 hr capsule Take 180 mg by mouth daily.    [provider]  glipiZIDE (GLUCOTROL XL) 10 MG 24 hr tablet Take 1 tablet by mouth daily. 02/28/22   [provider]  glucose blood (ONE TOUCH ULTRA TEST) test strip Use as instructed 01/26/15   Myra Rude, MD  hydrochlorothiazide (HYDRODIURIL) 25 MG tablet TAKE 1/2 (ONE-HALF) TABLET BY MOUTH DAILY 06/09/18   Myrene Buddy, MD  ketoconazole (NIZORAL) 2 % cream Apply 1 application topically daily. 05/23/18   Myrene Buddy, MD  Lancets [facility] ULTRASOFT) lancets Use as instructed 01/26/15   Myra Rude, MD  lisinopril (PRINIVIL,ZESTRIL) 40 MG  tablet Take 1 tablet (40 mg total) by mouth daily. 11/29/17   Casey Burkitt, MD  omeprazole (PRILOSEC) 40 MG capsule Take by mouth. 02/28/22   [provider]  rosuvastatin (CRESTOR) 10 MG tablet Take 10 mg by mouth daily.    [provider]    Family History Family History  Problem Relation Age of Onset   Heart attack Father     Social History Social History   Tobacco Use   Smoking status: Never   Smokeless tobacco: Never  Vaping Use   Vaping status: Never Used  Substance Use Topics   Alcohol use: No   Drug use: No     Allergies   Codeine phosphate, Gabapentin, Metformin,  Tramadol, and Latex   Review of Systems Review of Systems  Per HPI  Physical Exam Triage Vital Signs ED Triage Vitals [09/04/23 1046]  Encounter Vitals Group     BP 129/79     Systolic BP Percentile      Diastolic BP Percentile      Pulse Rate 77     Resp 16     Temp 98.2 F (36.8 C)     Temp Source Oral     SpO2 99 %     Weight      Height      Head Circumference      Peak Flow      Pain Score 10     Pain Loc      Pain Education      Exclude from Growth Chart    No data found.  Updated Vital Signs BP 129/79 (BP Location: Right Arm)   Pulse 77   Temp 98.2 F (36.8 C) (Oral)   Resp 16   SpO2 99%   Visual Acuity Right Eye Distance:   Left Eye Distance:   Bilateral Distance:    Right Eye Near:   Left Eye Near:    Bilateral Near:     Physical Exam Vitals and nursing note reviewed.  Constitutional:      Appearance: Normal appearance. She is obese.  HENT:     Head: Normocephalic and atraumatic.     Right Ear: External ear normal.     Left Ear: External ear normal.     Nose: Nose normal.     Mouth/Throat:     Mouth: Mucous membranes are moist.  Eyes:     Conjunctiva/sclera: Conjunctivae normal.  Cardiovascular:     Rate and Rhythm: Normal rate.  Pulmonary:     Effort: Pulmonary effort is normal. No respiratory distress.  Musculoskeletal:        General: Tenderness present.     Lumbar back: Tenderness present. Positive right straight leg raise test.       Back:     Comments: Right sided gluteal TTP, positive R SLR.  Spine without step-off, crepitus or deformity.  Atraumatic.  Skin:    General: Skin is warm and dry.     Findings: No rash.  Neurological:     General: No focal deficit present.     Mental Status: She is alert.  Psychiatric:        Mood and Affect: Mood normal.      UC Treatments / Results  Labs (all labs ordered are listed, but only abnormal results are displayed) Labs Reviewed - No data to display  EKG   Radiology No  results found.  Procedures Procedures (including critical care time)  Medications Ordered in UC Medications  dexamethasone (DECADRON) injection 10 mg (has no administration in time range)    Initial Impression / Assessment and Plan / UC Course  I have reviewed the triage vital signs and the nursing notes.  Pertinent labs & imaging results that were available during my care of the patient were reviewed by me and considered in my medical decision making (see chart for details).  Vitals and triage reviewed, patient is hemodynamically stable.  Atraumatic, chronic back pain.  Imaging deferred, recent imaging as of 11/24. Spine without step-off, deformity.  Without concern for cauda equina, doubt incontinence or neck numbness.  Right-sided gluteal tenderness to palpation, no rashes.  Positive right-sided straight leg raise.  Symptoms concerning for sciatica, will treat with IM Decadron.  Encouraged orthopedic follow-up for further evaluation and intervention.  Plan of care, follow-up care return precautions given, no questions at this time.  Work note provided.     Final Clinical Impressions(s) / UC Diagnoses   Final diagnoses:  Chronic bilateral low back pain with right-sided sciatica     Discharge Instructions      We have given you a steroid injection in clinic to help with your low back pain.  Your symptoms are consistent with sciatica.  Please follow-up with an orthopedic for further evaluation and intervention, they may be able to offer physical therapy which has been shown to be beneficial.  You can alternate between 800 mg of ibuprofen and 500 mg of Tylenol every 4-6 hours for any breakthrough pain.  Seek immediate care for any numbness, inner leg numbness, incontinence, or new concerning symptoms at the nearest emergency department for further evaluation and advanced imaging.     ED Prescriptions   None    PDMP not reviewed this encounter.   Preslei Blakley, [place] N,  [place] 09/04/23 [phone]

## 2023-10-08 ENCOUNTER — Encounter (HOSPITAL_COMMUNITY): Payer: Self-pay

## 2023-10-08 ENCOUNTER — Other Ambulatory Visit: Payer: Self-pay

## 2023-10-08 ENCOUNTER — Ambulatory Visit (HOSPITAL_COMMUNITY)
Admission: EM | Admit: 2023-10-08 | Discharge: 2023-10-08 | Disposition: A | Discharge status: Home / Self Care | Attending: Family Medicine | Admitting: Family Medicine

## 2023-10-08 DIAGNOSIS — R42 Dizziness and giddiness: Secondary | ICD-10-CM

## 2023-10-08 DIAGNOSIS — Z8669 Personal history of other diseases of the nervous system and sense organs: Secondary | ICD-10-CM

## 2023-10-08 DIAGNOSIS — R519 Headache, unspecified: Secondary | ICD-10-CM

## 2023-10-08 LAB — POCT FASTING CBG KUC MANUAL ENTRY: POCT Glucose (KUC): 91 mg/dL (ref 70–99)

## 2023-10-08 MED ORDER — KETOROLAC TROMETHAMINE 30 MG/ML IJ SOLN
INTRAMUSCULAR | Status: AC
Start: 2023-10-08 — End: ?
  Filled 2023-10-08: qty 1

## 2023-10-08 MED ORDER — KETOROLAC TROMETHAMINE 30 MG/ML IJ SOLN
15.0000 mg | Freq: Once | INTRAMUSCULAR | Status: AC
  Administered 2023-10-08: 15 mg via INTRAMUSCULAR

## 2023-10-08 MED ORDER — AMOXICILLIN 875 MG PO TABS: 875.0000 mg | ORAL_TABLET | Freq: Two times a day (BID) | ORAL | 0 refills | Status: AC

## 2023-10-08 NOTE — ED Triage Notes (Signed)
 Pt states that at the beginning of March she has been having dizziness, a headache on the right side of the head, and watery eyes. Pt has taken OTC Tylenol  at home. Pt currently rates her pain a 8/10.

## 2023-10-08 NOTE — Discharge Instructions (Addendum)
 Call your eye doctor to schedule a follow up in order to have a thorough eye exam and to hopefully determine what may be causing your episodic blurry vision.  Meds ordered this encounter  Medications   ketorolac  (TORADOL ) 30 MG/ML injection 15 mg   amoxicillin  (AMOXIL ) 875 MG tablet    Sig: Take 1 tablet (875 mg total) by mouth 2 (two) times daily for 10 days.    Dispense:  20 tablet    Refill:  0

## 2023-10-09 NOTE — ED Provider Notes (Signed)
 [facility]   [id number] 10/08/23 Arrival Time: 1450  ASSESSMENT & PLAN:  1. Nonspecific dizziness   2. Frontal headache   3. History of blurry vision    Possibility of sinus infection causing HA is plausible. Will tx as such. Normal neurological exam.  Meds ordered this encounter  Medications   ketorolac  (TORADOL ) 30 MG/ML injection 15 mg   amoxicillin  (AMOXIL ) 875 MG tablet    Sig: Take 1 tablet (875 mg total) by mouth 2 (two) times daily for 10 days.    Dispense:  20 tablet    Refill:  0   She has seen eye doctor in the past two years. Cannot remember whom but she has records at home. Recommend scheduling appt to evaluate her R eye episodic blurriness.  If headache does not improve, recommend:  Follow-up Information     Schedule an appointment as soon as possible for a visit  with Podraza, Ammon Bales, PA-C.   Specialty: Physician Assistant Why: For follow up. Contact information: [id number] [place] [address] [phone]                ED if abrupt worsening of any of her current complaints/symptoms; she voices understanding.  Reviewed expectations re: course of current medical issues. Questions answered. Outlined signs and symptoms indicating need for more acute intervention. Patient verbalized understanding. After Visit Summary given.   SUBJECTIVE: History from: patient.  Brandi Davenport is a 68 y.o. female who presents with complaint of nasal congestion, post-nasal drainage, and frontal headache. Past several weeks. Denies n/v/hearing changes. Denies any extremity weakness or speech abnormalcies. Denies gait problems. Does reports occasional "blurriness" of her R eye but does not associate that with current HA. Denies current visual disturbance. Denies head injury.  Social History   Tobacco Use  Smoking Status Never  Smokeless Tobacco Never    OBJECTIVE:  Vitals:   10/08/23 1530  BP: 121/76  Pulse: 70   Resp: 18  Temp: 98.2 F (36.8 C)  TempSrc: Oral  SpO2: 98%     General appearance: alert; no distress HEENT: nasal congestion; clear runny nose; throat irritation secondary to post-nasal drainage; bilateral maxillary tenderness to palpation; turbinates boggy Neck: supple without LAD; trachea midline Lungs: unlabored respirations, symmetrical air entry; cough: absent; no respiratory distress Skin: warm and dry Neuro: CN 2-12 grossly intact; normal extremity strength and movement and sensation Psychological: alert and cooperative; normal mood and affect  Allergies  Allergen Reactions   Codeine  Phosphate Other (See Comments)    REACTION: unspecified   Gabapentin  Nausea And Vomiting and Nausea Only   Metformin  Other (See Comments)    GI upset   GI upset   Tramadol  Other (See Comments)    Chest pain   Latex Rash    Past Medical History:  Diagnosis Date   Diabetes mellitus without complication (HCC)    Hyperlipidemia    Hypertension    Family History  Problem Relation Age of Onset   Heart attack Father    Social History   Socioeconomic History   Marital status: Legally Separated    Spouse name: Not on file   Number of children: Not on file   Years of education: Not on file   Highest education level: Not on file  Occupational History   Not on file  Tobacco Use   Smoking status: Never   Smokeless tobacco: Never  Vaping Use   Vaping status: Never Used  Substance and Sexual Activity  Alcohol use: No   Drug use: No   Sexual activity: Not on file  Other Topics Concern   Not on file  Social History Narrative   Not on file   Social Drivers of Health   Financial Resource Strain: Not on file  Food Insecurity: Not on file  Transportation Needs: Not on file  Physical Activity: Not on file  Stress: Not on file  Social Connections: Not on file  Intimate Partner Violence: Not on file             Afton Albright, MD 10/09/23 [phone]

## 2024-02-06 ENCOUNTER — Ambulatory Visit: Admitting: Nurse Practitioner

## 2024-02-06 ENCOUNTER — Encounter: Payer: Self-pay | Admitting: Nurse Practitioner

## 2024-02-06 VITALS — BP 106/64 | HR 80 | Temp 97.2°F

## 2024-02-06 DIAGNOSIS — J0110 Acute frontal sinusitis, unspecified: Secondary | ICD-10-CM

## 2024-02-06 MED ORDER — BENZONATATE 100 MG PO CAPS: 100.0000 mg | ORAL_CAPSULE | Freq: Two times a day (BID) | ORAL | 0 refills | Status: AC | PRN

## 2024-02-06 MED ORDER — FLUTICASONE PROPIONATE 50 MCG/ACT NA SUSP: 2.0000 | Freq: Two times a day (BID) | NASAL | 0 refills | Status: AC | PRN

## 2024-02-06 NOTE — Progress Notes (Signed)
 Acute Office Visit  Subjective:     Patient ID: Brandi Davenport, female    DOB: [birth year], 68 y.o.   MRN: [id number]  Chief Complaint  Patient presents with   Sinus Problem    Patient is in today for c/o of sinus issues x 1 week.  Symtpoms started last Wednesday. Reports she was very sick, had chills, frontal headache/pressure, and dizziness. Felt weak and was in bed x 3 days. Symtpoms improved and she reurned to work Monday.  Currently experience sinus Drainage, green phelgm and coughing. Reports other symptoms have resvolved.  Has been treating symptoms with mucinex  and robitussin cough syrup.   Review of Systems  Constitutional:  Negative for chills and fever.  HENT:  Positive for congestion. Negative for ear pain, sinus pain and sore throat.   Respiratory:  Positive for cough and sputum production. Negative for shortness of breath.   Cardiovascular:  Negative for chest pain.  Gastrointestinal:  Negative for constipation, nausea and vomiting.  Musculoskeletal: Negative.   Neurological:  Negative for dizziness, weakness and headaches.        Objective:    BP 106/64 (BP Location: Left Arm, Patient Position: Sitting, Cuff Size: Normal)   Pulse 80   Temp (!) 97.2 F (36.2 C) (Temporal)   SpO2 98%    Physical Exam Constitutional:      General: She is not in acute distress. HENT:     Head: Normocephalic.     Right Ear: Tympanic membrane, ear canal and external ear normal.     Left Ear: Tympanic membrane, ear canal and external ear normal.     Nose:     Right Sinus: No maxillary sinus tenderness or frontal sinus tenderness.     Left Sinus: No frontal sinus tenderness.     Mouth/Throat:     Pharynx: No oropharyngeal exudate or posterior oropharyngeal erythema.  Eyes:     Pupils: Pupils are equal, round, and reactive to light.  Cardiovascular:     Rate and Rhythm: Normal rate and regular rhythm.     Heart sounds: Normal heart sounds.  Pulmonary:     Effort:  Pulmonary effort is normal.     Breath sounds: Normal breath sounds.  Musculoskeletal:        General: Normal range of motion.     Cervical back: Normal range of motion.  Lymphadenopathy:     Cervical: No cervical adenopathy.  Neurological:     General: No focal deficit present.     Mental Status: She is alert and oriented to person, place, and time.     No results found for any visits on 02/06/24.      Assessment & Plan:   Problem List Items Addressed This Visit   None Visit Diagnoses       Acute non-recurrent frontal sinusitis    -  Primary   Relevant Medications   benzonatate  (TESSALON ) 100 MG capsule   fluticasone  (FLONASE ) 50 MCG/ACT nasal spray  Symptoms are improving which is reassuring. Continue supportive measures in addition to tessalon  perles and mucinex .  Discussed red flag symptoms and when to sick care.      Meds ordered this encounter  Medications   benzonatate  (TESSALON ) 100 MG capsule    Sig: Take 1 capsule (100 mg total) by mouth 2 (two) times daily as needed for cough.    Dispense:  20 capsule    Refill:  0    Supervising Provider:   BACIGALUPO, ANGELA M [[id number]]  fluticasone  (FLONASE ) 50 MCG/ACT nasal spray    Sig: Place 2 sprays into both nostrils 2 (two) times daily as needed for allergies or rhinitis.    Dispense:  16 g    Refill:  0    Supervising Provider:   BACIGALUPO, ANGELA M [[id number]]   As needed.  Brandi Zenk Flores Victoria Euceda, NP

## 2024-02-13 LAB — COLOGUARD

## 2024-02-20 LAB — COLOGUARD

## 2024-04-02 ENCOUNTER — Ambulatory Visit: Admitting: Nurse Practitioner

## 2024-04-02 ENCOUNTER — Encounter: Payer: Self-pay | Admitting: Nurse Practitioner

## 2024-04-02 VITALS — BP 118/66 | HR 69 | Temp 97.6°F

## 2024-04-02 DIAGNOSIS — M26623 Arthralgia of bilateral temporomandibular joint: Secondary | ICD-10-CM

## 2024-04-02 NOTE — Progress Notes (Signed)
   Acute Office Visit  Subjective:     Patient ID: Brandi Davenport, female    DOB: 06-11-1956, 68 y.o.   MRN: [id number]  Chief Complaint  Patient presents with   Ear Pain    Tawsha presents to clinic today with complaints of her ears. States her ears are itchy and painful. Onset about 2 weeks ago. Feels like something is in her ears and constantly itches. Worse at night. She has tried ear drops but unsure of name. This was not helpful. This has happened before in the past and it went away. Pain does not radiate.    Review of Systems  Constitutional:  Negative for chills, fever and malaise/fatigue.  HENT:  Positive for ear pain. Negative for congestion, ear discharge, hearing loss, sore throat and tinnitus.   Respiratory:  Negative for shortness of breath.   Cardiovascular:  Negative for chest pain.        Objective:    BP 118/66 (BP Location: Left Arm, Patient Position: Sitting, Cuff Size: Large)   Pulse 69   Temp 97.6 F (36.4 C) (Oral)   SpO2 97%    Physical Exam Constitutional:      General: She is not in acute distress.    Appearance: Normal appearance.  HENT:     Head: Normocephalic and atraumatic.     Comments: No TTP behind BL ears. TTP just in front on BL tragus. Pain with wide open mouth. Pop felt intermittently with jaw opening and closing. Pain reproduced.     Right Ear: Tympanic membrane, ear canal and external ear normal.     Left Ear: Tympanic membrane, ear canal and external ear normal.     Nose: Nose normal. No congestion or rhinorrhea.     Mouth/Throat:     Mouth: Mucous membranes are moist.     Pharynx: Oropharynx is clear. No oropharyngeal exudate or posterior oropharyngeal erythema.  Eyes:     Conjunctiva/sclera: Conjunctivae normal.     Pupils: Pupils are equal, round, and reactive to light.  Pulmonary:     Effort: Pulmonary effort is normal.  Skin:    General: Skin is warm and dry.  Neurological:     Mental Status: She is alert.   Psychiatric:        Mood and Affect: Mood normal.        Thought Content: Thought content normal.     No results found for any visits on 04/02/24.      Assessment & Plan:   Problem List Items Addressed This Visit   None Visit Diagnoses       Bilateral temporomandibular joint pain    -  Primary      Discussed pain likely TMJ pain. Treat with ice/heat PRN, Tylenol  PRN. She is unable to tolerate NSAIDs due to history of ulcers. She will trial 10 mg Flexeril  which she has at home at night for the next few nights. Advised to chew soft foods and avoid exacerbating factors. Reassurance that ears are clear with no signs of infection. She may try daily Claritin for itchy ears.   Advised to seek medical care for worsening or changing symptoms at any time. If no improvement in the next 1-2 weeks, advised she schedule in clinic or with PCP.  No orders of the defined types were placed in this encounter.   Return if symptoms worsen or fail to improve.  Janeli Lewison E Ree Alcalde, FNP
